# Patient Record
Sex: Male | Born: 1940
Health system: Southern US, Community
[De-identification: ages and names within clinical notes are randomized; demographics above are authoritative.]

## PROBLEM LIST (undated history)

## (undated) DIAGNOSIS — C801 Malignant (primary) neoplasm, unspecified: Secondary | ICD-10-CM

## (undated) DIAGNOSIS — C8590 Non-Hodgkin lymphoma, unspecified, unspecified site: Secondary | ICD-10-CM

## (undated) DIAGNOSIS — I219 Acute myocardial infarction, unspecified: Secondary | ICD-10-CM

## (undated) DIAGNOSIS — I2510 Atherosclerotic heart disease of native coronary artery without angina pectoris: Secondary | ICD-10-CM

## (undated) DIAGNOSIS — E785 Hyperlipidemia, unspecified: Secondary | ICD-10-CM

## (undated) DIAGNOSIS — I517 Cardiomegaly: Secondary | ICD-10-CM

## (undated) DIAGNOSIS — IMO0001 Reserved for inherently not codable concepts without codable children: Secondary | ICD-10-CM

## (undated) DIAGNOSIS — I10 Essential (primary) hypertension: Secondary | ICD-10-CM

## (undated) DIAGNOSIS — R011 Cardiac murmur, unspecified: Secondary | ICD-10-CM

## (undated) DIAGNOSIS — K219 Gastro-esophageal reflux disease without esophagitis: Secondary | ICD-10-CM

## (undated) DIAGNOSIS — N486 Induration penis plastica: Secondary | ICD-10-CM

## (undated) DIAGNOSIS — I700 Atherosclerosis of aorta: Secondary | ICD-10-CM

## (undated) DIAGNOSIS — C61 Malignant neoplasm of prostate: Secondary | ICD-10-CM

## (undated) DIAGNOSIS — H269 Unspecified cataract: Secondary | ICD-10-CM

## (undated) DIAGNOSIS — Z9289 Personal history of other medical treatment: Secondary | ICD-10-CM

## (undated) HISTORY — DX: Atherosclerosis of aorta: I70.0

## (undated) HISTORY — PX: TONSILLECTOMY: SUR1361

## (undated) HISTORY — PX: HEMORROIDECTOMY: SUR656

## (undated) HISTORY — DX: Personal history of other medical treatment: Z92.89

## (undated) HISTORY — DX: Induration penis plastica: N48.6

## (undated) HISTORY — DX: Unspecified cataract: H26.9

## (undated) HISTORY — DX: Hyperlipidemia, unspecified: E78.5

## (undated) HISTORY — PX: OTHER SURGICAL HISTORY: SHX169

## (undated) HISTORY — DX: Atherosclerotic heart disease of native coronary artery without angina pectoris: I25.10

## (undated) HISTORY — DX: Essential (primary) hypertension: I10

## (undated) HISTORY — DX: Acute myocardial infarction, unspecified: I21.9

## (undated) HISTORY — DX: Reserved for inherently not codable concepts without codable children: IMO0001

## (undated) HISTORY — DX: Cardiomegaly: I51.7

## (undated) HISTORY — DX: Malignant neoplasm of prostate: C61

## (undated) HISTORY — DX: Non-Hodgkin lymphoma, unspecified, unspecified site: C85.90

---

## 1990-08-24 HISTORY — PX: CARDIAC CATHETERIZATION: SHX172

## 1996-08-24 DIAGNOSIS — I219 Acute myocardial infarction, unspecified: Secondary | ICD-10-CM

## 1996-08-24 HISTORY — DX: Acute myocardial infarction, unspecified: I21.9

## 1996-08-24 HISTORY — PX: CORONARY ANGIOPLASTY WITH STENT PLACEMENT: SHX49

## 2002-08-24 HISTORY — PX: HERNIA REPAIR: SHX51

## 2008-12-11 ENCOUNTER — Ambulatory Visit: Admission: RE | Admit: 2008-12-11 | Discharge: 2009-01-29 | Payer: Self-pay | Admitting: Radiation Oncology

## 2008-12-19 ENCOUNTER — Ambulatory Visit (HOSPITAL_COMMUNITY): Admission: RE | Admit: 2008-12-19 | Discharge: 2008-12-19 | Payer: Self-pay | Admitting: Urology

## 2009-02-20 ENCOUNTER — Ambulatory Visit: Admission: RE | Admit: 2009-02-20 | Discharge: 2009-05-21 | Payer: Self-pay | Admitting: Radiation Oncology

## 2009-03-04 ENCOUNTER — Encounter: Admission: RE | Admit: 2009-03-04 | Discharge: 2009-03-04 | Payer: Self-pay | Admitting: Urology

## 2009-03-24 HISTORY — PX: PROSTATE SURGERY: SHX751

## 2009-04-03 ENCOUNTER — Ambulatory Visit (HOSPITAL_BASED_OUTPATIENT_CLINIC_OR_DEPARTMENT_OTHER): Admission: RE | Admit: 2009-04-03 | Discharge: 2009-04-03 | Payer: Self-pay | Admitting: Urology

## 2009-04-06 ENCOUNTER — Emergency Department: Payer: Self-pay | Admitting: Internal Medicine

## 2009-08-24 HISTORY — PX: EYE SURGERY: SHX253

## 2010-07-16 ENCOUNTER — Ambulatory Visit: Payer: Self-pay | Admitting: Ophthalmology

## 2010-08-06 ENCOUNTER — Ambulatory Visit: Payer: Self-pay | Admitting: Ophthalmology

## 2010-08-06 HISTORY — PX: CATARACT EXTRACTION: SUR2

## 2010-11-29 LAB — COMPREHENSIVE METABOLIC PANEL
AST: 21 U/L (ref 0–37)
Albumin: 3.8 g/dL (ref 3.5–5.2)
Calcium: 9.6 mg/dL (ref 8.4–10.5)
Chloride: 107 mEq/L (ref 96–112)
Creatinine, Ser: 1.02 mg/dL (ref 0.4–1.5)
GFR calc Af Amer: >60 mL/min (ref >60)
Sodium: 139 mEq/L (ref 135–145)

## 2010-11-29 LAB — CBC
HCT: 42.7 % (ref 39.0–52.0)
Hemoglobin: 14.2 g/dL (ref 13.0–17.0)
MCHC: 33.2 g/dL (ref 30.0–36.0)
Platelets: 227 10*3/uL (ref 150–400)
RBC: 4.44 MIL/uL (ref 4.22–5.81)

## 2010-11-29 LAB — APTT: aPTT: 24 seconds (ref 24–37)

## 2011-01-06 NOTE — Op Note (Signed)
NAME:  Elijah Peters, Elijah Peters             ACCOUNT NO.:  [id number]   MEDICAL RECORD NO.:  [id number]          PATIENT TYPE:  AMB   LOCATION:  [facility]                         FACILITY:  [facility]   PHYSICIAN:  Phelan L. Earlene Plater, M.D.  DATE OF BIRTH:  [birth year]   DATE OF PROCEDURE:  04/03/2009  DATE OF DISCHARGE:                               OPERATIVE REPORT   PREOPERATIVE DIAGNOSIS:  Adenocarcinoma of prostate.   POSTOPERATIVE DIAGNOSIS:  Adenocarcinoma of prostate.   OPERATIVE PROCEDURE:  Transperineal implantation of iodine 125 seed  implants, seat with robotic arm Nucletron, cystourethroscopy and removal  of 2 seeds.   SURGEON:  Lucrezia Starch. Earlene Plater, M.D.   ASSISTANT:  Artist Pais. Kathrynn Running, M.D.   ANESTHESIA:  LMA.   BLOOD LOSS:  20 mL.   TUBES:  16-French Foley.   COMPLICATIONS:  None.   A total of 96 seeds were implanted and 2 removed for a total of 94 seeds  remaining in situ with 29 needles and 62.1120 mCi total apparent  activity.   INDICATIONS FOR PROCEDURE:  [name] is a very nice 70 year old  white male who had localized low risk prostate cancer.  He underwent a  clinical trial on [id number].  It was dutasteride versus placebo.  His exit biopsy on October 31, 2008 revealed 1- of Gleason score 4+3=7  adenocarcinoma in 5% specimen from the right apex.  His PSA had been  quite low at 2.9.  His voiding status was excellent.  After considering  risks, benefits and alternatives, he elected to undergo the above  procedure.  He has been properly simulated and properly informed.   DESCRIPTION OF PROCEDURE:  The patient is placed in the supine position.  After proper LMA anesthesia, was placed in the dorsal lithotomy  position, prepped and draped in a sterile fashion.  A 16-French Foley  catheter was inserted.  The bladder was drained and a B&K biplanar  transrectal ultrasound probe was placed and both axial and sagittal  three-dimensional scanning was performed of the prostate.  The  plan was  then performed again as is documented in the records and we were very  comfortable with the prostate dosing, and we were comfortable with  toxicity-free urethra, rectum and sphincter.  Following this, two  holding needles were placed in unused coordinates.  The base needle was  placed as is documented and implanted and serial implantation was  performed in preplanned coordinates.  A total of 29 needles were used to  implant 96 seeds at 62.1120 mCi total apparent activity.  An  implantation static image was taken fluoroscopically.  We were very  comfortable with seed location.  The wound was dressed after hardware  had been removed.  The patient was placed in the supine position.  The  Foley catheter was removed and scanned.  There were no seeds within it.  There was, however, some blood-tinged urine.  Flexible cystourethroscopy  was performed.  One seed was noted in the prostatic urethra which  flushed into the bladder and there were two seeds noted within the  bladder which were both grasped and removed and  will be submitted to  radiotherapy.  Therefore, there were total of 94 seeds remaining in situ  within the prostate.  Reinspection revealed there was no significant  bleeding.  Efflux of clear urine was noted from normally placed orifices  bilaterally.  The bladder was really smooth walled.  There were no other  lesions noted, although there was significant trilobar hypertrophy.  Flexible cystourethroscope was visually removed.  A 16-French catheter  was inserted.  The bladder was drained.  The patient was taken to the  recovery room stable.      Foch L. Earlene Plater, M.D.  Electronically Signed     RLD/MEDQ  D:  04/03/2009  T:  04/03/2009  Job:  [id number]

## 2011-08-26 DIAGNOSIS — I10 Essential (primary) hypertension: Secondary | ICD-10-CM | POA: Diagnosis not present

## 2011-08-26 DIAGNOSIS — E785 Hyperlipidemia, unspecified: Secondary | ICD-10-CM | POA: Diagnosis not present

## 2011-08-26 DIAGNOSIS — I2510 Atherosclerotic heart disease of native coronary artery without angina pectoris: Secondary | ICD-10-CM | POA: Diagnosis not present

## 2011-09-01 DIAGNOSIS — I2510 Atherosclerotic heart disease of native coronary artery without angina pectoris: Secondary | ICD-10-CM | POA: Diagnosis not present

## 2011-09-01 HISTORY — PX: TRANSTHORACIC ECHOCARDIOGRAM: SHX275

## 2011-09-10 DIAGNOSIS — E785 Hyperlipidemia, unspecified: Secondary | ICD-10-CM | POA: Diagnosis not present

## 2011-09-10 DIAGNOSIS — I10 Essential (primary) hypertension: Secondary | ICD-10-CM | POA: Diagnosis not present

## 2011-09-10 DIAGNOSIS — R5383 Other fatigue: Secondary | ICD-10-CM | POA: Diagnosis not present

## 2011-09-21 DIAGNOSIS — C61 Malignant neoplasm of prostate: Secondary | ICD-10-CM | POA: Diagnosis not present

## 2011-09-23 DIAGNOSIS — H5000 Unspecified esotropia: Secondary | ICD-10-CM | POA: Diagnosis not present

## 2011-10-06 DIAGNOSIS — I119 Hypertensive heart disease without heart failure: Secondary | ICD-10-CM | POA: Diagnosis not present

## 2011-10-06 DIAGNOSIS — I359 Nonrheumatic aortic valve disorder, unspecified: Secondary | ICD-10-CM | POA: Diagnosis not present

## 2011-10-06 DIAGNOSIS — I2510 Atherosclerotic heart disease of native coronary artery without angina pectoris: Secondary | ICD-10-CM | POA: Diagnosis not present

## 2011-10-06 DIAGNOSIS — E785 Hyperlipidemia, unspecified: Secondary | ICD-10-CM | POA: Diagnosis not present

## 2011-10-28 DIAGNOSIS — E7800 Pure hypercholesterolemia, unspecified: Secondary | ICD-10-CM | POA: Diagnosis not present

## 2011-10-28 DIAGNOSIS — I2510 Atherosclerotic heart disease of native coronary artery without angina pectoris: Secondary | ICD-10-CM | POA: Diagnosis not present

## 2011-10-28 DIAGNOSIS — I10 Essential (primary) hypertension: Secondary | ICD-10-CM | POA: Diagnosis not present

## 2011-11-06 DIAGNOSIS — H35359 Cystoid macular degeneration, unspecified eye: Secondary | ICD-10-CM | POA: Diagnosis not present

## 2011-11-12 DIAGNOSIS — D239 Other benign neoplasm of skin, unspecified: Secondary | ICD-10-CM | POA: Diagnosis not present

## 2011-11-12 DIAGNOSIS — L820 Inflamed seborrheic keratosis: Secondary | ICD-10-CM | POA: Diagnosis not present

## 2011-11-12 DIAGNOSIS — D485 Neoplasm of uncertain behavior of skin: Secondary | ICD-10-CM | POA: Diagnosis not present

## 2011-11-12 DIAGNOSIS — L821 Other seborrheic keratosis: Secondary | ICD-10-CM | POA: Diagnosis not present

## 2011-11-12 DIAGNOSIS — L570 Actinic keratosis: Secondary | ICD-10-CM | POA: Diagnosis not present

## 2011-12-17 DIAGNOSIS — D0460 Carcinoma in situ of skin of unspecified upper limb, including shoulder: Secondary | ICD-10-CM | POA: Diagnosis not present

## 2011-12-17 DIAGNOSIS — L570 Actinic keratosis: Secondary | ICD-10-CM | POA: Diagnosis not present

## 2011-12-18 DIAGNOSIS — K219 Gastro-esophageal reflux disease without esophagitis: Secondary | ICD-10-CM | POA: Diagnosis not present

## 2012-01-04 DIAGNOSIS — K2940 Chronic atrophic gastritis without bleeding: Secondary | ICD-10-CM | POA: Diagnosis not present

## 2012-01-04 DIAGNOSIS — K648 Other hemorrhoids: Secondary | ICD-10-CM | POA: Diagnosis not present

## 2012-01-04 DIAGNOSIS — K2990 Gastroduodenitis, unspecified, without bleeding: Secondary | ICD-10-CM | POA: Diagnosis not present

## 2012-01-04 DIAGNOSIS — R12 Heartburn: Secondary | ICD-10-CM | POA: Diagnosis not present

## 2012-01-04 DIAGNOSIS — K5730 Diverticulosis of large intestine without perforation or abscess without bleeding: Secondary | ICD-10-CM | POA: Diagnosis not present

## 2012-01-04 DIAGNOSIS — Z1211 Encounter for screening for malignant neoplasm of colon: Secondary | ICD-10-CM | POA: Diagnosis not present

## 2012-01-04 LAB — HM COLONOSCOPY

## 2012-03-28 DIAGNOSIS — C61 Malignant neoplasm of prostate: Secondary | ICD-10-CM | POA: Insufficient documentation

## 2012-04-28 DIAGNOSIS — I2510 Atherosclerotic heart disease of native coronary artery without angina pectoris: Secondary | ICD-10-CM | POA: Diagnosis not present

## 2012-04-28 DIAGNOSIS — M549 Dorsalgia, unspecified: Secondary | ICD-10-CM | POA: Diagnosis not present

## 2012-04-28 DIAGNOSIS — M129 Arthropathy, unspecified: Secondary | ICD-10-CM | POA: Diagnosis not present

## 2012-04-28 DIAGNOSIS — I10 Essential (primary) hypertension: Secondary | ICD-10-CM | POA: Diagnosis not present

## 2012-05-03 DIAGNOSIS — E785 Hyperlipidemia, unspecified: Secondary | ICD-10-CM | POA: Diagnosis not present

## 2012-05-03 DIAGNOSIS — I10 Essential (primary) hypertension: Secondary | ICD-10-CM | POA: Diagnosis not present

## 2012-05-06 DIAGNOSIS — H35359 Cystoid macular degeneration, unspecified eye: Secondary | ICD-10-CM | POA: Diagnosis not present

## 2012-06-24 DIAGNOSIS — Z9289 Personal history of other medical treatment: Secondary | ICD-10-CM

## 2012-06-24 HISTORY — DX: Personal history of other medical treatment: Z92.89

## 2012-06-30 DIAGNOSIS — I2510 Atherosclerotic heart disease of native coronary artery without angina pectoris: Secondary | ICD-10-CM | POA: Diagnosis not present

## 2012-06-30 DIAGNOSIS — I10 Essential (primary) hypertension: Secondary | ICD-10-CM | POA: Diagnosis not present

## 2012-06-30 DIAGNOSIS — Z9861 Coronary angioplasty status: Secondary | ICD-10-CM | POA: Diagnosis not present

## 2012-06-30 DIAGNOSIS — E782 Mixed hyperlipidemia: Secondary | ICD-10-CM | POA: Diagnosis not present

## 2012-07-06 DIAGNOSIS — E785 Hyperlipidemia, unspecified: Secondary | ICD-10-CM | POA: Diagnosis not present

## 2012-07-12 DIAGNOSIS — E785 Hyperlipidemia, unspecified: Secondary | ICD-10-CM | POA: Diagnosis not present

## 2012-07-12 DIAGNOSIS — I119 Hypertensive heart disease without heart failure: Secondary | ICD-10-CM | POA: Diagnosis not present

## 2012-07-12 DIAGNOSIS — I359 Nonrheumatic aortic valve disorder, unspecified: Secondary | ICD-10-CM | POA: Diagnosis not present

## 2012-07-12 DIAGNOSIS — I2510 Atherosclerotic heart disease of native coronary artery without angina pectoris: Secondary | ICD-10-CM | POA: Diagnosis not present

## 2012-10-03 DIAGNOSIS — R972 Elevated prostate specific antigen [PSA]: Secondary | ICD-10-CM | POA: Diagnosis not present

## 2012-10-03 DIAGNOSIS — N476 Balanoposthitis: Secondary | ICD-10-CM | POA: Diagnosis not present

## 2012-10-03 DIAGNOSIS — C61 Malignant neoplasm of prostate: Secondary | ICD-10-CM | POA: Diagnosis not present

## 2012-11-02 DIAGNOSIS — Z1331 Encounter for screening for depression: Secondary | ICD-10-CM | POA: Diagnosis not present

## 2012-11-14 DIAGNOSIS — Z85828 Personal history of other malignant neoplasm of skin: Secondary | ICD-10-CM | POA: Diagnosis not present

## 2012-11-14 DIAGNOSIS — L570 Actinic keratosis: Secondary | ICD-10-CM | POA: Diagnosis not present

## 2012-11-14 DIAGNOSIS — L821 Other seborrheic keratosis: Secondary | ICD-10-CM | POA: Diagnosis not present

## 2012-11-14 DIAGNOSIS — L578 Other skin changes due to chronic exposure to nonionizing radiation: Secondary | ICD-10-CM | POA: Diagnosis not present

## 2012-11-14 DIAGNOSIS — L820 Inflamed seborrheic keratosis: Secondary | ICD-10-CM | POA: Diagnosis not present

## 2012-11-14 DIAGNOSIS — D1800 Hemangioma unspecified site: Secondary | ICD-10-CM | POA: Diagnosis not present

## 2012-12-01 DIAGNOSIS — E782 Mixed hyperlipidemia: Secondary | ICD-10-CM | POA: Diagnosis not present

## 2012-12-01 DIAGNOSIS — I2510 Atherosclerotic heart disease of native coronary artery without angina pectoris: Secondary | ICD-10-CM | POA: Diagnosis not present

## 2012-12-28 DIAGNOSIS — L570 Actinic keratosis: Secondary | ICD-10-CM | POA: Diagnosis not present

## 2013-01-11 DIAGNOSIS — I10 Essential (primary) hypertension: Secondary | ICD-10-CM | POA: Diagnosis not present

## 2013-01-11 DIAGNOSIS — Z1331 Encounter for screening for depression: Secondary | ICD-10-CM | POA: Diagnosis not present

## 2013-01-11 DIAGNOSIS — E7800 Pure hypercholesterolemia, unspecified: Secondary | ICD-10-CM | POA: Diagnosis not present

## 2013-01-11 DIAGNOSIS — I2510 Atherosclerotic heart disease of native coronary artery without angina pectoris: Secondary | ICD-10-CM | POA: Diagnosis not present

## 2013-04-21 ENCOUNTER — Encounter: Payer: Self-pay | Admitting: *Deleted

## 2013-04-28 ENCOUNTER — Encounter: Payer: Self-pay | Admitting: Cardiovascular Disease

## 2013-04-28 ENCOUNTER — Ambulatory Visit (INDEPENDENT_AMBULATORY_CARE_PROVIDER_SITE_OTHER): Payer: Medicare Other | Admitting: Cardiovascular Disease

## 2013-04-28 VITALS — BP 120/60 | HR 55 | Ht 70.5 in | Wt 190.6 lb

## 2013-04-28 DIAGNOSIS — E785 Hyperlipidemia, unspecified: Secondary | ICD-10-CM | POA: Diagnosis not present

## 2013-04-28 DIAGNOSIS — I358 Other nonrheumatic aortic valve disorders: Secondary | ICD-10-CM

## 2013-04-28 DIAGNOSIS — I10 Essential (primary) hypertension: Secondary | ICD-10-CM

## 2013-04-28 DIAGNOSIS — I2510 Atherosclerotic heart disease of native coronary artery without angina pectoris: Secondary | ICD-10-CM | POA: Diagnosis not present

## 2013-04-28 DIAGNOSIS — I359 Nonrheumatic aortic valve disorder, unspecified: Secondary | ICD-10-CM

## 2013-04-28 NOTE — Patient Instructions (Addendum)
Your physician has requested that you have an echocardiogram. Echocardiography is a painless test that uses sound waves to create images of your heart. It provides your doctor with information about the size and shape of your heart and how well your heart's chambers and valves are working. This procedure takes approximately one hour. There are no restrictions for this procedure. This will be done in 6 months.  Your physician recommends that you return for lab work in: 6 MONTHS FASTING .  Your physician recommends that you schedule a follow-up appointment in: 6 MONTHS.

## 2013-04-29 ENCOUNTER — Encounter: Payer: Self-pay | Admitting: Cardiovascular Disease

## 2013-04-29 DIAGNOSIS — I2510 Atherosclerotic heart disease of native coronary artery without angina pectoris: Secondary | ICD-10-CM | POA: Insufficient documentation

## 2013-04-29 DIAGNOSIS — E785 Hyperlipidemia, unspecified: Secondary | ICD-10-CM | POA: Insufficient documentation

## 2013-04-29 DIAGNOSIS — I358 Other nonrheumatic aortic valve disorders: Secondary | ICD-10-CM | POA: Insufficient documentation

## 2013-04-29 DIAGNOSIS — I10 Essential (primary) hypertension: Secondary | ICD-10-CM | POA: Insufficient documentation

## 2013-04-29 NOTE — Progress Notes (Signed)
Patient ID: Elijah Peters, male   DOB: Jul 10, 1941, 72 y.o.   MRN: [id number]     HPI: Elijah Peters, is a 72 y.o. male who presents to the office for a six-month cardiology followup evaluation  Elijah Peters is a 72 year old gentleman with established coronary artery disease and underwent initial PCI to his LAD in 2 while living in [place]. In January 1998 I placed 2 stents in his left circumflex coronary artery. he has documented mild aortic valve sclerosis, mild concentric left trickle hypertrophy and normal systolic and diastolic function. Has a history of hyperlipidemia requiring combination therapy and mild hypertension. He denies recent development of chest pain. He denies shortness of breath. He remains active. He walks approximately 2-3 miles per day. His last echo Doppler study was in January 2013. His last nuclear perfusion study was in November 2013 and continued to show normal perfusion.  Past Medical History  Diagnosis Date  . CAD (coronary artery disease)   . LVH (left ventricular hypertrophy)     mild, concentric  . Mild aortic sclerosis   . Peyronie's disease   . Hypertension   . Myocardial infarction 08/1996    non-Q-wave inferolateral   . Hyperlipidemia   . Prostate cancer   . History of nuclear stress test 06/2012    normal pattern of perfusion; low risk scan    Past Surgical History  Procedure Laterality Date  . Cardiac catheterization  1992    PTCA to LAD, in [place]  . Coronary angioplasty with stent placement  1998    L circumflex - 3.0x23.9x9 bare metal stent  . Transthoracic echocardiogram  09/01/2011    EF=>55%; mild conc LVH; borderline RV enlargement; LA mildly dilated; mild mitral annular calcif; mild-mod MR; RV systolic pressure elevated; AV mildly sclerotic; mild AV regurg; aortic root sclerosis/calcif    Allergies  Allergen Reactions  . Niacin And Related     Higher doses of Niaspan    Current Outpatient Prescriptions  Medication Sig  Dispense Refill  . aspirin 325 MG tablet Take 325 mg by mouth daily.      . COD LIVER OIL PO Take 415 mg by mouth 3 (three) times daily.      . Coenzyme Q10 (COQ10) 200 MG CAPS Take 200 mg by mouth daily.      Marland Kitchen ezetimibe (ZETIA) 10 MG tablet Take 10 mg by mouth daily.      . metoprolol succinate (TOPROL-XL) 50 MG 24 hr tablet Take 50 mg by mouth daily. Take with or immediately following a meal.      . omega-3 acid ethyl esters (LOVAZA) 1 G capsule Take 1 g by mouth 4 (four) times daily.      . ramipril (ALTACE) 10 MG capsule Take 10 mg by mouth daily.      . ranitidine (ZANTAC) 300 MG tablet Take 300 mg by mouth daily as needed for heartburn.      . rosuvastatin (CRESTOR) 20 MG tablet Take 20 mg by mouth daily.       No current facility-administered medications for this visit.    History   Social History  . Marital Status: Married    Spouse Name: N/A    Number of Children: 4  . Years of Education: N/A   Occupational History  . retired [employer]   Social History Main Topics  . Smoking status: Former Smoker -- 9 years    Quit date: 08/25/1963  . Smokeless tobacco: Never Used  . Alcohol Use: 3.5  oz/week    7 drink(s) per week  . Drug Use: Not on file  . Sexual Activity: Not on file   Other Topics Concern  . Not on file   Social History Narrative  . No narrative on file    Family History  Problem Relation Age of Onset  . Emphysema Father   . Stroke Mother   . Hypertension Brother   . Hyperlipidemia Brother   . Heart disease Brother     CAD   Socially he is married has 4 children 5 grandchildren. He is retired. He walks daily. He does take an occasional glass of wine.  ROS is negative for fevers, chills or night sweats. He denies shortness of breath. He denies palpitations. He denies PND orthopnea. There is no wheezing. There is no chest pain. He denies change in bowel or bladder habits time she does have some GERD and rarely takes Zantac. He denies edema. He  denies paresthesias. There is no claudication. Other system review is negative.  PE BP 120/60  Pulse 55  Ht 5' 10.5" (1.791 m)  Wt 190 lb 9.6 oz (86.456 kg)  BMI 26.95 kg/m2  General: Alert, oriented, no distress.  Skin: normal turgor, no rashes HEENT: Normocephalic, atraumatic. Pupils round and reactive; sclera anicteric;no lid lag.  Nose without nasal septal hypertrophy Mouth/Parynx benign; Mallinpatti scale 2 Neck: No JVD, no carotid briuts Lungs: clear to ausculatation and percussion; no wheezing or rales Heart: RRR, s1 s2 normal 2/6 systolic murmur in the aortic area compatible with his aortic sclerosis. Abdomen: soft, nontender; no hepatosplenomehaly, BS+; abdominal aorta nontender and not dilated by palpation. Pulses 2+ Extremities: no clubbing cyanosis or edema, Homan's sign negative  Neurologic: grossly nonfocal  ECG: Sinus rhythm at 55 beats per minute.  LABS:  BMET    Component Value Date/Time   NA 139 03/26/2009 1120   K 4.7 03/26/2009 1120   CL 107 03/26/2009 1120   CO2 29 03/26/2009 1120   GLUCOSE 91 03/26/2009 1120   BUN 17 03/26/2009 1120   CREATININE 1.02 03/26/2009 1120   CALCIUM 9.6 03/26/2009 1120   GFRNONAA >60 03/26/2009 1120   GFRAA  Value: >60        The eGFR has been calculated using the MDRD equation. This calculation has not been validated in all clinical situations. eGFR's persistently <60 mL/min signify possible Chronic Kidney Disease. 03/26/2009 1120     Hepatic Function Panel     Component Value Date/Time   PROT 6.8 03/26/2009 1120   ALBUMIN 3.8 03/26/2009 1120   AST 21 03/26/2009 1120   ALT 19 03/26/2009 1120   ALKPHOS 54 03/26/2009 1120   BILITOT 1.0 03/26/2009 1120     CBC    Component Value Date/Time   WBC 6.4 03/26/2009 1120   RBC 4.44 03/26/2009 1120   HGB 14.2 03/26/2009 1120   HCT 42.7 03/26/2009 1120   PLT 227 03/26/2009 1120   MCV 96.1 03/26/2009 1120   MCHC 33.2 03/26/2009 1120   RDW 12.8 03/26/2009 1120     BNP No results found for this basename: probnp      Lipid Panel  No results found for this basename: chol, trig, hdl, cholhdl, vldl, ldlcalc     RADIOLOGY: No results found.    ASSESSMENT AND PLAN: Elijah Peters continues to be stable from a cardiac perspective. He is now 22 years following initial intervention to his LAD and 14-1/2 years following successful stenting with bare-metal stents to his left circumflex  coronary artery. Laboratory in March 2014 showed an LDL particle numbers 737 total cholesterol 75 triglycerides 58 and HDL particle number low at 29.2. He's not having any anginal symptoms. He denies shortness of breath. He continues to exercise regularly. I will see him in 6 months for followup evaluation prior to that office visit he will undergo a two-year followup echo Doppler study to further evaluate his aortic valve and he also will undergo complete set of laboratory.     Lennette Bihari, MD, [facility]  04/29/2013 10:58 AM

## 2013-05-01 DIAGNOSIS — C61 Malignant neoplasm of prostate: Secondary | ICD-10-CM | POA: Diagnosis not present

## 2013-05-17 DIAGNOSIS — Z79899 Other long term (current) drug therapy: Secondary | ICD-10-CM | POA: Diagnosis not present

## 2013-05-17 DIAGNOSIS — E7800 Pure hypercholesterolemia, unspecified: Secondary | ICD-10-CM | POA: Diagnosis not present

## 2013-05-17 DIAGNOSIS — I2510 Atherosclerotic heart disease of native coronary artery without angina pectoris: Secondary | ICD-10-CM | POA: Diagnosis not present

## 2013-05-17 DIAGNOSIS — I10 Essential (primary) hypertension: Secondary | ICD-10-CM | POA: Diagnosis not present

## 2013-05-30 DIAGNOSIS — L570 Actinic keratosis: Secondary | ICD-10-CM | POA: Diagnosis not present

## 2013-05-30 DIAGNOSIS — L578 Other skin changes due to chronic exposure to nonionizing radiation: Secondary | ICD-10-CM | POA: Diagnosis not present

## 2013-05-30 DIAGNOSIS — L820 Inflamed seborrheic keratosis: Secondary | ICD-10-CM | POA: Diagnosis not present

## 2013-05-30 DIAGNOSIS — Z85828 Personal history of other malignant neoplasm of skin: Secondary | ICD-10-CM | POA: Diagnosis not present

## 2013-05-30 DIAGNOSIS — D1800 Hemangioma unspecified site: Secondary | ICD-10-CM | POA: Diagnosis not present

## 2013-05-30 DIAGNOSIS — L821 Other seborrheic keratosis: Secondary | ICD-10-CM | POA: Diagnosis not present

## 2013-06-27 ENCOUNTER — Telehealth: Payer: Self-pay | Admitting: Cardiovascular Disease

## 2013-06-27 NOTE — Telephone Encounter (Signed)
Charmaine w/ [facility] called.  Stated pt needs a review for Crestor and wanted to do a verbal over the phone.  Stated form was faxed w/o response and must be handled today or Rx will be sent back w/o filling.  Stated pt's plan covers simvastatin and atorvastatin if he can be switched to one of them or the form will need to be completed and faxed back today or call for review by 4pm today.    Phone: [phone]   Reference number: [id number]  Message forwarded to Dr. Pierre Bali, CMA.  This note printed and placed on Dr. Landry Dyke cart for review.

## 2013-06-28 ENCOUNTER — Other Ambulatory Visit: Payer: Self-pay | Admitting: *Deleted

## 2013-06-28 DIAGNOSIS — E782 Mixed hyperlipidemia: Secondary | ICD-10-CM

## 2013-06-28 DIAGNOSIS — Z79899 Other long term (current) drug therapy: Secondary | ICD-10-CM

## 2013-06-28 MED ORDER — ATORVASTATIN CALCIUM 40 MG PO TABS: 40.0000 mg | ORAL_TABLET | Freq: Every day | ORAL | Status: DC

## 2013-06-28 NOTE — Telephone Encounter (Signed)
Review was not done yesterday. Dr. Tresa Endo was seeing patients in clinic and unable to stop to do review.

## 2013-06-28 NOTE — Telephone Encounter (Signed)
Pharmacy was calling for review to approve Crestor yesterday.  Will need to call in new Rx if form not completed and faxed back yesterday.    Message forwarded to W. Waddell, CMA.

## 2013-06-28 NOTE — Telephone Encounter (Signed)
If pt can tolerate atorvastatin, can switch to 40 mg; otherwise, if can't then continue crestor at 20 mg

## 2013-06-28 NOTE — Telephone Encounter (Signed)
Spoke with patient after reviewing Dr. Landry Dyke message to question whether he has ever taken atorvastatin in the past. He said no and is willing to try it. Informed patient that I will sent the prescription to his preferred pharmacy and labslips to have followup bloodwork in 6-8 weeks,

## 2013-10-05 ENCOUNTER — Encounter: Payer: Self-pay | Admitting: *Deleted

## 2013-10-05 ENCOUNTER — Other Ambulatory Visit: Payer: Self-pay | Admitting: *Deleted

## 2013-10-05 DIAGNOSIS — I2510 Atherosclerotic heart disease of native coronary artery without angina pectoris: Secondary | ICD-10-CM

## 2013-10-16 ENCOUNTER — Other Ambulatory Visit: Payer: Self-pay | Admitting: Cardiovascular Disease

## 2013-10-16 DIAGNOSIS — Z79899 Other long term (current) drug therapy: Secondary | ICD-10-CM | POA: Diagnosis not present

## 2013-10-16 DIAGNOSIS — E782 Mixed hyperlipidemia: Secondary | ICD-10-CM | POA: Diagnosis not present

## 2013-10-16 DIAGNOSIS — I2510 Atherosclerotic heart disease of native coronary artery without angina pectoris: Secondary | ICD-10-CM | POA: Diagnosis not present

## 2013-10-17 LAB — CBC WITH DIFFERENTIAL
BASOS ABS: 0 10*3/uL (ref 0.0–0.2)
Basos: 0 %
EOS ABS: 0.1 10*3/uL (ref 0.0–0.4)
Eos: 2 %
HCT: 44.3 % (ref 37.5–51.0)
Hemoglobin: 15.1 g/dL (ref 12.6–17.7)
IMMATURE GRANS (ABS): 0 10*3/uL (ref 0.0–0.1)
IMMATURE GRANULOCYTES: 0 %
LYMPHS ABS: 2.1 10*3/uL (ref 0.7–3.1)
Lymphs: 37 %
MCH: 31.9 pg (ref 26.6–33.0)
MCHC: 34.1 g/dL (ref 31.5–35.7)
MCV: 94 fL (ref 79–97)
MONOS ABS: 0.7 10*3/uL (ref 0.1–0.9)
Monocytes: 12 %
NEUTROS PCT: 49 %
Neutrophils Absolute: 2.7 10*3/uL (ref 1.4–7.0)
PLATELETS: 187 10*3/uL (ref 150–379)
RBC: 4.73 x10E6/uL (ref 4.14–5.80)
RDW: 13 % (ref 12.3–15.4)
WBC: 5.6 10*3/uL (ref 3.4–10.8)

## 2013-10-17 LAB — COMPREHENSIVE METABOLIC PANEL
ALK PHOS: 88 IU/L (ref 39–117)
ALT: 20 IU/L (ref 0–44)
AST: 22 IU/L (ref 0–40)
Albumin/Globulin Ratio: 1.7 (ref 1.1–2.5)
Albumin: 4 g/dL (ref 3.5–4.8)
BUN / CREAT RATIO: 15 (ref 10–22)
BUN: 14 mg/dL (ref 8–27)
CALCIUM: 9.2 mg/dL (ref 8.6–10.2)
CO2: 24 mmol/L (ref 18–29)
CREATININE: 0.91 mg/dL (ref 0.76–1.27)
Chloride: 103 mmol/L (ref 97–108)
GFR calc Af Amer: 96 mL/min/{1.73_m2} (ref >59)
GFR calc non Af Amer: 83 mL/min/{1.73_m2} (ref >59)
GLOBULIN, TOTAL: 2.3 g/dL (ref 1.5–4.5)
Glucose: 102 mg/dL — ABNORMAL HIGH (ref 65–99)
POTASSIUM: 4.7 mmol/L (ref 3.5–5.2)
SODIUM: 141 mmol/L (ref 134–144)
Total Bilirubin: 0.7 mg/dL (ref 0.0–1.2)
Total Protein: 6.3 g/dL (ref 6.0–8.5)

## 2013-10-17 LAB — LIPID PANEL W/O CHOL/HDL RATIO
Cholesterol, Total: 104 mg/dL (ref 100–199)
HDL: 35 mg/dL — AB (ref >39)
LDL Calculated: 54 mg/dL (ref 0–99)
TRIGLYCERIDES: 73 mg/dL (ref 0–149)
VLDL Cholesterol Cal: 15 mg/dL (ref 5–40)

## 2013-10-17 LAB — HEPATIC FUNCTION PANEL: BILIRUBIN DIRECT: 0.15 mg/dL (ref 0.00–0.40)

## 2013-10-31 ENCOUNTER — Encounter: Payer: Self-pay | Admitting: Cardiovascular Disease

## 2013-10-31 ENCOUNTER — Ambulatory Visit (INDEPENDENT_AMBULATORY_CARE_PROVIDER_SITE_OTHER): Payer: Medicare Other | Admitting: Cardiovascular Disease

## 2013-10-31 VITALS — BP 142/66 | Ht 70.5 in | Wt 191.4 lb

## 2013-10-31 DIAGNOSIS — I359 Nonrheumatic aortic valve disorder, unspecified: Secondary | ICD-10-CM

## 2013-10-31 DIAGNOSIS — I2510 Atherosclerotic heart disease of native coronary artery without angina pectoris: Secondary | ICD-10-CM | POA: Diagnosis not present

## 2013-10-31 DIAGNOSIS — E785 Hyperlipidemia, unspecified: Secondary | ICD-10-CM

## 2013-10-31 DIAGNOSIS — I358 Other nonrheumatic aortic valve disorders: Secondary | ICD-10-CM

## 2013-10-31 DIAGNOSIS — I10 Essential (primary) hypertension: Secondary | ICD-10-CM

## 2013-10-31 NOTE — Progress Notes (Signed)
Patient ID: Elijah Peters, male   DOB: [birth year], 73 y.o.   MRN: [id number]      HPI: Elijah Peters is a 73 y.o. male who presents to the office for a six-month cardiology followup evaluation  [name] is a 73 year old gentleman with established coronary artery disease and underwent initial PCI to his LAD in 65 while living in [place]. In January 1998, he underwent insertion by me of  2 stents in his left circumflex coronary artery. He has documented mild aortic valve sclerosis, mild concentric LV hypertrophy and normal systolic and diastolic function. Has a history of hyperlipidemia requiring combination therapy and mild hypertension. He denies recent development of chest pain. He denies shortness of breath. He remains active. He walks approximately 2-3 miles per day. His last echo Doppler study was in January 2013. His last nuclear perfusion study was in November 2013 and continued to show normal perfusion.  Do to issues with insurance, we switched his Crestor to atorvastatin 40 mg and he continues to take Zetia 10 mg. He recently underwent followup laboratory which shows a total cholesterol of 104 triglycerides 73 HDL cholesterol 35 LDL cholesterol 54 and VLDL cholesterol 13. In normal chemistry profile was normal glucose was minimally increased at 102. CBC was normal.  He continues to be active. He denies any exertionally precipitated chest tightness. He denies PND or orthopnea. He is unaware of palpitations. He brought with him today a PDF  file of his living will.  Past Medical History  Diagnosis Date  . CAD (coronary artery disease)   . LVH (left ventricular hypertrophy)     mild, concentric  . Mild aortic sclerosis   . Peyronie's disease   . Hypertension   . Myocardial infarction 08/1996    non-Q-wave inferolateral   . Hyperlipidemia   . Prostate cancer   . History of nuclear stress test 06/2012    normal pattern of perfusion; low risk scan    Past Surgical History    Procedure Laterality Date  . Cardiac catheterization  1992    PTCA to LAD, in [place]  . Coronary angioplasty with stent placement  1998    L circumflex - 3.0x23.9x9 bare metal stent  . Transthoracic echocardiogram  09/01/2011    EF=>55%; mild conc LVH; borderline RV enlargement; LA mildly dilated; mild mitral annular calcif; mild-mod MR; RV systolic pressure elevated; AV mildly sclerotic; mild AV regurg; aortic root sclerosis/calcif    Allergies  Allergen Reactions  . Niacin And Related     Higher doses of Niaspan    Current Outpatient Prescriptions  Medication Sig Dispense Refill  . aspirin 325 MG tablet Take 325 mg by mouth daily.      Marland Kitchen atorvastatin (LIPITOR) 40 MG tablet Take 1 tablet (40 mg total) by mouth daily.  90 tablet  3  . COD LIVER OIL PO Take 415 mg by mouth 3 (three) times daily.      . Coenzyme Q10 (COQ10) 200 MG CAPS Take 200 mg by mouth daily.      Marland Kitchen ezetimibe (ZETIA) 10 MG tablet Take 10 mg by mouth daily.      . metoprolol succinate (TOPROL-XL) 50 MG 24 hr tablet Take 50 mg by mouth daily. Take with or immediately following a meal.      . omega-3 acid ethyl esters (LOVAZA) 1 G capsule Take 1 g by mouth 4 (four) times daily.      . Potassium 99 MG TABS Take 1 tablet by mouth daily.      Marland Kitchen  ramipril (ALTACE) 10 MG capsule Take 10 mg by mouth daily.       No current facility-administered medications for this visit.    History   Social History  . Marital Status: Married    Spouse Name: N/A    Number of Children: 4  . Years of Education: N/A   Occupational History  . retired [employer] Top[employer]  . Smoking status: Former Smoker -- 9 years    Quit date: 08/25/1963  . Smokeless tobacco: Never Used  . Alcohol Use: 3.5 oz/week    7 drink(s) per week  . Drug Use: Not on file  . Sexual Activity: Not on file   Other Topics Concern  . Not on file   Social History Narrative  . No narrative on file    Family History  Problem  Relation Age of Onset  . Emphysema Father   . Stroke Mother   . Hypertension Brother   . Hyperlipidemia Brother   . Heart disease Brother     CAD   Socially he is married has 4 children 5 grandchildren. He is retired. He walks daily. He does take an occasional glass of wine.  ROS is negative for fevers, chills or night sweats. He denies any change in weight. He denies visual changes. There are no changes in hearing. He is unaware of lymphadenopathy. He denies shortness of breath. He denies palpitations. He denies PND orthopnea. There is no wheezing. There is no chest pain. He denies change in bowel or bladder habits. He does have some GERD and rarely takes Zantac. He denies edema. He denies paresthesias. There is no claudication. There are no myalgias and tolerates atorvastatin.  He difficulty with sleep Other comprehensive 14 point system review is negative.  PE BP 142/66  Ht 5' 10.5" (1.791 m)  Wt 191 lb 6.4 oz (86.818 kg)  BMI 27.07 kg/m2  General: Alert, oriented, no distress.  Skin: normal turgor, no rashes HEENT: Normocephalic, atraumatic. Pupils round and reactive; sclera anicteric;no lid lag. No xanthelasma Nose without nasal septal hypertrophy Mouth/Parynx benign; Mallinpatti scale 2 Neck: No JVD, no carotid briuts Chest wall: No tenderness to palpation Lungs: clear to ausculatation and percussion; no wheezing or rales Heart: RRR, s1 s2 normal 2/6 systolic murmur in the aortic area compatible with his aortic sclerosis. No S3 or S4 gallop. No rubs thrills or heaves to Abdomen: soft, nontender; no hepatosplenomehaly, BS+; abdominal aorta nontender and not dilated by palpation. Back: No CVA Pulses 2+ Musculoskeletal: No joint tenderness Extremities: no clubbing cyanosis or edema, Homan's sign negative  Neurologic: grossly nonfocal Psychological: Normal affect and mood  ECG (independently read by me): Normal sinus rhythm at 61 beats per minute. Normal intervals. Q waves in III  and F.  Prior 04/28/13 ECG: Sinus rhythm at 55 beats per minute.  LABS:  BMET    Component Value Date/Time   NA 141 10/16/2013 0825   NA 139 03/26/2009 1120   K 4.7 10/16/2013 0825   CL 103 10/16/2013 0825   CO2 24 10/16/2013 0825   GLUCOSE 102* 10/16/2013 0825   GLUCOSE 91 03/26/2009 1120   BUN 14 10/16/2013 0825   BUN 17 03/26/2009 1120   CREATININE 0.91 10/16/2013 0825   CALCIUM 9.2 10/16/2013 0825   GFRNONAA 83 10/16/2013 0825   GFRAA 96 10/16/2013 0825     Hepatic Function Panel     Component Value Date/Time   PROT 6.3 10/16/2013 0825   PROT 6.8  03/26/2009 1120   ALBUMIN 3.8 03/26/2009 1120   AST 22 10/16/2013 0825   ALT 20 10/16/2013 0825   ALKPHOS 88 10/16/2013 0825   BILITOT 0.7 10/16/2013 0825   BILIDIR 0.15 10/16/2013 0825     CBC    Component Value Date/Time   WBC 5.6 10/16/2013 0825   WBC 6.4 03/26/2009 1120   RBC 4.73 10/16/2013 0825   RBC 4.44 03/26/2009 1120   HGB 15.1 10/16/2013 0825   HCT 44.3 10/16/2013 0825   PLT 187 10/16/2013 0825   MCV 94 10/16/2013 0825   MCH 31.9 10/16/2013 0825   MCHC 34.1 10/16/2013 0825   MCHC 33.2 03/26/2009 1120   RDW 13.0 10/16/2013 0825   RDW 12.8 03/26/2009 1120   LYMPHSABS 2.1 10/16/2013 0825   EOSABS 0.1 10/16/2013 0825   BASOSABS 0.0 10/16/2013 0825     BNP No results found for this basename: probnp    Lipid Panel  No results found for this basename: chol,  trig,  hdl,  cholhdl,  vldl,  ldlcalc     RADIOLOGY: No results found.    ASSESSMENT AND PLAN: Mr. Ronan continues to be stable from a cardiac perspective. He is now 23 years following initial intervention to his LAD and >15 years following successful stenting with bare-metal stents to his left circumflex coronary artery. Laboratory in March 2014 showed an LDL particle numbers 737 total cholesterol 75 triglycerides 58 and HDL particle number low at 29.2. He's not having any anginal symptoms. He denies shortness of breath. He continues to exercise regularly. He has tolerated the  change from Crestor to atorvastatin due to insurance issues. Most recent lipid panel was reviewed with him and this remains excellent. Tolerating this without myalgias. He also still on Zetia. Blood pressure today was improved when checked by me at 128/70. He does have aortic valve sclerosis which appears unchanged. I recommended that he reduce his aspirin from 325 mg to 81 mg. We did download his PDF file of his living will and will make a copy of this to place in his chart. I will see him in 6 months for cardiology reevaluation   Time spent: 25 minutes  Troy Sine, MD, [facility]  10/31/2013 11:39 AM

## 2013-10-31 NOTE — Patient Instructions (Addendum)
Your physician recommends that you schedule a follow-up appointment in: 6 months. No changes were made today in your therapy. 

## 2013-11-07 ENCOUNTER — Telehealth (HOSPITAL_COMMUNITY): Payer: Self-pay | Admitting: *Deleted

## 2013-11-13 DIAGNOSIS — C61 Malignant neoplasm of prostate: Secondary | ICD-10-CM | POA: Diagnosis not present

## 2013-11-16 DIAGNOSIS — Z0000 Encounter for general adult medical examination without abnormal findings: Secondary | ICD-10-CM | POA: Diagnosis not present

## 2013-11-16 DIAGNOSIS — Z79899 Other long term (current) drug therapy: Secondary | ICD-10-CM | POA: Diagnosis not present

## 2013-11-16 DIAGNOSIS — E7800 Pure hypercholesterolemia, unspecified: Secondary | ICD-10-CM | POA: Diagnosis not present

## 2013-11-16 DIAGNOSIS — Z1331 Encounter for screening for depression: Secondary | ICD-10-CM | POA: Diagnosis not present

## 2013-11-16 DIAGNOSIS — I10 Essential (primary) hypertension: Secondary | ICD-10-CM | POA: Diagnosis not present

## 2013-11-16 DIAGNOSIS — Z1339 Encounter for screening examination for other mental health and behavioral disorders: Secondary | ICD-10-CM | POA: Diagnosis not present

## 2013-11-21 ENCOUNTER — Ambulatory Visit (HOSPITAL_COMMUNITY)
Admission: RE | Admit: 2013-11-21 | Discharge: 2013-11-21 | Disposition: A | Discharge status: Home / Self Care | Payer: Medicare Other | Source: Ambulatory Visit | Attending: Cardiovascular Disease | Admitting: Cardiovascular Disease

## 2013-11-21 DIAGNOSIS — I059 Rheumatic mitral valve disease, unspecified: Secondary | ICD-10-CM | POA: Diagnosis not present

## 2013-11-21 DIAGNOSIS — I2510 Atherosclerotic heart disease of native coronary artery without angina pectoris: Secondary | ICD-10-CM

## 2013-11-21 NOTE — Progress Notes (Signed)
  Echocardiogram 2D Echocardiogram has been performed.  Basilia Jumbo 11/21/2013, 9:33 AM

## 2013-11-29 ENCOUNTER — Encounter: Payer: Self-pay | Admitting: *Deleted

## 2013-11-29 DIAGNOSIS — L578 Other skin changes due to chronic exposure to nonionizing radiation: Secondary | ICD-10-CM | POA: Diagnosis not present

## 2013-11-29 DIAGNOSIS — L570 Actinic keratosis: Secondary | ICD-10-CM | POA: Diagnosis not present

## 2013-11-29 DIAGNOSIS — L820 Inflamed seborrheic keratosis: Secondary | ICD-10-CM | POA: Diagnosis not present

## 2013-11-29 DIAGNOSIS — D1800 Hemangioma unspecified site: Secondary | ICD-10-CM | POA: Diagnosis not present

## 2013-11-29 DIAGNOSIS — L821 Other seborrheic keratosis: Secondary | ICD-10-CM | POA: Diagnosis not present

## 2013-11-29 DIAGNOSIS — Z85828 Personal history of other malignant neoplasm of skin: Secondary | ICD-10-CM | POA: Diagnosis not present

## 2013-12-06 DIAGNOSIS — H2510 Age-related nuclear cataract, unspecified eye: Secondary | ICD-10-CM | POA: Diagnosis not present

## 2013-12-27 ENCOUNTER — Other Ambulatory Visit: Payer: Self-pay | Admitting: Cardiovascular Disease

## 2013-12-27 NOTE — Telephone Encounter (Signed)
Rx was sent to pharmacy electronically. 

## 2014-03-21 DIAGNOSIS — Z1331 Encounter for screening for depression: Secondary | ICD-10-CM | POA: Diagnosis not present

## 2014-03-21 DIAGNOSIS — K2100 Gastro-esophageal reflux disease with esophagitis, without bleeding: Secondary | ICD-10-CM | POA: Diagnosis not present

## 2014-03-21 DIAGNOSIS — Z79899 Other long term (current) drug therapy: Secondary | ICD-10-CM | POA: Diagnosis not present

## 2014-03-21 DIAGNOSIS — I2510 Atherosclerotic heart disease of native coronary artery without angina pectoris: Secondary | ICD-10-CM | POA: Diagnosis not present

## 2014-05-14 DIAGNOSIS — C61 Malignant neoplasm of prostate: Secondary | ICD-10-CM | POA: Diagnosis not present

## 2014-05-24 ENCOUNTER — Ambulatory Visit (INDEPENDENT_AMBULATORY_CARE_PROVIDER_SITE_OTHER): Payer: Medicare Other | Admitting: Cardiovascular Disease

## 2014-05-24 ENCOUNTER — Encounter: Payer: Self-pay | Admitting: Cardiovascular Disease

## 2014-05-24 VITALS — BP 116/70 | HR 55 | Ht 71.0 in | Wt 189.1 lb

## 2014-05-24 DIAGNOSIS — I2510 Atherosclerotic heart disease of native coronary artery without angina pectoris: Secondary | ICD-10-CM | POA: Diagnosis not present

## 2014-05-24 DIAGNOSIS — I10 Essential (primary) hypertension: Secondary | ICD-10-CM | POA: Diagnosis not present

## 2014-05-24 DIAGNOSIS — C61 Malignant neoplasm of prostate: Secondary | ICD-10-CM | POA: Diagnosis not present

## 2014-05-24 NOTE — Patient Instructions (Signed)
Your physician wants you to follow-up in: 6 months or sooner if needed. You will receive a reminder letter in the mail two months in advance. If you don't receive a letter, please call our office to schedule the follow-up appointment. No changes were made today in your therapy.

## 2014-05-24 NOTE — Progress Notes (Signed)
Patient ID: Elijah Peters, male   DOB: 1941/08/07, 73 y.o.   MRN: [id number]      HPI: [name] is a 73 y.o. male who presents to the office for a six-month cardiology followup evaluation  Mr. Sroka has established coronary artery disease and underwent initial PCI to his LAD in 1992 while living in [place]. In January 1998, he underwent insertion of 2 stents in his left circumflex coronary artery by me. He has documented mild aortic valve sclerosis, mild concentric LV hypertrophy and normal systolic and diastolic function. Has a history of hyperlipidemia requiring combination therapy and mild hypertension. He denies recent development of chest pain. He denies shortness of breath. He remains active. He walks approximately 2-3 miles per day. His last echo Doppler study was in January 2013. His last nuclear perfusion study was in November 2013 and continued to show normal perfusion.  Do to insurance  Issues Crestor was switched atorvastatin 40 mg and he has continued to take Zetia 10 mg.  Laboratory i n February 2015 showed a total cholesterol of 104 triglycerides 73 HDL cholesterol 35 LDL cholesterol 54 and VLDL cholesterol 13. He had a normal chemistry profile and glucose was minimally increased at 102. CBC was normal.  He did undergo a follow-up echo Doppler study in March 2015.  This showed an ejection fraction at 55-60% with grade 1 diastolic dysfunction.  His aortic valve is mildly thickened and mildly calcified without restricted mobility.  There was no stenosis.  There was trivial AR.  He did have mild MR.  His left atrium was mildly dilated.  There is trivial TR.  Pulmonary pressures were normal  Since I last saw him in March 2015, he has remained active.  He is walking 2-3 miles per day.  He has prostate seeds in place for prostate CA with his PSA being very stable and low.  He denies any exertionally precipitated chest tightness. He denies PND or orthopnea. He is unaware of  palpitations.   Past Medical History  Diagnosis Date  . CAD (coronary artery disease)   . LVH (left ventricular hypertrophy)     mild, concentric  . Mild aortic sclerosis   . Peyronie's disease   . Hypertension   . Myocardial infarction 08/1996    non-Q-wave inferolateral   . Hyperlipidemia   . Prostate cancer   . History of nuclear stress test 06/2012    normal pattern of perfusion; low risk scan    Past Surgical History  Procedure Laterality Date  . Cardiac catheterization  1992    PTCA to LAD, in [place]  . Coronary angioplasty with stent placement  1998    L circumflex - 3.0x23.9x9 bare metal stent  . Transthoracic echocardiogram  09/01/2011    EF=>55%; mild conc LVH; borderline RV enlargement; LA mildly dilated; mild mitral annular calcif; mild-mod MR; RV systolic pressure elevated; AV mildly sclerotic; mild AV regurg; aortic root sclerosis/calcif    Allergies  Allergen Reactions  . Niacin And Related     Higher doses of Niaspan    Current Outpatient Prescriptions  Medication Sig Dispense Refill  . aspirin 81 MG tablet Take 81 mg by mouth daily.      Marland Kitchen atorvastatin (LIPITOR) 40 MG tablet Take 1 tablet (40 mg total) by mouth daily.  90 tablet  3  . COD LIVER OIL PO Take 415 mg by mouth 3 (three) times daily.      . Coenzyme Q10 (COQ10) 200 MG CAPS Take 200 mg  by mouth daily.      . metoprolol succinate (TOPROL-XL) 50 MG 24 hr tablet 1 tablet daily      . omega-3 acid ethyl esters (LOVAZA) 1 G capsule TAKE 4 CAPSULES DAILY  360 capsule  2  . Potassium 99 MG TABS Take 1 tablet by mouth daily.      . ramipril (ALTACE) 10 MG capsule TAKE 1 CAPSULE DAILY  90 capsule  2  . ranitidine (ZANTAC) 150 MG capsule Take 150 mg by mouth daily.      Marland Kitchen ZETIA 10 MG tablet TAKE 1 TABLET DAILY  90 tablet  2   No current facility-administered medications for this visit.    History   Social History  . Marital Status: Married    Spouse Name: N/A    Number of Children: 4  . Years of  Education: N/A   Occupational History  . retired [employer] Topics  . Smoking status: Former Smoker -- 9 years    Quit date: 08/25/1963  . Smokeless tobacco: Never Used  . Alcohol Use: 3.5 oz/week    7 drink(s) per week  . Drug Use: Not on file  . Sexual Activity: Not on file   Other Topics Concern  . Not on file   Social History Narrative  . No narrative on file    Family History  Problem Relation Age of Onset  . Emphysema Father   . Stroke Mother   . Hypertension Brother   . Hyperlipidemia Brother   . Heart disease Brother     CAD   Socially he is married has 4 children 5 grandchildren. He is retired. He walks daily. He does take an occasional glass of wine.   ROS General: Negative; No fevers, chills, or night sweats;  HEENT: Negative; No changes in vision or hearing, sinus congestion, difficulty swallowing Pulmonary: Negative; No cough, wheezing, shortness of breath, hemoptysis Cardiovascular: Negative; No chest pain, presyncope, syncope, palpitations GI: Positive for GERD; No nausea, vomiting, diarrhea, or abdominal pain GU: Negative; No dysuria, hematuria, or difficulty voiding Musculoskeletal: Negative; no myalgias, joint pain, or weakness Hematologic/Oncology: Positive for prostate CA treated with seed implantation; no easy bruising, bleeding Endocrine: Negative; no heat/cold intolerance; no diabetes Neuro: Negative; no changes in balance, headaches Skin: Negative; No rashes or skin lesions Psychiatric: Negative; No behavioral problems, depression Sleep: Negative; No snoring, daytime sleepiness, hypersomnolence, bruxism, restless legs, hypnogognic hallucinations, no cataplexy Other comprehensive 14 point system review is negative.   PE BP 116/70  Pulse 55  Ht 5\' 11"  (1.803 m)  Wt 189 lb 1.6 oz (85.775 kg)  BMI 26.39 kg/m2  General: Alert, oriented, no distress.  Skin: normal turgor, no rashes HEENT: Normocephalic, atraumatic.  Pupils round and reactive; sclera anicteric;no lid lag. No xanthelasma Nose without nasal septal hypertrophy Mouth/Parynx benign; Mallinpatti scale 2 Neck: No JVD, no carotid briuts Chest wall: No tenderness to palpation Lungs: clear to ausculatation and percussion; no wheezing or rales Heart: RRR, s1 s2 normal 2/6 systolic murmur in the aortic area compatible with his aortic sclerosis. No S3 or S4 gallop. No rubs thrills or heaves to Abdomen: soft, nontender; no hepatosplenomehaly, BS+; abdominal aorta nontender and not dilated by palpation. Back: No CVA Pulses 2+ Musculoskeletal: No joint tenderness Extremities: no clubbing cyanosis or edema, Homan's sign negative  Neurologic: grossly nonfocal Psychological: Normal affect and mood  ECG (independently read by me): Sinus bradycardia 55 beats per minute.  Q waves in leads 3  and aVF  Prior March 2015 ECG (independently read by me): Normal sinus rhythm at 61 beats per minute. Normal intervals. Q waves in III and F.  Prior 04/28/13 ECG: Sinus rhythm at 55 beats per minute.  LABS:  BMET    Component Value Date/Time   NA 141 10/16/2013 0825   NA 139 03/26/2009 1120   K 4.7 10/16/2013 0825   CL 103 10/16/2013 0825   CO2 24 10/16/2013 0825   GLUCOSE 102* 10/16/2013 0825   GLUCOSE 91 03/26/2009 1120   BUN 14 10/16/2013 0825   BUN 17 03/26/2009 1120   CREATININE 0.91 10/16/2013 0825   CALCIUM 9.2 10/16/2013 0825   GFRNONAA 83 10/16/2013 0825   GFRAA 96 10/16/2013 0825     Hepatic Function Panel     Component Value Date/Time   PROT 6.3 10/16/2013 0825   PROT 6.8 03/26/2009 1120   ALBUMIN 3.8 03/26/2009 1120   AST 22 10/16/2013 0825   ALT 20 10/16/2013 0825   ALKPHOS 88 10/16/2013 0825   BILITOT 0.7 10/16/2013 0825   BILIDIR 0.15 10/16/2013 0825     CBC    Component Value Date/Time   WBC 5.6 10/16/2013 0825   WBC 6.4 03/26/2009 1120   RBC 4.73 10/16/2013 0825   RBC 4.44 03/26/2009 1120   HGB 15.1 10/16/2013 0825   HCT 44.3 10/16/2013 0825   PLT 187  10/16/2013 0825   MCV 94 10/16/2013 0825   MCH 31.9 10/16/2013 0825   MCHC 34.1 10/16/2013 0825   MCHC 33.2 03/26/2009 1120   RDW 13.0 10/16/2013 0825   RDW 12.8 03/26/2009 1120   LYMPHSABS 2.1 10/16/2013 0825   EOSABS 0.1 10/16/2013 0825   BASOSABS 0.0 10/16/2013 0825     BNP No results found for this basename: probnp    Lipid Panel  No results found for this basename: chol,  trig,  hdl,  cholhdl,  vldl,  ldlcalc     RADIOLOGY: No results found.    ASSESSMENT AND PLAN: Mr. Mathey continues to be stable from a cardiac perspective. He is 23 years following initial intervention to his LAD and >15 years following successful stenting with bare-metal stents to his left circumflex coronary artery. Laboratory in March 2014 showed an LDL particle numbers 737 total cholesterol 75 triglycerides 58 and HDL particle number low at 29.2. He's not having any anginal symptoms. He denies shortness of breath. He continues to exercise regularly.  His blood pressure today is controlled on his current therapy consisting of ramipril, 10 mg and Toprol-XL 50 mg daily.  He is on Zetia 10 mg and Lipitor 40 mg for his hyperlipidemia with good result.  He takes Zantac for GERD symptoms and this is stable.  He tells me his PSA level was low with reference to his prostate CA, for which he had his seat implants.  I reviewed his echo Doppler study.  He does have a 2/6 systolic murmur concordant with his aortic valve sclerosis.  LV function remains stable with mild grade 1 diastolic dysfunction.  He'll continue his current regimen.  I will see him in 6 months for reevaluation.   Time spent: 25 minutes  Troy Sine, MD, [facility]  05/24/2014 6:57 PM

## 2014-06-14 DIAGNOSIS — Z23 Encounter for immunization: Secondary | ICD-10-CM | POA: Diagnosis not present

## 2014-07-16 ENCOUNTER — Other Ambulatory Visit: Payer: Self-pay | Admitting: Cardiovascular Disease

## 2014-07-16 NOTE — Telephone Encounter (Signed)
Rx was sent to pharmacy electronically. 

## 2014-09-20 DIAGNOSIS — C61 Malignant neoplasm of prostate: Secondary | ICD-10-CM | POA: Diagnosis not present

## 2014-09-20 DIAGNOSIS — I2510 Atherosclerotic heart disease of native coronary artery without angina pectoris: Secondary | ICD-10-CM | POA: Diagnosis not present

## 2014-09-20 DIAGNOSIS — K219 Gastro-esophageal reflux disease without esophagitis: Secondary | ICD-10-CM | POA: Diagnosis not present

## 2014-09-20 DIAGNOSIS — E780 Pure hypercholesterolemia: Secondary | ICD-10-CM | POA: Diagnosis not present

## 2014-09-20 DIAGNOSIS — Z23 Encounter for immunization: Secondary | ICD-10-CM | POA: Diagnosis not present

## 2014-10-10 DIAGNOSIS — E780 Pure hypercholesterolemia: Secondary | ICD-10-CM | POA: Diagnosis not present

## 2014-10-10 DIAGNOSIS — I10 Essential (primary) hypertension: Secondary | ICD-10-CM | POA: Diagnosis not present

## 2014-10-10 LAB — LIPID PANEL
CHOLESTEROL: 104 mg/dL (ref 0–200)
HDL: 30 mg/dL — AB (ref 35–70)
LDL CALC: 48 mg/dL
LDl/HDL Ratio: 1.6
TRIGLYCERIDES: 129 mg/dL (ref 40–160)

## 2014-10-10 LAB — HEPATIC FUNCTION PANEL
ALT: 34 U/L (ref 10–40)
AST: 30 U/L (ref 14–40)
Alkaline Phosphatase: 95 U/L (ref 25–125)
Bilirubin, Total: 0.6 mg/dL

## 2014-10-10 LAB — CBC AND DIFFERENTIAL
HCT: 44 % (ref 41–53)
Hemoglobin: 14.7 g/dL (ref 13.5–17.5)
Neutrophils Absolute: 2 /uL
Platelets: 181 10*3/uL (ref 150–399)
WBC: 4.7 10^3/mL

## 2014-10-10 LAB — BASIC METABOLIC PANEL
BUN: 15 mg/dL (ref 4–21)
Creatinine: 0.9 mg/dL (ref 0.6–1.3)
GLUCOSE: 101 mg/dL
POTASSIUM: 5 mmol/L (ref 3.4–5.3)
Sodium: 143 mmol/L (ref 137–147)

## 2014-10-10 LAB — TSH: TSH: 1.7 u[IU]/mL (ref 0.41–5.90)

## 2014-12-05 DIAGNOSIS — L578 Other skin changes due to chronic exposure to nonionizing radiation: Secondary | ICD-10-CM | POA: Diagnosis not present

## 2014-12-05 DIAGNOSIS — L821 Other seborrheic keratosis: Secondary | ICD-10-CM | POA: Diagnosis not present

## 2014-12-05 DIAGNOSIS — L820 Inflamed seborrheic keratosis: Secondary | ICD-10-CM | POA: Diagnosis not present

## 2014-12-05 DIAGNOSIS — L570 Actinic keratosis: Secondary | ICD-10-CM | POA: Diagnosis not present

## 2014-12-05 DIAGNOSIS — Z85828 Personal history of other malignant neoplasm of skin: Secondary | ICD-10-CM | POA: Diagnosis not present

## 2014-12-05 DIAGNOSIS — D1800 Hemangioma unspecified site: Secondary | ICD-10-CM | POA: Diagnosis not present

## 2014-12-05 DIAGNOSIS — Z1283 Encounter for screening for malignant neoplasm of skin: Secondary | ICD-10-CM | POA: Diagnosis not present

## 2014-12-25 DIAGNOSIS — H2512 Age-related nuclear cataract, left eye: Secondary | ICD-10-CM | POA: Diagnosis not present

## 2014-12-27 DIAGNOSIS — K219 Gastro-esophageal reflux disease without esophagitis: Secondary | ICD-10-CM | POA: Diagnosis not present

## 2014-12-27 DIAGNOSIS — I2510 Atherosclerotic heart disease of native coronary artery without angina pectoris: Secondary | ICD-10-CM | POA: Diagnosis not present

## 2014-12-27 DIAGNOSIS — C61 Malignant neoplasm of prostate: Secondary | ICD-10-CM | POA: Diagnosis not present

## 2014-12-27 DIAGNOSIS — Z23 Encounter for immunization: Secondary | ICD-10-CM | POA: Diagnosis not present

## 2014-12-27 DIAGNOSIS — S61219A Laceration without foreign body of unspecified finger without damage to nail, initial encounter: Secondary | ICD-10-CM | POA: Diagnosis not present

## 2015-02-22 ENCOUNTER — Other Ambulatory Visit: Payer: Self-pay | Admitting: Cardiovascular Disease

## 2015-02-22 MED ORDER — EZETIMIBE 10 MG PO TABS: 10.0000 mg | ORAL_TABLET | Freq: Every day | ORAL | Status: DC

## 2015-02-22 NOTE — Telephone Encounter (Signed)
°  1. Which medications need to be refilled? Zeita 10mg  .. Needs a new a prescription   2. Which pharmacy is medication to be sent to?[facility]   3. Do they need a 30 day or 90 day supply? 90  4. Would they like a call back once the medication has been sent to the pharmacy? no

## 2015-02-22 NOTE — Telephone Encounter (Signed)
Rx(s) sent to pharmacy electronically.  

## 2015-03-01 ENCOUNTER — Encounter: Payer: Self-pay | Admitting: Emergency Medicine

## 2015-03-01 DIAGNOSIS — M9979 Connective tissue and disc stenosis of intervertebral foramina of abdomen and other regions: Secondary | ICD-10-CM | POA: Insufficient documentation

## 2015-03-01 DIAGNOSIS — K219 Gastro-esophageal reflux disease without esophagitis: Secondary | ICD-10-CM | POA: Insufficient documentation

## 2015-03-01 DIAGNOSIS — I252 Old myocardial infarction: Secondary | ICD-10-CM | POA: Insufficient documentation

## 2015-03-01 DIAGNOSIS — I2510 Atherosclerotic heart disease of native coronary artery without angina pectoris: Secondary | ICD-10-CM | POA: Insufficient documentation

## 2015-03-01 DIAGNOSIS — C61 Malignant neoplasm of prostate: Secondary | ICD-10-CM | POA: Insufficient documentation

## 2015-03-01 DIAGNOSIS — M720 Palmar fascial fibromatosis [Dupuytren]: Secondary | ICD-10-CM | POA: Insufficient documentation

## 2015-03-01 DIAGNOSIS — N400 Enlarged prostate without lower urinary tract symptoms: Secondary | ICD-10-CM | POA: Insufficient documentation

## 2015-03-01 DIAGNOSIS — I10 Essential (primary) hypertension: Secondary | ICD-10-CM | POA: Insufficient documentation

## 2015-03-01 DIAGNOSIS — E7800 Pure hypercholesterolemia, unspecified: Secondary | ICD-10-CM | POA: Insufficient documentation

## 2015-04-01 ENCOUNTER — Ambulatory Visit (INDEPENDENT_AMBULATORY_CARE_PROVIDER_SITE_OTHER): Payer: Medicare Other | Admitting: Family Medicine

## 2015-04-01 ENCOUNTER — Telehealth: Payer: Self-pay | Admitting: Family Medicine

## 2015-04-01 ENCOUNTER — Encounter: Payer: Self-pay | Admitting: Family Medicine

## 2015-04-01 VITALS — BP 152/80 | HR 62 | Temp 98.0°F | Resp 12 | Ht 70.75 in | Wt 188.0 lb

## 2015-04-01 DIAGNOSIS — I10 Essential (primary) hypertension: Secondary | ICD-10-CM | POA: Diagnosis not present

## 2015-04-01 DIAGNOSIS — M62830 Muscle spasm of back: Secondary | ICD-10-CM | POA: Diagnosis not present

## 2015-04-01 DIAGNOSIS — C61 Malignant neoplasm of prostate: Secondary | ICD-10-CM | POA: Diagnosis not present

## 2015-04-01 DIAGNOSIS — I2510 Atherosclerotic heart disease of native coronary artery without angina pectoris: Secondary | ICD-10-CM | POA: Diagnosis not present

## 2015-04-01 DIAGNOSIS — K219 Gastro-esophageal reflux disease without esophagitis: Secondary | ICD-10-CM

## 2015-04-01 DIAGNOSIS — E785 Hyperlipidemia, unspecified: Secondary | ICD-10-CM | POA: Diagnosis not present

## 2015-04-01 MED ORDER — CYCLOBENZAPRINE HCL 10 MG PO TABS: 10.0000 mg | ORAL_TABLET | Freq: Every day | ORAL | Status: DC | PRN

## 2015-04-01 NOTE — Telephone Encounter (Signed)
Pt called stating he was seen this morning and noticed on his office summary that his vial's were wrong on his height would like to get that changed. CB# [phone]  CC.

## 2015-04-01 NOTE — Progress Notes (Signed)
Patient ID: Elijah Peters, male   DOB: 09/14/1940, 74 y.o.   MRN: [id number]    Subjective:  HPI   Hypertension, follow-up:  BP Readings from Last 3 Encounters:  04/01/15 152/80  12/27/14 144/62  05/24/14 116/70    He was last seen for hypertension 6 months ago.  BP at that visit was 116/70. Management changes since that visit include none. He reports good compliance with treatment. He is not having side effects.  He is exercising.walking He is adherent to low salt diet.   Outside blood pressures are not been checked. He is experiencing no issues.   Weight trend: stable Wt Readings from Last 3 Encounters:  04/01/15 188 lb (85.276 kg)  12/27/14 191 lb (86.637 kg)  05/24/14 189 lb 1.6 oz (85.775 kg)      GERD, Follow up:  The patient was last seen for GERD 6 months ago. Changes made since that visit include none.  He reports good compliance with treatment. He is not having side effects. Marland Kitchen  He IS experiencing no issues, he has not had to take his Ranitidine in about 6 months or so.    Lipid/Cholesterol, Follow-up:   Last seen for this6 months ago.  Management changes since that visit include none. . Last Lipid Panel:    Component Value Date/Time   CHOL 104 10/10/2014   CHOL 104 10/16/2013 0825   TRIG 129 10/10/2014   HDL 30* 10/10/2014   HDL 35* 10/16/2013 0825   LDLCALC 48 10/10/2014   LDLCALC 54 10/16/2013 0825    He reports good compliance with treatment. He is not having side effects.  Current symptoms include none and have been stable. Weight trend: stable Prior visit with dietician: no Current diet: low salt Current exercise: walking  Wt Readings from Last 3 Encounters:  04/01/15 188 lb (85.276 kg)  12/27/14 191 lb (86.637 kg)  05/24/14 189 lb 1.6 oz (85.775 kg)     Back spasms follow up:   Patient states he gets spasms rarely and at that time will take Flexeril as needed. Last fill was in 2013 and he would like to get another  refill today. He has Norco at home to use but that is very rare also.       Prior to Admission medications   Medication Sig Start Date End Date Taking? Authorizing Provider  aspirin 81 MG tablet Take by mouth. 07/01/11  Yes Historical Provider, MD  atorvastatin (LIPITOR) 40 MG tablet Take by mouth.   Yes Historical Provider, MD  B COMPLEX VITAMINS PO Take by mouth.   Yes Historical Provider, MD  calcium carbonate (TUMS) 500 MG chewable tablet Chew by mouth. 07/01/11  Yes Historical Provider, MD  COD LIVER OIL PO Take by mouth. 01/11/13  Yes Historical Provider, MD  Coenzyme Q10 (CO Q10) 200 MG CAPS Take by mouth. 01/11/13  Yes Historical Provider, MD  cyclobenzaprine (FLEXERIL) 10 MG tablet Take by mouth. 04/28/12  Yes Historical Provider, MD  ezetimibe (ZETIA) 10 MG tablet Take 1 tablet (10 mg total) by mouth daily. 02/22/15  Yes Troy Sine, MD  HYDROcodone-acetaminophen (NORCO/VICODIN) 5-325 MG per tablet Take by mouth. 04/28/12  Yes Historical Provider, MD  metoprolol succinate (TOPROL-XL) 50 MG 24 hr tablet 1 tablet daily 12/27/13  Yes Troy Sine, MD  naproxen sodium (ANAPROX) 220 MG tablet Take by mouth. 09/20/14  Yes Historical Provider, MD  omega-3 acid ethyl esters (LOVAZA) 1 G capsule TAKE 4 CAPSULES DAILY 12/27/13  Yes Marcello Moores  Floyce Stakes, MD  Potassium 99 MG TABS Take 1 tablet by mouth daily.   Yes Historical Provider, MD  ramipril (ALTACE) 10 MG capsule TAKE 1 CAPSULE DAILY 12/27/13  Yes Troy Sine, MD  ranitidine (ZANTAC) 150 MG capsule Take by mouth. 11/29/13  Yes Historical Provider, MD    Patient Active Problem List   Diagnosis Date Noted  . CAD in native artery 03/01/2015  . Benign enlargement of prostate 03/01/2015  . Narrowing of intervertebral disc space 03/01/2015  . Contracture of palmar fascia (Dupuytren's) 03/01/2015  . Essential (primary) hypertension 03/01/2015  . Acid reflux 03/01/2015  . H/O acute myocardial infarction 03/01/2015  . CA of prostate 03/01/2015  .  Hypercholesterolemia without hypertriglyceridemia 03/01/2015  . Prostate ca 05/24/2014  . CAD (coronary artery disease) 04/29/2013  . HTN (hypertension) 04/29/2013  . Hyperlipidemia LDL goal < 70 04/29/2013  . Aortic valve sclerosis 04/29/2013    Past Medical History  Diagnosis Date  . CAD (coronary artery disease)   . LVH (left ventricular hypertrophy)     mild, concentric  . Mild aortic sclerosis   . Peyronie's disease   . Hypertension   . Myocardial infarction 08/1996    non-Q-wave inferolateral   . Hyperlipidemia   . Prostate cancer   . History of nuclear stress test 06/2012    normal pattern of perfusion; low risk scan    History   Social History  . Marital Status: Married    Spouse Name: N/A  . Number of Children: 4  . Years of Education: N/A   Occupational History  . retired [employer] Topics  . Smoking status: Former Smoker -- 9 years    Quit date: 08/25/1963  . Smokeless tobacco: Never Used  . Alcohol Use: 3.5 oz/week    7 Standard drinks or equivalent per week  . Drug Use: No  . Sexual Activity: No   Other Topics Concern  . Not on file   Social History Narrative    Allergies  Allergen Reactions  . Niacin And Related     Higher doses of Niaspan    Review of Systems  Constitutional: Positive for malaise/fatigue (in the afternoons and needs a nap). Negative for fever, chills and weight loss.  Respiratory: Negative for cough, hemoptysis, sputum production, shortness of breath and wheezing.   Cardiovascular: Negative for chest pain, palpitations, orthopnea, claudication and leg swelling.  Gastrointestinal: Negative for heartburn, nausea, vomiting and abdominal pain.  Musculoskeletal: Negative for myalgias, back pain, joint pain, falls and neck pain.  Neurological: Negative for dizziness, tingling, tremors (once about 1 month ago), weakness and headaches.    Immunization History  Administered Date(s) Administered  .  Influenza-Unspecified 05/24/2013  . Pneumococcal Conjugate-13 09/20/2014  . Pneumococcal Polysaccharide-23 07/01/2011  . Tdap 12/27/2014  . Zoster 09/27/2012   Objective:  BP 152/80 mmHg  Pulse 62  Temp(Src) 98 F (36.7 C)  Resp 12  Ht 5' 4.75" (1.645 m)  Wt 188 lb (85.276 kg)  BMI 31.51 kg/m2  Physical Exam  Constitutional: He is oriented to person, place, and time and well-developed, well-nourished, and in no distress.  HENT:  Head: Normocephalic.  Eyes: Conjunctivae are normal. Pupils are equal, round, and reactive to light.  Neck: Normal range of motion. Neck supple.  Cardiovascular: Normal rate, regular rhythm, normal heart sounds and intact distal pulses.   No murmur heard. Pulmonary/Chest: Effort normal and breath sounds normal. No respiratory distress. He has no wheezes.  Abdominal: He exhibits no distension. There is no tenderness.  Musculoskeletal: Normal range of motion. He exhibits no edema or tenderness.  dupeytran's contractures.  Neurological: He is alert and oriented to person, place, and time. Gait normal.  Psychiatric: Mood, memory, affect and judgment normal.    Lab Results  Component Value Date   WBC 4.7 10/10/2014   HGB 14.7 10/10/2014   HCT 44 10/10/2014   PLT 181 10/10/2014   GLUCOSE 102* 10/16/2013   CHOL 104 10/10/2014   TRIG 129 10/10/2014   HDL 30* 10/10/2014   LDLCALC 48 10/10/2014   TSH 1.70 10/10/2014   INR 1.0 03/26/2009    CMP     Component Value Date/Time   NA 143 10/10/2014   NA 139 03/26/2009 1120   K 5.0 10/10/2014   CL 103 10/16/2013 0825   CO2 24 10/16/2013 0825   GLUCOSE 102* 10/16/2013 0825   GLUCOSE 91 03/26/2009 1120   BUN 15 10/10/2014   BUN 17 03/26/2009 1120   CREATININE 0.9 10/10/2014   CREATININE 0.91 10/16/2013 0825   CALCIUM 9.2 10/16/2013 0825   PROT 6.3 10/16/2013 0825   PROT 6.8 03/26/2009 1120   ALBUMIN 3.8 03/26/2009 1120   AST 30 10/10/2014   ALT 34 10/10/2014   ALKPHOS 95 10/10/2014   BILITOT  0.7 10/16/2013 0825   GFRNONAA 83 10/16/2013 0825   GFRAA 96 10/16/2013 0825    Assessment and Plan :  1. Essential (primary) hypertension Elevated today. Advised patient to check his B/P at the [facility] and bring in readings on the next visit. Follow for now. No changes with medications needed at this time.  2. Hyperlipidemia with target LDL less than 70 Stable on the last visit.  3. Gastroesophageal reflux disease, esophagitis presence not specified Stable-not using medication regularly.  4. CAD in native artery/no angina. Stable. Follow-up with Dr. Ellouise Newer 5. Prostate ca  Follow up 6 months for Wellness visit or routine follow up  6. Dupuytren's contracture Fifth finger of both hands. Moderately severe. Right greater than left  I have done the exam and reviewed the above chart and it is accurate to the best of my knowledge.   Patient was seen and examined by Dr. Eulas Post and note was scribed by Theressa Millard, RMA.     Miguel Aschoff MD [facility] 04/01/2015 8:43 AM

## 2015-04-02 NOTE — Telephone Encounter (Signed)
Spoke with pt. Corrected his height from his last OV.

## 2015-05-03 ENCOUNTER — Telehealth: Payer: Self-pay | Admitting: Cardiovascular Disease

## 2015-05-03 MED ORDER — RAMIPRIL 10 MG PO CAPS: 10.0000 mg | ORAL_CAPSULE | Freq: Every day | ORAL | Status: DC

## 2015-05-03 NOTE — Telephone Encounter (Signed)
Rx(s) sent to pharmacy electronically.  

## 2015-05-03 NOTE — Telephone Encounter (Signed)
°  1. Which medications need to be refilled? Ramipril 10mg    2. Which pharmacy is medication to be sent to?[facility]   3. Do they need a 30 day or 90 day supply? 90  4. Would they like a call back once the medication has been sent to the pharmacy? No

## 2015-05-20 DIAGNOSIS — Z8546 Personal history of malignant neoplasm of prostate: Secondary | ICD-10-CM | POA: Diagnosis not present

## 2015-05-20 DIAGNOSIS — C61 Malignant neoplasm of prostate: Secondary | ICD-10-CM | POA: Diagnosis not present

## 2015-06-28 ENCOUNTER — Ambulatory Visit (INDEPENDENT_AMBULATORY_CARE_PROVIDER_SITE_OTHER): Payer: Medicare Other | Admitting: Cardiovascular Disease

## 2015-06-28 ENCOUNTER — Encounter: Payer: Self-pay | Admitting: Cardiovascular Disease

## 2015-06-28 VITALS — BP 132/74 | HR 54 | Ht 70.5 in | Wt 183.8 lb

## 2015-06-28 DIAGNOSIS — I2510 Atherosclerotic heart disease of native coronary artery without angina pectoris: Secondary | ICD-10-CM

## 2015-06-28 DIAGNOSIS — E785 Hyperlipidemia, unspecified: Secondary | ICD-10-CM

## 2015-06-28 DIAGNOSIS — I358 Other nonrheumatic aortic valve disorders: Secondary | ICD-10-CM

## 2015-06-28 DIAGNOSIS — I10 Essential (primary) hypertension: Secondary | ICD-10-CM

## 2015-06-28 DIAGNOSIS — I25810 Atherosclerosis of coronary artery bypass graft(s) without angina pectoris: Secondary | ICD-10-CM | POA: Diagnosis not present

## 2015-06-28 DIAGNOSIS — Z79899 Other long term (current) drug therapy: Secondary | ICD-10-CM

## 2015-06-28 DIAGNOSIS — C61 Malignant neoplasm of prostate: Secondary | ICD-10-CM

## 2015-06-28 NOTE — Progress Notes (Signed)
Patient ID: Elijah Peters, male   DOB: [birth year], 74 y.o.   MRN: [id number]      HPI: Elijah Peters is a 74 y.o. male who presents to the office for a one year cardiology followup evaluation  Elijah Peters has established CAD and underwent initial PCI to his LAD in 1992 while living in [place]. In January 1998, he underwent insertion of 2 stents in his left circumflex coronary artery by me. He has documented mild aortic valve sclerosis, mild concentric LV hypertrophy and normal systolic and diastolic function. Has a history of hyperlipidemia requiring combination therapy and mild hypertension.   His last nuclear perfusion study was in November 2013 and continued to show normal perfusion. A follow-up echo Doppler study in March 2015  showed an ejection fraction at 55-60% with grade 1 diastolic dysfunction.  His aortic valve is mildly thickened and mildly calcified without restricted mobility.  There was no stenosis.  There was trivial AR.  He did have mild MR.  His left atrium was mildly dilated.  There is trivial TR.  Pulmonary pressures were normal  Do to insurance  Issues Crestor was switched atorvastatin 40 mg and he has continued to take Zetia 10 mg.  Laboratory i n February 2015 showed a total cholesterol of 104 triglycerides 73 HDL cholesterol 35 LDL cholesterol 54 and VLDL cholesterol 13. He had a normal chemistry profile and glucose was minimally increased at 102. CBC was normal.  Since I saw Elijah Peters one year ago, he has remained active.  He is now walking 1/2 miles per day, which is down from previous 2-3 miles per day.  He denies any exertionally precipitated chest tightness.  He does note some fatigability.  He has prostate seeds in place for prostate CA with his PSA being very stable and low. He denies PND or orthopnea. He is unaware of palpitations.  He tells me he is preparing to establish a living will.  He presents for evaluation  Past Medical History  Diagnosis Date  . CAD (coronary  artery disease)   . LVH (left ventricular hypertrophy)     mild, concentric  . Mild aortic sclerosis ([place])   . Peyronie's disease   . Hypertension   . Myocardial infarction ([place]) 08/1996    non-Q-wave inferolateral   . Hyperlipidemia   . Prostate cancer ([place])   . History of nuclear stress test 06/2012    normal pattern of perfusion; low risk scan    Past Surgical History  Procedure Laterality Date  . Cardiac catheterization  1992    PTCA to LAD, in [place]  . Coronary angioplasty with stent placement  1998    L circumflex - 3.0x23.9x9 bare metal stent  . Transthoracic echocardiogram  09/01/2011    EF=>55%; mild conc LVH; borderline RV enlargement; LA mildly dilated; mild mitral annular calcif; mild-mod MR; RV systolic pressure elevated; AV mildly sclerotic; mild AV regurg; aortic root sclerosis/calcif  . Eye surgery  2011    Cataracts removed.  . Prostate surgery  03/2009    seed implant due to prostate cancer  . Hernia repair  2004  . Tonsillectomy  childhood    Allergies  Allergen Reactions  . Niacin And Related     Higher doses of Niaspan    Current Outpatient Prescriptions  Medication Sig Dispense Refill  . aspirin 81 MG tablet Take by mouth.    Marland Kitchen atorvastatin (LIPITOR) 40 MG tablet Take by mouth.    . B COMPLEX VITAMINS PO Take by  mouth.    . calcium carbonate (TUMS) 500 MG chewable tablet Chew by mouth.    . COD LIVER OIL PO Take by mouth.    . Coenzyme Q10 (CO Q10) 200 MG CAPS Take by mouth.    . cyclobenzaprine (FLEXERIL) 10 MG tablet Take 1 tablet (10 mg total) by mouth daily as needed for muscle spasms. 90 tablet 0  . ezetimibe (ZETIA) 10 MG tablet Take 1 tablet (10 mg total) by mouth daily. 90 tablet 0  . HYDROcodone-acetaminophen (NORCO/VICODIN) 5-325 MG per tablet Take by mouth.    . metoprolol succinate (TOPROL-XL) 50 MG 24 hr tablet 1 tablet daily    . naproxen sodium (ANAPROX) 220 MG tablet Take by mouth.    . omega-3 acid ethyl esters (LOVAZA) 1 G  capsule TAKE 4 CAPSULES DAILY 360 capsule 2  . Potassium 99 MG TABS Take 1 tablet by mouth daily.    . ramipril (ALTACE) 10 MG capsule Take 1 capsule (10 mg total) by mouth daily. 90 capsule 0  . ranitidine (ZANTAC) 150 MG capsule Take by mouth.     No current facility-administered medications for this visit.    Social History   Social History  . Marital Status: Married    Spouse Name: N/A  . Number of Children: 4  . Years of Education: N/A   Occupational History  . retired [employer] Topics  . Smoking status: Former Smoker -- 9 years    Quit date: 08/25/1963  . Smokeless tobacco: Never Used  . Alcohol Use: 3.5 oz/week    7 Standard drinks or equivalent per week  . Drug Use: No  . Sexual Activity: No   Other Topics Concern  . Not on file   Social History Narrative    Family History  Problem Relation Age of Onset  . Emphysema Father   . COPD Father   . Stroke Mother   . Hypertension Brother   . Hyperlipidemia Brother   . Heart disease Brother     CAD   Socially he is married has 4 children 5 grandchildren. He is retired. He walks daily. He does take an occasional glass of wine.   ROS General: Negative; No fevers, chills, or night sweats;  HEENT: Negative; No changes in vision or hearing, sinus congestion, difficulty swallowing Pulmonary: Negative; No cough, wheezing, shortness of breath, hemoptysis Cardiovascular: Negative; No chest pain, presyncope, syncope, palpitations GI: Positive for GERD; No nausea, vomiting, diarrhea, or abdominal pain GU: Negative; No dysuria, hematuria, or difficulty voiding Musculoskeletal: Negative; no myalgias, joint pain, or weakness Hematologic/Oncology: Positive for prostate CA treated with seed implantation; no easy bruising, bleeding Endocrine: Negative; no heat/cold intolerance; no diabetes Neuro: Negative; no changes in balance, headaches Skin: Negative; No rashes or skin lesions Psychiatric:  Negative; No behavioral problems, depression Sleep: Negative; No snoring, daytime sleepiness, hypersomnolence, bruxism, restless legs, hypnogognic hallucinations, no cataplexy Other comprehensive 14 point system review is negative.   PE BP 132/74 mmHg  Pulse 54  Ht 5' 10.5" (1.791 m)  Wt 183 lb 12.8 oz (83.371 kg)  BMI 25.99 kg/m2   Wt Readings from Last 3 Encounters:  06/28/15 183 lb 12.8 oz (83.371 kg)  04/01/15 188 lb (85.276 kg)  12/27/14 191 lb (86.637 kg)   General: Alert, oriented, no distress.  Skin: normal turgor, no rashes HEENT: Normocephalic, atraumatic. Pupils round and reactive; sclera anicteric;no lid lag. No xanthelasma Nose without nasal septal hypertrophy Mouth/Parynx benign; Mallinpatti scale 2  Neck: No JVD, no carotid bruits Chest wall: No tenderness to palpation Lungs: clear to ausculatation and percussion; no wheezing or rales Heart: RRR, s1 s2 normal 2/6 systolic murmur in the aortic area compatible with his aortic sclerosis. No S3 or S4 gallop. No rubs thrills or heaves to Abdomen: soft, nontender; no hepatosplenomehaly, BS+; abdominal aorta nontender and not dilated by palpation. Back: No CVA Pulses 2+ Musculoskeletal: No joint tenderness Extremities: no clubbing cyanosis or edema, Homan's sign negative  Neurologic: grossly nonfocal Psychological: Normal affect and mood  ECG (independently read by me): Sinus bradycardia 54, old inferior Q waves in 3 and aVF.  Normal intervals.  October 2015 ECG (independently read by me): Sinus bradycardia 55 beats per minute.  Q waves in leads 3 and aVF  Prior March 2015 ECG (independently read by me): Normal sinus rhythm at 61 beats per minute. Normal intervals. Q waves in III and F.  Prior 04/28/13 ECG: Sinus rhythm at 55 beats per minute.  LABS: BMP Latest Ref Rng 10/10/2014 10/16/2013 03/26/2009  Glucose 65 - 99 mg/dL - 102(H) 91  BUN 4 - 21 mg/dL 15 14 17   Creatinine 0.6 - 1.3 mg/dL 0.9 0.91 1.02  BUN/Creat  Ratio 10 - 22 - 15 -  Sodium 137 - 147 mmol/L 143 141 139  Potassium 3.4 - 5.3 mmol/L 5.0 4.7 4.7  Chloride 97 - 108 mmol/L - 103 107  CO2 18 - 29 mmol/L - 24 29  Calcium 8.6 - 10.2 mg/dL - 9.2 9.6   Hepatic Function Latest Ref Rng 10/10/2014 10/16/2013 03/26/2009  Total Protein 6.0 - 8.5 g/dL - 6.3 6.8  Albumin 3.5 - 4.8 g/dL - 4.0 3.8  AST 14 - 40 U/L 30 22 21   ALT 10 - 40 U/L 34 20 19  Alk Phosphatase 25 - 125 U/L 95 88 54  Total Bilirubin 0.0 - 1.2 mg/dL - 0.7 1.0  Bilirubin, Direct 0.00 - 0.40 mg/dL - 0.15 -   CBC Latest Ref Rng 10/10/2014 10/16/2013 03/26/2009  WBC - 4.7 5.6 6.4  Hemoglobin 13.5 - 17.5 g/dL 14.7 15.1 14.2  Hematocrit 41 - 53 % 44 44.3 42.7  Platelets 150 - 399 K/L 181 187 227   Lab Results  Component Value Date   MCV 94 10/16/2013   MCV 96.1 03/26/2009   Lab Results  Component Value Date   TSH 1.70 10/10/2014  No results found for: HGBA1C   Lipid Panel     Component Value Date/Time   CHOL 104 10/10/2014   CHOL 104 10/16/2013 0825   TRIG 129 10/10/2014   HDL 30* 10/10/2014   HDL 35* 10/16/2013 0825   LDLCALC 48 10/10/2014   LDLCALC 54 10/16/2013 0825    RADIOLOGY: No results found.    ASSESSMENT AND PLAN: Elijah Peters is a 20 -year-old white male continues to be stable from a cardiac perspective. He is 24 years following initial intervention to his LAD and 18 years following successful stenting with bare-metal stents to his left circumflex coronary artery.  We have been treating Elijah Peters very aggressively with reference to his lipid studies.  Laboratory in March 2014 showed an LDL particle numbers 737 total cholesterol 75 triglycerides 58 and HDL particle number low at 29.2. He's not having any anginal symptoms. He denies shortness of breath. He continues to exercise regularly.  His blood pressure today is controlled on his current therapy consisting of ramipril, 10 mg and Toprol-XL 50 mg daily.  He is on Zetia 10 mg  and Lipitor 40 mg for his hyperlipidemia  and his last LDL level in February 2016 was excellent at 48.  I discussed with Elijah Peters the aggressive therapy undoubtedly has reduced the likelihood for CAD progression and hopefully has induced potential plaque regression.  With reference to his prostate CA, he tells me his last PSA was 0.05.  This has been stable.  He does have aortic valve sclerosis without stenosis, documented on echocardiography.  His ECG remained stable with sinus bradycardia.  I discussed with Elijah Peters and of life issues and the importance of acute versus chronic conditions which may transiently require ventilatory and advanced life support measures.  I will see Elijah Peters in 6 months for cardiology reevaluation.  Time spent: 25 minutes  Troy Sine, MD, [facility]  06/28/2015 10:50 AM

## 2015-06-28 NOTE — Patient Instructions (Signed)
Your physician recommends that you return for lab work fasting at your convenience.  Your physician wants you to follow-up in: 6 months or sooner if needed. You will receive a reminder letter in the mail two months in advance. If you don't receive a letter, please call our office to schedule the follow-up appointment.  If you need a refill on your cardiac medications before your next appointment, please call your pharmacy.

## 2015-07-12 ENCOUNTER — Telehealth: Payer: Self-pay | Admitting: Cardiovascular Disease

## 2015-07-12 DIAGNOSIS — Z79899 Other long term (current) drug therapy: Secondary | ICD-10-CM | POA: Diagnosis not present

## 2015-07-12 DIAGNOSIS — I10 Essential (primary) hypertension: Secondary | ICD-10-CM | POA: Diagnosis not present

## 2015-07-12 DIAGNOSIS — I25810 Atherosclerosis of coronary artery bypass graft(s) without angina pectoris: Secondary | ICD-10-CM | POA: Diagnosis not present

## 2015-07-12 DIAGNOSIS — E785 Hyperlipidemia, unspecified: Secondary | ICD-10-CM | POA: Diagnosis not present

## 2015-07-12 MED ORDER — EZETIMIBE 10 MG PO TABS: 10.0000 mg | ORAL_TABLET | Freq: Every day | ORAL | Status: DC

## 2015-07-12 NOTE — Telephone Encounter (Signed)
Pt's Rx was sent to pt's pharmacy as requested and also to express script as well. Confirmation received.

## 2015-07-12 NOTE — Telephone Encounter (Signed)
°*  STAT* If patient is at the pharmacy, call can be transferred to refill team.   1. Which medications need to be refilled? (please list name of each medication and dose if known)Zetia2.which pharmacy/location (including street and city if local pharmacy) is medication to be sent to?[facility]  3. Do they need a 30 day or 90 day supply? 30 until his mail order comes

## 2015-07-16 ENCOUNTER — Encounter: Payer: Self-pay | Admitting: Cardiovascular Disease

## 2015-10-02 ENCOUNTER — Encounter: Payer: Medicare Other | Admitting: Family Medicine

## 2015-10-09 ENCOUNTER — Ambulatory Visit (INDEPENDENT_AMBULATORY_CARE_PROVIDER_SITE_OTHER): Payer: Medicare Other | Admitting: Family Medicine

## 2015-10-09 ENCOUNTER — Encounter: Payer: Self-pay | Admitting: Family Medicine

## 2015-10-09 VITALS — BP 148/72 | HR 54 | Temp 97.6°F | Resp 12 | Ht 70.75 in | Wt 189.0 lb

## 2015-10-09 DIAGNOSIS — Z0000 Encounter for general adult medical examination without abnormal findings: Secondary | ICD-10-CM

## 2015-10-09 NOTE — Progress Notes (Signed)
Patient ID: Elijah Peters, male   DOB: 1941-04-13, 75 y.o.   MRN: [id number]  Visit Date: 10/09/2015  Today's Provider: [name], MD   Chief Complaint  Patient presents with  . Medicare Wellness   Subjective:   Elijah Peters is a 75 y.o. male who presents today for his Subsequent Annual Wellness Visit. He feels well. He reports exercising walks 1 mile daily. He reports he is sleeping well.  Immunization History  Administered Date(s) Administered  . Influenza-Unspecified 05/24/2013, 05/30/2015  . Pneumococcal Conjugate-13 09/20/2014  . Pneumococcal Polysaccharide-23 07/01/2011  . Tdap 12/27/2014  . Zoster 09/27/2012   LAST Colonoscopy and Endoscopy  01/04/12 internal hemorrhoids, diverticulosis, repeat 5 to 10 years.    Review of Systems  Constitutional: Negative.   HENT: Negative.   Eyes: Negative.   Respiratory: Negative.   Cardiovascular: Negative.   Gastrointestinal: Negative.   Endocrine: Negative.   Genitourinary: Negative.   Musculoskeletal: Positive for neck pain.  Skin: Negative.   Allergic/Immunologic: Negative.   Neurological: Negative.   Hematological: Negative.   Psychiatric/Behavioral: Negative.     Patient Active Problem List   Diagnosis Date Noted  . CAD in native artery 03/01/2015  . Benign enlargement of prostate 03/01/2015  . Narrowing of intervertebral disc space 03/01/2015  . Contracture of palmar fascia (Dupuytren's) 03/01/2015  . Essential (primary) hypertension 03/01/2015  . Acid reflux 03/01/2015  . H/O acute myocardial infarction 03/01/2015  . CA of prostate ([place]) 03/01/2015  . Hypercholesterolemia without hypertriglyceridemia 03/01/2015  . Prostate CA ([place]) 05/24/2014  . CAD (coronary artery disease) 04/29/2013  . HTN (hypertension) 04/29/2013  . Hyperlipidemia with target LDL less than 70 04/29/2013  . Aortic valve sclerosis 04/29/2013    Social History   Social History  . Marital Status: Married    Spouse Name: N/A   . Number of Children: 4  . Years of Education: N/A   Occupational History  . retired [place] Topics  . Smoking status: Former Smoker -- 9 years    Quit date: 08/25/1963  . Smokeless tobacco: Never Used  . Alcohol Use: 3.5 oz/week    7 Standard drinks or equivalent per week     Comment: 2 to 3 days a week-1 to 2 drinks at a time  . Drug Use: No  . Sexual Activity: No   Other Topics Concern  . Not on file   Social History Narrative    Past Surgical History  Procedure Laterality Date  . Cardiac catheterization  1992    PTCA to LAD, in [place]  . Coronary angioplasty with stent placement  1998    L circumflex - 3.0x23.9x9 bare metal stent  . Transthoracic echocardiogram  09/01/2011    EF=>55%; mild conc LVH; borderline RV enlargement; LA mildly dilated; mild mitral annular calcif; mild-mod MR; RV systolic pressure elevated; AV mildly sclerotic; mild AV regurg; aortic root sclerosis/calcif  . Eye surgery  2011    Cataracts removed.  . Prostate surgery  03/2009    seed implant due to prostate cancer  . Hernia repair  2004  . Tonsillectomy  childhood    His family history includes COPD in his father; Emphysema in his father; Heart disease in his brother; Hyperlipidemia in his brother; Hypertension in his brother; Stroke in his mother.    Outpatient Prescriptions Prior to Visit  Medication Sig Dispense Refill  . aspirin 81 MG tablet Take by mouth.    Marland Kitchen atorvastatin (LIPITOR) 40 MG tablet  Take by mouth.    . B COMPLEX VITAMINS PO Take by mouth.    . COD LIVER OIL PO Take by mouth.    . Coenzyme Q10 (CO Q10) 200 MG CAPS Take by mouth.    . cyclobenzaprine (FLEXERIL) 10 MG tablet Take 1 tablet (10 mg total) by mouth daily as needed for muscle spasms. 90 tablet 0  . ezetimibe (ZETIA) 10 MG tablet Take 1 tablet (10 mg total) by mouth daily. 90 tablet 3  . HYDROcodone-acetaminophen (NORCO/VICODIN) 5-325 MG per tablet Take by mouth.    . metoprolol  succinate (TOPROL-XL) 50 MG 24 hr tablet 1 tablet daily    . naproxen sodium (ANAPROX) 220 MG tablet Take by mouth.    . omega-3 acid ethyl esters (LOVAZA) 1 G capsule TAKE 4 CAPSULES DAILY 360 capsule 2  . Potassium 99 MG TABS Take 1 tablet by mouth daily.    . ramipril (ALTACE) 10 MG capsule Take 1 capsule (10 mg total) by mouth daily. 90 capsule 0  . ranitidine (ZANTAC) 150 MG capsule Take by mouth.    . calcium carbonate (TUMS) 500 MG chewable tablet Chew by mouth.     No facility-administered medications prior to visit.    Allergies  Allergen Reactions  . Niacin And Related     Higher doses of Niaspan    Patient Care Team: Jerrol Banana., MD as PCP - General (Family Medicine)  Objective:   Vitals:  Filed Vitals:   10/09/15 0941  BP: 148/72  Pulse: 54  Temp: 97.6 F (36.4 C)  Resp: 12  Height: 5' 10.75" (1.797 m)  Weight: 189 lb (85.73 kg)    Physical Exam  Constitutional: He is oriented to person, place, and time. He appears well-developed.  HENT:  Head: Normocephalic and atraumatic.  Right Ear: External ear normal.  Left Ear: External ear normal.  Nose: Nose normal.  Mouth/Throat: Oropharynx is clear and moist.  Cardiovascular: Normal rate, regular rhythm, normal heart sounds and intact distal pulses.   2/6 systolic murmur.  Pulmonary/Chest: Effort normal and breath sounds normal.  Abdominal: Soft.  Neurological: He is alert and oriented to person, place, and time.  Skin: Skin is warm and dry.  Fair skin  Psychiatric: He has a normal mood and affect. His behavior is normal. Thought content normal.    Activities of Daily Living In your present state of health, do you have any difficulty performing the following activities: 04/01/2015  Hearing? N  Vision? N  Difficulty concentrating or making decisions? N  Walking or climbing stairs? N  Dressing or bathing? N  Doing errands, shopping? N    Fall Risk Assessment Fall Risk  10/09/2015 04/01/2015   Falls in the past year? No No     Depression Screen PHQ 2/9 Scores 10/09/2015 04/01/2015  PHQ - 2 Score 0 0    Cognitive Testing - 6-CIT    Year: 0 4 points  Month: 0 3 points  Memorize "Pia Mau, [address], [place]"  Time (within 1 hour:) 0 3 points  Count backwards from 20: 0 2 4 points  Name months of year: 0 2 4 points  Repeat Address: 0 2 4 6 8 10  points   Total Score: 0/28  Interpretation : Normal (0-7) Abnormal (8-28)    Assessment & Plan:     Annual Wellness Visit  Reviewed patient's Family Medical History Reviewed and updated list of patient's medical providers Assessment of cognitive impairment was done Assessed  patient's functional ability Established a written schedule for health screening [place] Completed and Reviewed  CAD All risk factors treated. Followed by Dr. Claiborne Billings Prostate cancer Followed by urology  I have done the exam and reviewed the above chart and it is accurate to the best of my knowledge.  Miguel Aschoff MD [facility] 10/09/2015 9:42 AM  ------------------------------------------------------------------------------------------------------------

## 2015-10-28 ENCOUNTER — Other Ambulatory Visit: Payer: Self-pay | Admitting: Cardiovascular Disease

## 2015-10-29 ENCOUNTER — Other Ambulatory Visit: Payer: Self-pay | Admitting: *Deleted

## 2015-10-29 MED ORDER — OMEGA-3-ACID ETHYL ESTERS 1 G PO CAPS: 4.0000 | ORAL_CAPSULE | Freq: Every day | ORAL | Status: DC

## 2015-11-05 ENCOUNTER — Telehealth: Payer: Self-pay | Admitting: Cardiovascular Disease

## 2015-11-05 MED ORDER — ICOSAPENT ETHYL 1 G PO CAPS: 2.0000 | ORAL_CAPSULE | Freq: Two times a day (BID) | ORAL | Status: DC

## 2015-11-05 NOTE — Telephone Encounter (Signed)
Change to vascepa

## 2015-11-05 NOTE — Telephone Encounter (Signed)
Conversed with Erasmo Downer - change to Vascepa 2 capsules PO BID.  Rx(s) sent to pharmacy electronically.

## 2015-11-05 NOTE — Telephone Encounter (Signed)
Heath Lark is calling because the Omega 3 medication is not covered

## 2015-11-05 NOTE — Telephone Encounter (Signed)
Spoke with [place] technician - Lovaza is not covered   Vascepa is on formulary   Will defer to MD to see if he would like to make med change

## 2015-12-11 ENCOUNTER — Telehealth: Payer: Self-pay

## 2015-12-11 DIAGNOSIS — Z1283 Encounter for screening for malignant neoplasm of skin: Secondary | ICD-10-CM | POA: Diagnosis not present

## 2015-12-11 DIAGNOSIS — Z85828 Personal history of other malignant neoplasm of skin: Secondary | ICD-10-CM | POA: Diagnosis not present

## 2015-12-11 DIAGNOSIS — D1800 Hemangioma unspecified site: Secondary | ICD-10-CM | POA: Diagnosis not present

## 2015-12-11 DIAGNOSIS — L812 Freckles: Secondary | ICD-10-CM | POA: Diagnosis not present

## 2015-12-11 DIAGNOSIS — L578 Other skin changes due to chronic exposure to nonionizing radiation: Secondary | ICD-10-CM | POA: Diagnosis not present

## 2015-12-11 DIAGNOSIS — I8310 Varicose veins of unspecified lower extremity with inflammation: Secondary | ICD-10-CM | POA: Diagnosis not present

## 2015-12-11 DIAGNOSIS — L570 Actinic keratosis: Secondary | ICD-10-CM | POA: Diagnosis not present

## 2015-12-11 DIAGNOSIS — L820 Inflamed seborrheic keratosis: Secondary | ICD-10-CM | POA: Diagnosis not present

## 2015-12-11 DIAGNOSIS — L821 Other seborrheic keratosis: Secondary | ICD-10-CM | POA: Diagnosis not present

## 2015-12-11 NOTE — Telephone Encounter (Signed)
FYI  Patient came up to the office this afternoon around 3:45 pm with his wife. Patient states that he saw dermatologist today at 8:30 am and had some skin spots frozen off including on his forehead and one spot close to the temple/left eye. Patient states they did not inject him with numbing medication or anything. At 2 pm patient noticed some numbness sensation under left eye going through under his nose and across. He looked alert and not in acute distress. He denied having any chest pain, slurred speech, shortness of breath or any numbness or tingling sensation anywhere else. Spoke with Dr. Rosanna Randy and he states that certain spots if they were frozen on the face could have damaged some facial nerve but that we can see patient and examine him to see if there is something we can find/help. Related this information to the patient and wife and they felt ok with just getting in touch with dermatologist to see if this is normal after the procedure. Patient felt fine and stated that the numbness sensation was almost gone now. i advised patient that we are more than happy to see him and he states he appreciated that and felt ok to just wait and see.-aa

## 2015-12-17 ENCOUNTER — Telehealth: Payer: Self-pay

## 2015-12-17 NOTE — Telephone Encounter (Signed)
Would use warm compresses to area. I am happy to see him to evaluate this.

## 2015-12-17 NOTE — Telephone Encounter (Signed)
Pt advised, advised patient if in 2 weeks he is not better we can see him and make sure no further treatment is needed, advised patient if symptoms get worse before that to go ahead and come in to be evaluated. Patient understood and is fine with that plan-aa

## 2015-12-17 NOTE — Telephone Encounter (Signed)
Called patient to check on him today, he called Friday stating that his left side of the face still had that numbing sensation and I spoke to Dr. Venia Minks and she agreed it sounded like the nerve got irritated somehow after the cry open procedure and that it could take 2 weeks or so for this to improve. Numbness starts under left eye and radiates under his nose and lips but all on the left side only, which is the side he had the procedure on. He denies any other symptoms, no stroke symptoms and feels fine. Today I spoke to him and symptoms are the same nothing got worse or better yet. When he puts -lets say a cup to his mouth to drink he cant feel it on the left side but feels it on the right side.  I wanted to see if patient needs to be on any medications to help with nerve damage or just to wait and see if it improves. Patient is fine I just told him i would double check make sure we don't need to do anything else about it-aa

## 2016-02-13 ENCOUNTER — Other Ambulatory Visit: Payer: Self-pay | Admitting: Ophthalmology

## 2016-02-13 ENCOUNTER — Ambulatory Visit (INDEPENDENT_AMBULATORY_CARE_PROVIDER_SITE_OTHER): Payer: Medicare Other | Admitting: Family Medicine

## 2016-02-13 VITALS — BP 158/66 | HR 60 | Temp 98.3°F | Resp 12 | Wt 188.0 lb

## 2016-02-13 DIAGNOSIS — H532 Diplopia: Secondary | ICD-10-CM | POA: Diagnosis not present

## 2016-02-13 DIAGNOSIS — H269 Unspecified cataract: Secondary | ICD-10-CM | POA: Diagnosis not present

## 2016-02-13 NOTE — Progress Notes (Signed)
Patient ID: Elijah Peters, male   DOB: May 25, 1941, 75 y.o.   MRN: [id number]    Subjective:  HPI  Patient states that a weeks ago Wednesday he noticed swelling in the left cheek and tenderness, the swelling has gotten bigger and can feel uncomfortable sensation around his mouth and under left eye, tenderness sensation is better. He also states that he has cataract in left eye and has check up in 2 weeks for this and states since around the time he noticed the facial swelling is when his vision has been doubled also and wonders if swelling is affecting his eye somehow. He usually sees double when he has his glasses off but now he sees double with glasses on too. Prior to Admission medications   Medication Sig Start Date End Date Taking? Authorizing Provider  aspirin 81 MG tablet Take by mouth. 07/01/11  Yes Historical Provider, MD  atorvastatin (LIPITOR) 40 MG tablet TAKE 1 TABLET DAILY 10/28/15  Yes Troy Sine, MD  B COMPLEX VITAMINS PO Take by mouth.   Yes Historical Provider, MD  COD LIVER OIL PO Take by mouth. 01/11/13  Yes Historical Provider, MD  Coenzyme Q10 (CO Q10) 200 MG CAPS Take by mouth. 01/11/13  Yes Historical Provider, MD  cyclobenzaprine (FLEXERIL) 10 MG tablet Take 1 tablet (10 mg total) by mouth daily as needed for muscle spasms. 04/01/15  Yes Richard Maceo Pro., MD  ezetimibe (ZETIA) 10 MG tablet Take 1 tablet (10 mg total) by mouth daily. 07/12/15  Yes Troy Sine, MD  HYDROcodone-acetaminophen (NORCO/VICODIN) 5-325 MG per tablet Take by mouth. 04/28/12  Yes Historical Provider, MD  Icosapent Ethyl 1 g CAPS Take 2 capsules by mouth 2 (two) times daily. 11/05/15  Yes Troy Sine, MD  metoprolol succinate (TOPROL-XL) 50 MG 24 hr tablet 1 tablet daily 12/27/13  Yes Troy Sine, MD  naproxen sodium (ANAPROX) 220 MG tablet Take by mouth. 09/20/14  Yes Historical Provider, MD  Potassium 99 MG TABS Take 1 tablet by mouth daily.   Yes Historical Provider, MD  ramipril (ALTACE) 10  MG capsule TAKE 1 CAPSULE DAILY 10/28/15  Yes Troy Sine, MD  ranitidine (ZANTAC) 150 MG capsule Take by mouth. 11/29/13  Yes Historical Provider, MD    Patient Active Problem List   Diagnosis Date Noted  . CAD in native artery 03/01/2015  . Benign enlargement of prostate 03/01/2015  . Narrowing of intervertebral disc space 03/01/2015  . Contracture of palmar fascia (Dupuytren's) 03/01/2015  . Essential (primary) hypertension 03/01/2015  . Acid reflux 03/01/2015  . H/O acute myocardial infarction 03/01/2015  . CA of prostate ([place]) 03/01/2015  . Hypercholesterolemia without hypertriglyceridemia 03/01/2015  . Prostate CA ([place]) 05/24/2014  . CAD (coronary artery disease) 04/29/2013  . HTN (hypertension) 04/29/2013  . Hyperlipidemia with target LDL less than 70 04/29/2013  . Aortic valve sclerosis 04/29/2013    Past Medical History  Diagnosis Date  . CAD (coronary artery disease)   . LVH (left ventricular hypertrophy)     mild, concentric  . Mild aortic sclerosis ([place])   . Peyronie's disease   . Hypertension   . Myocardial infarction ([place]) 08/1996    non-Q-wave inferolateral   . Hyperlipidemia   . Prostate cancer ([place])   . History of nuclear stress test 06/2012    normal pattern of perfusion; low risk scan    Social History   Social History  . Marital Status: Married    Spouse Name: N/A  .  Number of Children: 4  . Years of Education: N/A   Occupational History  . retired [employer] Topics  . Smoking status: Former Smoker -- 9 years    Quit date: 08/25/1963  . Smokeless tobacco: Never Used  . Alcohol Use: 3.5 oz/week    7 Standard drinks or equivalent per week     Comment: 2 to 3 days a week-1 to 2 drinks at a time  . Drug Use: No  . Sexual Activity: No   Other Topics Concern  . Not on file   Social History Narrative    Allergies  Allergen Reactions  . Niacin And Related     Higher doses of Niaspan    Review of Systems    Constitutional: Negative.   Eyes: Positive for double vision and discharge (watery eyes).  Respiratory: Negative.   Cardiovascular: Negative.   Musculoskeletal: Positive for myalgias and back pain.  Skin:       Facial swelling left side  Neurological: Negative.     Immunization History  Administered Date(s) Administered  . Influenza-Unspecified 05/24/2013, 05/30/2015  . Pneumococcal Conjugate-13 09/20/2014  . Pneumococcal Polysaccharide-23 07/01/2011  . Tdap 12/27/2014  . Zoster 09/27/2012   Objective:  BP 158/66 mmHg  Pulse 60  Temp(Src) 98.3 F (36.8 C)  Resp 12  Wt 188 lb (85.276 kg)  Physical Exam  Constitutional: He is oriented to person, place, and time and well-developed, well-nourished, and in no distress. Vital signs are normal.  HENT:  Head: Normocephalic and atraumatic.  Right Ear: Hearing, tympanic membrane and external ear normal.  Left Ear: Hearing, tympanic membrane, external ear and ear canal normal.  Nose: Nose normal.  Mouth/Throat: Oropharynx is clear and moist and mucous membranes are normal.  Fullness in the face just below the lefy eye with some tenderness.  Eyes: Conjunctivae are normal. Pupils are equal, round, and reactive to light.  Lack of full lateral movement with left eye  Neck: Normal range of motion. Neck supple.  Cardiovascular: Normal rate, regular rhythm, normal heart sounds and intact distal pulses.   No murmur heard. Pulmonary/Chest: Effort normal and breath sounds normal.  Musculoskeletal: Normal range of motion.  Neurological: He is alert and oriented to person, place, and time. No cranial nerve deficit. Gait normal. Coordination normal.  Psychiatric: Mood, memory, affect and judgment normal.    Lab Results  Component Value Date   WBC 4.7 10/10/2014   HGB 14.7 10/10/2014   HCT 44 10/10/2014   PLT 181 10/10/2014   GLUCOSE 102* 10/16/2013   CHOL 104 10/10/2014   TRIG 129 10/10/2014   HDL 30* 10/10/2014   LDLCALC 48  10/10/2014   TSH 1.70 10/10/2014   INR 1.0 03/26/2009    CMP     Component Value Date/Time   NA 143 10/10/2014   NA 139 03/26/2009 1120   K 5.0 10/10/2014   CL 103 10/16/2013 0825   CO2 24 10/16/2013 0825   GLUCOSE 102* 10/16/2013 0825   GLUCOSE 91 03/26/2009 1120   BUN 15 10/10/2014   BUN 17 03/26/2009 1120   CREATININE 0.9 10/10/2014   CREATININE 0.91 10/16/2013 0825   CALCIUM 9.2 10/16/2013 0825   PROT 6.3 10/16/2013 0825   PROT 6.8 03/26/2009 1120   ALBUMIN 4.0 10/16/2013 0825   ALBUMIN 3.8 03/26/2009 1120   AST 30 10/10/2014   ALT 34 10/10/2014   ALKPHOS 95 10/10/2014   BILITOT 0.7 10/16/2013 0825   GFRNONAA 83 10/16/2013  0825   GFRAA 96 10/16/2013 0825    Assessment and Plan :  1. Cataract, left eye 2. Diplopia, visual disturbance, weakness left lateral rectus muscle Ophthalmology to see patient this afternoon. 3. Possible cellulitis left face Will await ophthalmology consult but may need ENT consultation or imaging of the face/skull. He has no other symptoms other than the visual disturbance and the facial tenderness and mild swelling. The swelling is found on palpation but is not visually notable. I have done the exam and reviewed the above chart and it is accurate to the best of my knowledge.  Miguel Aschoff MD [facility] 02/13/2016 9:37 AM

## 2016-02-14 ENCOUNTER — Ambulatory Visit
Admission: RE | Admit: 2016-02-14 | Discharge: 2016-02-14 | Disposition: A | Discharge status: Home / Self Care | Payer: Medicare Other | Source: Ambulatory Visit | Attending: Ophthalmology | Admitting: Ophthalmology

## 2016-02-14 ENCOUNTER — Telehealth: Payer: Self-pay

## 2016-02-14 ENCOUNTER — Encounter (INDEPENDENT_AMBULATORY_CARE_PROVIDER_SITE_OTHER): Payer: Self-pay

## 2016-02-14 DIAGNOSIS — H532 Diplopia: Secondary | ICD-10-CM

## 2016-02-14 DIAGNOSIS — I6782 Cerebral ischemia: Secondary | ICD-10-CM | POA: Insufficient documentation

## 2016-02-14 DIAGNOSIS — G319 Degenerative disease of nervous system, unspecified: Secondary | ICD-10-CM | POA: Insufficient documentation

## 2016-02-14 LAB — POCT I-STAT CREATININE: CREATININE: 0.9 mg/dL (ref 0.61–1.24)

## 2016-02-14 MED ORDER — GADOBENATE DIMEGLUMINE 529 MG/ML IV SOLN
20.0000 mL | Freq: Once | INTRAVENOUS | Status: AC | PRN
  Administered 2016-02-14: 17 mL via INTRAVENOUS

## 2016-02-14 NOTE — Telephone Encounter (Signed)
Elijah Peters, from Dr. Lewayne Bunting, office called and states they ordered stat MRI and would like for you to look at the findings where he had sinus obstruction and what patient need to do at this time. From eye stand point they are following up with patient next week and may send him to neuroopthalmologist for double vision. Please review-aa

## 2016-02-18 ENCOUNTER — Telehealth: Payer: Self-pay | Admitting: Family Medicine

## 2016-02-18 DIAGNOSIS — R930 Abnormal findings on diagnostic imaging of skull and head, not elsewhere classified: Secondary | ICD-10-CM

## 2016-02-18 NOTE — Telephone Encounter (Signed)
Pt states he talked with Ana before and would like for her to call him back, pt is a little confused on what to do as far making a appointment after he got referred to 2 other dr's. Thanks CC

## 2016-02-18 NOTE — Telephone Encounter (Signed)
Went over patient's appointments he has had and MRI scan what it showed for sinuses and that Dr. Rosanna Randy wants patient to be referred to ENT. Patient states face feels the same and tenderness and swelling is the same-aa

## 2016-02-21 ENCOUNTER — Other Ambulatory Visit: Payer: Self-pay | Admitting: Otolaryngology

## 2016-02-21 ENCOUNTER — Ambulatory Visit
Admission: RE | Admit: 2016-02-21 | Discharge: 2016-02-21 | Disposition: A | Discharge status: Home / Self Care | Payer: Medicare Other | Source: Ambulatory Visit | Attending: Otolaryngology | Admitting: Otolaryngology

## 2016-02-21 DIAGNOSIS — R51 Headache: Secondary | ICD-10-CM | POA: Diagnosis not present

## 2016-02-21 DIAGNOSIS — J320 Chronic maxillary sinusitis: Secondary | ICD-10-CM | POA: Insufficient documentation

## 2016-02-21 DIAGNOSIS — R220 Localized swelling, mass and lump, head: Secondary | ICD-10-CM | POA: Diagnosis not present

## 2016-02-26 ENCOUNTER — Telehealth: Payer: Self-pay | Admitting: Family Medicine

## 2016-02-26 NOTE — Telephone Encounter (Signed)
FYI

## 2016-02-26 NOTE — Telephone Encounter (Signed)
Pt called stating he has an appointment with Dr Burnetta Sabin on 03/04/16.  Pt wanted to let Dr Rosanna Randy know about this appointment/MW

## 2016-03-04 ENCOUNTER — Other Ambulatory Visit: Payer: Self-pay | Admitting: Cardiovascular Disease

## 2016-03-04 ENCOUNTER — Other Ambulatory Visit: Payer: Self-pay

## 2016-03-04 DIAGNOSIS — J310 Chronic rhinitis: Secondary | ICD-10-CM | POA: Diagnosis not present

## 2016-03-04 DIAGNOSIS — R220 Localized swelling, mass and lump, head: Secondary | ICD-10-CM | POA: Diagnosis not present

## 2016-03-04 MED ORDER — METOPROLOL SUCCINATE ER 50 MG PO TB24: ORAL_TABLET | ORAL | Status: DC

## 2016-03-05 DIAGNOSIS — J342 Deviated nasal septum: Secondary | ICD-10-CM | POA: Diagnosis not present

## 2016-03-05 DIAGNOSIS — R1900 Intra-abdominal and pelvic swelling, mass and lump, unspecified site: Secondary | ICD-10-CM | POA: Diagnosis not present

## 2016-03-05 DIAGNOSIS — R220 Localized swelling, mass and lump, head: Secondary | ICD-10-CM | POA: Diagnosis not present

## 2016-03-05 DIAGNOSIS — G319 Degenerative disease of nervous system, unspecified: Secondary | ICD-10-CM | POA: Diagnosis not present

## 2016-03-06 ENCOUNTER — Ambulatory Visit (INDEPENDENT_AMBULATORY_CARE_PROVIDER_SITE_OTHER): Payer: Medicare Other | Admitting: Cardiovascular Disease

## 2016-03-06 ENCOUNTER — Encounter: Payer: Self-pay | Admitting: Cardiovascular Disease

## 2016-03-06 VITALS — BP 140/80 | HR 84 | Ht 70.0 in | Wt 186.8 lb

## 2016-03-06 DIAGNOSIS — E785 Hyperlipidemia, unspecified: Secondary | ICD-10-CM | POA: Diagnosis not present

## 2016-03-06 DIAGNOSIS — E7800 Pure hypercholesterolemia, unspecified: Secondary | ICD-10-CM

## 2016-03-06 DIAGNOSIS — I358 Other nonrheumatic aortic valve disorders: Secondary | ICD-10-CM | POA: Diagnosis not present

## 2016-03-06 DIAGNOSIS — R011 Cardiac murmur, unspecified: Secondary | ICD-10-CM

## 2016-03-06 DIAGNOSIS — I2510 Atherosclerotic heart disease of native coronary artery without angina pectoris: Secondary | ICD-10-CM

## 2016-03-06 DIAGNOSIS — I10 Essential (primary) hypertension: Secondary | ICD-10-CM

## 2016-03-06 MED ORDER — METOPROLOL SUCCINATE ER 50 MG PO TB24: ORAL_TABLET | ORAL | Status: DC

## 2016-03-06 NOTE — Patient Instructions (Addendum)
Your physician has requested that you have an echocardiogram. Echocardiography is a painless test that uses sound waves to create images of your heart. It provides your doctor with information about the size and shape of your heart and how well your heart's chambers and valves are working. This procedure takes approximately one hour. There are no restrictions for this procedure.  THIS WILL BE SCHEDULED NEXT Monday OR Tuesday.  Your physician recommends that you return for lab work fasting.  Your physician has requested that you have en exercise stress myoview. For further information please visit [email or web address]. Please follow instruction sheet, as given.  Your physician recommends that you schedule a follow-up appointment in: PENDING NEED FOR SURGERY.

## 2016-03-09 ENCOUNTER — Ambulatory Visit (HOSPITAL_COMMUNITY)
Admission: RE | Admit: 2016-03-09 | Discharge: 2016-03-09 | Disposition: A | Discharge status: Home / Self Care | Payer: Medicare Other | Source: Ambulatory Visit | Attending: Cardiovascular Disease | Admitting: Cardiovascular Disease

## 2016-03-09 DIAGNOSIS — I351 Nonrheumatic aortic (valve) insufficiency: Secondary | ICD-10-CM | POA: Insufficient documentation

## 2016-03-09 DIAGNOSIS — I252 Old myocardial infarction: Secondary | ICD-10-CM | POA: Insufficient documentation

## 2016-03-09 DIAGNOSIS — I119 Hypertensive heart disease without heart failure: Secondary | ICD-10-CM | POA: Diagnosis not present

## 2016-03-09 DIAGNOSIS — E785 Hyperlipidemia, unspecified: Secondary | ICD-10-CM | POA: Diagnosis not present

## 2016-03-09 DIAGNOSIS — I358 Other nonrheumatic aortic valve disorders: Secondary | ICD-10-CM | POA: Insufficient documentation

## 2016-03-09 DIAGNOSIS — I371 Nonrheumatic pulmonary valve insufficiency: Secondary | ICD-10-CM | POA: Insufficient documentation

## 2016-03-09 DIAGNOSIS — I340 Nonrheumatic mitral (valve) insufficiency: Secondary | ICD-10-CM | POA: Diagnosis not present

## 2016-03-09 DIAGNOSIS — I2510 Atherosclerotic heart disease of native coronary artery without angina pectoris: Secondary | ICD-10-CM | POA: Insufficient documentation

## 2016-03-09 DIAGNOSIS — R011 Cardiac murmur, unspecified: Secondary | ICD-10-CM | POA: Insufficient documentation

## 2016-03-09 DIAGNOSIS — I10 Essential (primary) hypertension: Secondary | ICD-10-CM | POA: Diagnosis not present

## 2016-03-09 NOTE — Progress Notes (Signed)
  Echocardiogram 2D Echocardiogram has been performed.  Elijah Peters 03/09/2016, 9:51 AM

## 2016-03-10 ENCOUNTER — Other Ambulatory Visit: Payer: Self-pay | Admitting: Cardiovascular Disease

## 2016-03-10 ENCOUNTER — Encounter: Payer: Self-pay | Admitting: Cardiovascular Disease

## 2016-03-10 DIAGNOSIS — I10 Essential (primary) hypertension: Secondary | ICD-10-CM | POA: Diagnosis not present

## 2016-03-10 DIAGNOSIS — E7800 Pure hypercholesterolemia, unspecified: Secondary | ICD-10-CM | POA: Diagnosis not present

## 2016-03-10 DIAGNOSIS — E785 Hyperlipidemia, unspecified: Secondary | ICD-10-CM | POA: Diagnosis not present

## 2016-03-10 DIAGNOSIS — I2510 Atherosclerotic heart disease of native coronary artery without angina pectoris: Secondary | ICD-10-CM | POA: Diagnosis not present

## 2016-03-10 NOTE — Progress Notes (Signed)
Patient ID: Rashad Auld, male   DOB: 1941/08/07, 75 y.o.   MRN: [id number]      Primary MD: Dr. Miguel Aschoff  HPI: Demitrious Mccannon is a 75 y.o. male who presents to the office for a one year cardiology followup evaluation  Mr. Burdell has established CAD and underwent initial PCI to his LAD in 1992 while living in [place]. In January 1998, he underwent insertion of 2 stents in his left circumflex coronary artery by me. He has documented mild aortic valve sclerosis, mild concentric LV hypertrophy and normal systolic and diastolic function. Has a history of hyperlipidemia requiring combination therapy and mild hypertension.   His last nuclear perfusion study was in November 2013 and continued to show normal perfusion. A follow-up echo Doppler study in March 2015  showed an ejection fraction at 55-60% with grade 1 diastolic dysfunction.  His aortic valve is mildly thickened and mildly calcified without restricted mobility.  There was no stenosis.  There was trivial AR.  He did have mild MR.  His left atrium was mildly dilated.  There is trivial TR.  Pulmonary pressures were normal  Do to insurance  Issues Crestor was switched atorvastatin 40 mg and he has continued to take Zetia 10 mg.  Laboratory i n February 2015 showed a total cholesterol of 104 triglycerides 73 HDL cholesterol 35 LDL cholesterol 54 and VLDL cholesterol 13. He had a normal chemistry profile and glucose was minimally increased at 102. CBC was normal.  When I last saw him he was  walking 1/2 miles per day which was down from previous 2-3 miles per day.  He denied any exertionally precipitated chest tightness.  He does note some fatigability.  He has prostate seeds in place for prostate CA with his PSA being very stable and low.   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 Sandy Salaam  at [facility].  He is scheduled to undergo a biopsy of this sinus mass on Thursday, 03/12/2016.  He will then have a  follow-up consultation one week later at which time plans for more significant surgery will be discussed.  Presently, from a heart standpoint, Mr. Birks has remained stable.  He specifically denies recurrent chest pain symptoms.  He denies PND, orthopnea.  He denies palpitations.  Is told to hold his aspirin 10 days prior to surgery.  He presents for evaluation.  Past Medical History  Diagnosis Date  . CAD (coronary artery disease)   . LVH (left ventricular hypertrophy)     mild, concentric  . Mild aortic sclerosis ([place])   . Peyronie's disease   . Hypertension   . Myocardial infarction ([place]) 08/1996    non-Q-wave inferolateral   . Hyperlipidemia   . Prostate cancer ([place])   . History of nuclear stress test 06/2012    normal pattern of perfusion; low risk scan    Past Surgical History  Procedure Laterality Date  . Cardiac catheterization  1992    PTCA to LAD, in [place]  . Coronary angioplasty with stent placement  1998    L circumflex - 3.0x23.9x9 bare metal stent  . Transthoracic echocardiogram  09/01/2011    EF=>55%; mild conc LVH; borderline RV enlargement; LA mildly dilated; mild mitral annular calcif; mild-mod MR; RV systolic pressure elevated; AV mildly sclerotic; mild AV regurg; aortic root sclerosis/calcif  . Eye surgery  2011    Cataracts removed.  . Prostate surgery  03/2009    seed implant due  to prostate cancer  . Hernia repair  2004  . Tonsillectomy  childhood    Allergies  Allergen Reactions  . Niacin And Related     Higher doses of Niaspan    Current Outpatient Prescriptions  Medication Sig Dispense Refill  . aspirin 81 MG tablet Take by mouth.    Marland Kitchen atorvastatin (LIPITOR) 40 MG tablet TAKE 1 TABLET DAILY 90 tablet 0  . B COMPLEX VITAMINS PO Take by mouth.    . COD LIVER OIL PO Take by mouth.    . Coenzyme Q10 (CO Q10) 200 MG CAPS Take by mouth.    . cyclobenzaprine (FLEXERIL) 10 MG tablet Take 1 tablet (10 mg total) by mouth daily as needed for muscle  spasms. 90 tablet 0  . ezetimibe (ZETIA) 10 MG tablet Take 1 tablet (10 mg total) by mouth daily. 90 tablet 3  . HYDROcodone-acetaminophen (NORCO/VICODIN) 5-325 MG per tablet Take by mouth.    Vanessa Kick Ethyl 1 g CAPS Take 2 capsules by mouth 2 (two) times daily. 360 capsule 3  . metoprolol succinate (TOPROL-XL) 50 MG 24 hr tablet Take one (1) tablet (50 mg total) by mouth daily. 30 tablet 0  . naproxen sodium (ANAPROX) 220 MG tablet Take by mouth.    . Potassium 99 MG TABS Take 1 tablet by mouth daily.    . ramipril (ALTACE) 10 MG capsule Take 1 capsule (10 mg total) by mouth daily. 90 capsule 1  . ranitidine (ZANTAC) 150 MG capsule Take by mouth.     No current facility-administered medications for this visit.    Social History   Social History  . Marital Status: Married    Spouse Name: N/A  . Number of Children: 4  . Years of Education: N/A   Occupational History  . retired [employer] Topics  . Smoking status: Former Smoker -- 9 years    Quit date: 08/25/1963  . Smokeless tobacco: Never Used  . Alcohol Use: 3.5 oz/week    7 Standard drinks or equivalent per week     Comment: 2 to 3 days a week-1 to 2 drinks at a time  . Drug Use: No  . Sexual Activity: No   Other Topics Concern  . Not on file   Social History Narrative    Family History  Problem Relation Age of Onset  . Emphysema Father   . COPD Father   . Stroke Mother   . Hypertension Brother   . Hyperlipidemia Brother   . Heart disease Brother     CAD   Socially he is married has 4 children 5 grandchildren. He is retired. He walks daily. He does take an occasional glass of wine.   ROS General: Negative; No fevers, chills, or night sweats;  HEENT: Positive for recently diagnosed left maxillary sinus mass which is having some impingement inferiorly on his left eye.  No changes in  hearing, difficulty swallowing Pulmonary: Negative; No cough, wheezing, shortness of breath,  hemoptysis Cardiovascular: Negative; No chest pain, presyncope, syncope, palpitations GI: Positive for GERD; No nausea, vomiting, diarrhea, or abdominal pain GU: Negative; No dysuria, hematuria, or difficulty voiding Musculoskeletal: Negative; no myalgias, joint pain, or weakness Hematologic/Oncology: Positive for prostate CA treated with seed implantation; no easy bruising, bleeding Endocrine: Negative; no heat/cold intolerance; no diabetes Neuro: Negative; no changes in balance, headaches Skin: Negative; No rashes or skin lesions Psychiatric: Negative; No behavioral problems, depression Sleep: Negative; No snoring, daytime sleepiness, hypersomnolence,  bruxism, restless legs, hypnogognic hallucinations, no cataplexy Other comprehensive 14 point system review is negative.   PE BP 140/80 mmHg  Pulse 84  Ht 5' 10"  (1.778 m)  Wt 186 lb 12.8 oz (84.732 kg)  BMI 26.80 kg/m2   Wt Readings from Last 3 Encounters:  03/06/16 186 lb 12.8 oz (84.732 kg)  02/13/16 188 lb (85.276 kg)  10/09/15 189 lb (85.73 kg)   General: Alert, oriented, no distress.  Skin: normal turgor, no rashes HEENT: Normocephalic, atraumatic. Pupils round and reactive; sclera anicteric;no lid lag. No xanthelasma Nose without nasal septal hypertrophy Mouth/Parynx benign; Mallinpatti scale 2 Neck: No JVD, no carotid bruits; normal carotid upstroke Chest wall: No tenderness to palpation Lungs: clear to ausculatation and percussion; no wheezing or rales Heart: RRR, s1 s2 normal 2/6 systolic murmur in the aortic area compatible with his aortic sclerosis. No S3 or S4 gallop. No rubs thrills or heaves to Abdomen: soft, nontender; no hepatosplenomehaly, BS+; abdominal aorta nontender and not dilated by palpation. Back: No CVA Pulses 2+ Musculoskeletal: No joint tenderness Extremities: no clubbing cyanosis or edema, Homan's sign negative  Neurologic: grossly nonfocal Psychological: Normal affect and mood  ECG  (independently read by me): Normal sinus rhythm at 84 bpm; old inferior Q waves in leads 3 and aVF.  Normal intervals  November 2016 ECG (independently read by me): Sinus bradycardia 54, old inferior Q waves in 3 and aVF.  Normal intervals.  October 2015 ECG (independently read by me): Sinus bradycardia 55 beats per minute.  Q waves in leads 3 and aVF  Prior March 2015 ECG (independently read by me): Normal sinus rhythm at 61 beats per minute. Normal intervals. Q waves in III and F.  Prior 04/28/13 ECG: Sinus rhythm at 55 beats per minute.  LABS: BMP Latest Ref Rng 02/14/2016 10/10/2014 10/16/2013  Glucose 65 - 99 mg/dL - - 102(H)  BUN 4 - 21 mg/dL - 15 14  Creatinine 0.61 - 1.24 mg/dL 0.90 0.9 0.91  BUN/Creat Ratio 10 - 22 - - 15  Sodium 137 - 147 mmol/L - 143 141  Potassium 3.4 - 5.3 mmol/L - 5.0 4.7  Chloride 97 - 108 mmol/L - - 103  CO2 18 - 29 mmol/L - - 24  Calcium 8.6 - 10.2 mg/dL - - 9.2   Hepatic Function Latest Ref Rng 10/10/2014 10/16/2013 03/26/2009  Total Protein 6.0 - 8.5 g/dL - 6.3 6.8  Albumin 3.5 - 4.8 g/dL - 4.0 3.8  AST 14 - 40 U/L 30 22 21   ALT 10 - 40 U/L 34 20 19  Alk Phosphatase 25 - 125 U/L 95 88 54  Total Bilirubin 0.0 - 1.2 mg/dL - 0.7 1.0  Bilirubin, Direct 0.00 - 0.40 mg/dL - 0.15 -   CBC Latest Ref Rng 10/10/2014 10/16/2013 03/26/2009  WBC - 4.7 5.6 6.4  Hemoglobin 13.5 - 17.5 g/dL 14.7 15.1 14.2  Hematocrit 41 - 53 % 44 44.3 42.7  Platelets 150 - 399 K/L 181 187 227   Lab Results  Component Value Date   MCV 94 10/16/2013   MCV 96.1 03/26/2009   Lab Results  Component Value Date   TSH 1.70 10/10/2014  No results found for: HGBA1C   Lipid Panel     Component Value Date/Time   CHOL 104 10/10/2014   CHOL 104 10/16/2013 0825   TRIG 129 10/10/2014   HDL 30* 10/10/2014   HDL 35* 10/16/2013 0825   LDLCALC 48 10/10/2014   LDLCALC 54 10/16/2013 0825     -------------------------------------------------------------------  Echo Doppler study  03/09/2016  Study Conclusions  - Left ventricle: Global longitudinal LV strain is normal at -19%  The cavity size was normal. There was moderate focal basal and  mild concentric hypertrophy. Systolic function was normal. The  estimated ejection fraction was in the range of 60% to 65%. Wall  motion was normal; there were no regional wall motion  abnormalities. There was an increased relative contribution of  atrial contraction to ventricular filling. Doppler parameters are  consistent with abnormal left ventricular relaxation (grade 1  diastolic dysfunction). - Aortic valve: Moderately calcified annulus. Trileaflet. Mild  diffuse calcification, consistent with sclerosis. There was  trivial regurgitation. - Mitral valve: Calcified annulus. There was mild regurgitation. - Pulmonic valve: There was trivial regurgitation.    ASSESSMENT AND PLAN: Mr. Massenburg is a 64 -year-old white male who is 25 years following initial intervention to his LAD and 19 years following successful stenting with bare-metal stents to his left circumflex coronary artery.  We have been treating him very aggressively with reference to his lipid studies.  Laboratory in March 2014 showed an LDL particle number at  737 ; total cholesterol 75 triglycerides 58 and HDL particle number low at 29.2. He is not having any anginal symptoms. He denies shortness of breath and continues to exercise regularly.  His blood pressure remains controlled on his current therapy consisting of ramipril, 10 mg and Toprol-XL 50 mg daily.  He is on Zetia 10 mg and Lipitor 40 mg for his hyperlipidemia and his last LDL level in February 2016 was excellent at 48.  He was recently found to have a left maxillary sinus mass which may be causing pressure inferiorly on his left eye.  He was referred to Dr. Darene Lamer at [facility] and is in need for a sinus biopsy for 03/12/2016.  I have recommended that he undergo a 2-D echo Doppler study prior to his sinus  biopsy to further evaluate his systolic murmur.  This was completed on July 17.  Following this office visit.  The echo Doppler study continues to show normal systolic function with an ejection fraction of 60-65%.  There was aortic sclerosis without stenosis.  There was mitral annular calcification with mild MR.   His aspirin has been on hold for his upcoming sinus biopsy.  I also instructed that he not take Naprosyn which she has on his medication list, but has not been taking.  I have also recommended a complete set of follow-up laboratory in the fasting state on his current medical regimen.  His last exercise nuclear study was in November 2013.  If he does require extensive reconstructive maxillofacial surgery  I have recommended that he undergo a follow-up exercise nuclear perfusion study preoperatively to make certain he is not having any silent ischemia and further evaluate his coronary obstructive disease.    Time spent: 25 minutes  Troy Sine, MD, [facility]  03/10/2016 10:18 AM

## 2016-03-11 ENCOUNTER — Telehealth: Payer: Self-pay | Admitting: *Deleted

## 2016-03-11 ENCOUNTER — Encounter: Payer: Self-pay | Admitting: *Deleted

## 2016-03-11 LAB — COMPREHENSIVE METABOLIC PANEL
A/G RATIO: 1.7 (ref 1.2–2.2)
ALBUMIN: 4.1 g/dL (ref 3.5–4.8)
ALT: 18 IU/L (ref 0–44)
AST: 20 IU/L (ref 0–40)
Alkaline Phosphatase: 92 IU/L (ref 39–117)
BUN / CREAT RATIO: 19 (ref 10–24)
BUN: 17 mg/dL (ref 8–27)
Bilirubin Total: 1.1 mg/dL (ref 0.0–1.2)
CALCIUM: 8.9 mg/dL (ref 8.6–10.2)
CO2: 24 mmol/L (ref 18–29)
CREATININE: 0.88 mg/dL (ref 0.76–1.27)
Chloride: 99 mmol/L (ref 96–106)
GFR, EST AFRICAN AMERICAN: 96 mL/min/{1.73_m2} (ref >59)
GFR, EST NON AFRICAN AMERICAN: 83 mL/min/{1.73_m2} (ref >59)
GLOBULIN, TOTAL: 2.4 g/dL (ref 1.5–4.5)
Glucose: 97 mg/dL (ref 65–99)
POTASSIUM: 4.5 mmol/L (ref 3.5–5.2)
SODIUM: 138 mmol/L (ref 134–144)
TOTAL PROTEIN: 6.5 g/dL (ref 6.0–8.5)

## 2016-03-11 LAB — CBC WITH DIFFERENTIAL/PLATELET
BASOS: 1 %
Basophils Absolute: 0 10*3/uL (ref 0.0–0.2)
EOS (ABSOLUTE): 0.1 10*3/uL (ref 0.0–0.4)
EOS: 1 %
HEMATOCRIT: 42.6 % (ref 37.5–51.0)
HEMOGLOBIN: 14.2 g/dL (ref 12.6–17.7)
IMMATURE GRANULOCYTES: 0 %
Immature Grans (Abs): 0 10*3/uL (ref 0.0–0.1)
LYMPHS ABS: 2.2 10*3/uL (ref 0.7–3.1)
Lymphs: 40 %
MCH: 31.3 pg (ref 26.6–33.0)
MCHC: 33.3 g/dL (ref 31.5–35.7)
MCV: 94 fL (ref 79–97)
MONOCYTES: 8 %
MONOS ABS: 0.4 10*3/uL (ref 0.1–0.9)
Neutrophils Absolute: 2.9 10*3/uL (ref 1.4–7.0)
Neutrophils: 50 %
Platelets: 184 10*3/uL (ref 150–379)
RBC: 4.54 x10E6/uL (ref 4.14–5.80)
RDW: 12.9 % (ref 12.3–15.4)
WBC: 5.6 10*3/uL (ref 3.4–10.8)

## 2016-03-11 LAB — TSH: TSH: 1.59 u[IU]/mL (ref 0.450–4.500)

## 2016-03-11 LAB — LIPID PANEL W/O CHOL/HDL RATIO
Cholesterol, Total: 102 mg/dL (ref 100–199)
HDL: 29 mg/dL — ABNORMAL LOW (ref >39)
LDL CALC: 50 mg/dL (ref 0–99)
TRIGLYCERIDES: 113 mg/dL (ref 0–149)
VLDL Cholesterol Cal: 23 mg/dL (ref 5–40)

## 2016-03-11 NOTE — Telephone Encounter (Signed)
Faxed surgical clearance to [facility]. Cleared by Dr Oval Linsey after review of echo and Dr Claiborne Billings last office note.

## 2016-03-12 DIAGNOSIS — J349 Unspecified disorder of nose and nasal sinuses: Secondary | ICD-10-CM | POA: Diagnosis not present

## 2016-03-12 DIAGNOSIS — R220 Localized swelling, mass and lump, head: Secondary | ICD-10-CM | POA: Diagnosis not present

## 2016-03-12 DIAGNOSIS — R918 Other nonspecific abnormal finding of lung field: Secondary | ICD-10-CM | POA: Diagnosis not present

## 2016-03-12 DIAGNOSIS — K219 Gastro-esophageal reflux disease without esophagitis: Secondary | ICD-10-CM | POA: Diagnosis present

## 2016-03-12 DIAGNOSIS — Z85828 Personal history of other malignant neoplasm of skin: Secondary | ICD-10-CM | POA: Diagnosis not present

## 2016-03-12 DIAGNOSIS — C8590 Non-Hodgkin lymphoma, unspecified, unspecified site: Secondary | ICD-10-CM | POA: Diagnosis not present

## 2016-03-12 DIAGNOSIS — C8331 Diffuse large B-cell lymphoma, lymph nodes of head, face, and neck: Secondary | ICD-10-CM | POA: Diagnosis not present

## 2016-03-12 DIAGNOSIS — Z7982 Long term (current) use of aspirin: Secondary | ICD-10-CM | POA: Diagnosis not present

## 2016-03-12 DIAGNOSIS — C8330 Diffuse large B-cell lymphoma, unspecified site: Secondary | ICD-10-CM | POA: Diagnosis not present

## 2016-03-12 DIAGNOSIS — Z79891 Long term (current) use of opiate analgesic: Secondary | ICD-10-CM | POA: Diagnosis not present

## 2016-03-12 DIAGNOSIS — I2510 Atherosclerotic heart disease of native coronary artery without angina pectoris: Secondary | ICD-10-CM | POA: Diagnosis present

## 2016-03-12 DIAGNOSIS — I672 Cerebral atherosclerosis: Secondary | ICD-10-CM | POA: Diagnosis not present

## 2016-03-13 ENCOUNTER — Encounter: Payer: Self-pay | Admitting: Cardiovascular Disease

## 2016-03-19 ENCOUNTER — Telehealth (HOSPITAL_COMMUNITY): Payer: Self-pay

## 2016-03-19 NOTE — Telephone Encounter (Signed)
Pt states has Oncology appointment late tomorrow afternoon and may need to reschedule this appointment. I did encourage pt to call back with schedule change as soon as possible.  Encounter complete.

## 2016-03-20 ENCOUNTER — Telehealth: Payer: Self-pay | Admitting: Cardiovascular Disease

## 2016-03-20 DIAGNOSIS — J310 Chronic rhinitis: Secondary | ICD-10-CM | POA: Diagnosis not present

## 2016-03-20 NOTE — Telephone Encounter (Signed)
Please call,trying to make a decision about whether to have his stress test on Tuesday. Please call today if possible.

## 2016-03-20 NOTE — Telephone Encounter (Signed)
Spoke with patient He has lymphoma - he is getting set up for radiation/chemo & he expects this to start soon - possibly Monday  Patient was supposed to have exercise nuc pre-operatively but he has already had surgery   Patient wanted to know if chemo would interact with anything he is given during the stress test - advised that I do not believe so.   Informed him I will route the message to someone who works in the stress test department in case they need to follow up as well

## 2016-03-23 ENCOUNTER — Telehealth: Payer: Self-pay | Admitting: *Deleted

## 2016-03-23 NOTE — Telephone Encounter (Signed)
Called and notified patient of echo and lab results. He voiced understanding.

## 2016-03-23 NOTE — Telephone Encounter (Signed)
-----   Message from Troy Sine, MD sent at 03/17/2016 11:20 PM EDT ----- Nl EF; AC sclerosis without stenosis; mild MR

## 2016-03-24 ENCOUNTER — Ambulatory Visit (HOSPITAL_COMMUNITY)
Admission: RE | Admit: 2016-03-24 | Discharge: 2016-03-24 | Disposition: A | Discharge status: Home / Self Care | Payer: Medicare Other | Source: Ambulatory Visit | Attending: Cardiology | Admitting: Cardiology

## 2016-03-24 DIAGNOSIS — I10 Essential (primary) hypertension: Secondary | ICD-10-CM

## 2016-03-24 DIAGNOSIS — I2510 Atherosclerotic heart disease of native coronary artery without angina pectoris: Secondary | ICD-10-CM | POA: Diagnosis not present

## 2016-03-24 DIAGNOSIS — Z8249 Family history of ischemic heart disease and other diseases of the circulatory system: Secondary | ICD-10-CM | POA: Diagnosis not present

## 2016-03-24 DIAGNOSIS — Z87891 Personal history of nicotine dependence: Secondary | ICD-10-CM | POA: Insufficient documentation

## 2016-03-24 LAB — MYOCARDIAL PERFUSION IMAGING
CHL CUP MPHR: 144 {beats}/min
CHL CUP RESTING HR STRESS: 59 {beats}/min
CSEPED: 8 min
CSEPEDS: 17 s
CSEPHR: 93 %
Estimated workload: 9.4 METS
LV sys vol: 35 mL
LVDIAVOL: 93 mL (ref 62–150)
Peak HR: 134 {beats}/min
RPE: 17
SDS: 1
SRS: 1
SSS: 2
TID: 1.02

## 2016-03-24 MED ORDER — TECHNETIUM TC 99M TETROFOSMIN IV KIT
30.1000 | PACK | Freq: Once | INTRAVENOUS | Status: AC | PRN
  Administered 2016-03-24: 30.1 via INTRAVENOUS
  Filled 2016-03-24: qty 30

## 2016-03-24 MED ORDER — TECHNETIUM TC 99M TETROFOSMIN IV KIT
10.8000 | PACK | Freq: Once | INTRAVENOUS | Status: AC | PRN
  Administered 2016-03-24: 11 via INTRAVENOUS
  Filled 2016-03-24: qty 11

## 2016-03-26 DIAGNOSIS — I119 Hypertensive heart disease without heart failure: Secondary | ICD-10-CM | POA: Diagnosis not present

## 2016-03-26 DIAGNOSIS — K219 Gastro-esophageal reflux disease without esophagitis: Secondary | ICD-10-CM | POA: Diagnosis not present

## 2016-03-26 DIAGNOSIS — Z7952 Long term (current) use of systemic steroids: Secondary | ICD-10-CM | POA: Diagnosis not present

## 2016-03-26 DIAGNOSIS — Z7982 Long term (current) use of aspirin: Secondary | ICD-10-CM | POA: Diagnosis not present

## 2016-03-26 DIAGNOSIS — Z792 Long term (current) use of antibiotics: Secondary | ICD-10-CM | POA: Diagnosis not present

## 2016-03-26 DIAGNOSIS — Z87891 Personal history of nicotine dependence: Secondary | ICD-10-CM | POA: Diagnosis not present

## 2016-03-26 DIAGNOSIS — C8331 Diffuse large B-cell lymphoma, lymph nodes of head, face, and neck: Secondary | ICD-10-CM | POA: Diagnosis not present

## 2016-03-26 DIAGNOSIS — C8339 Diffuse large B-cell lymphoma, extranodal and solid organ sites: Secondary | ICD-10-CM | POA: Diagnosis not present

## 2016-03-26 DIAGNOSIS — Z8679 Personal history of other diseases of the circulatory system: Secondary | ICD-10-CM | POA: Insufficient documentation

## 2016-03-26 DIAGNOSIS — Z8546 Personal history of malignant neoplasm of prostate: Secondary | ICD-10-CM | POA: Diagnosis not present

## 2016-03-26 DIAGNOSIS — I2510 Atherosclerotic heart disease of native coronary artery without angina pectoris: Secondary | ICD-10-CM | POA: Diagnosis not present

## 2016-03-26 DIAGNOSIS — H532 Diplopia: Secondary | ICD-10-CM | POA: Diagnosis not present

## 2016-03-26 DIAGNOSIS — Z79899 Other long term (current) drug therapy: Secondary | ICD-10-CM | POA: Diagnosis not present

## 2016-03-26 DIAGNOSIS — E7800 Pure hypercholesterolemia, unspecified: Secondary | ICD-10-CM | POA: Diagnosis not present

## 2016-03-26 DIAGNOSIS — Z01812 Encounter for preprocedural laboratory examination: Secondary | ICD-10-CM | POA: Diagnosis not present

## 2016-03-30 DIAGNOSIS — C8330 Diffuse large B-cell lymphoma, unspecified site: Secondary | ICD-10-CM | POA: Diagnosis not present

## 2016-04-02 DIAGNOSIS — Z8546 Personal history of malignant neoplasm of prostate: Secondary | ICD-10-CM | POA: Diagnosis not present

## 2016-04-02 DIAGNOSIS — I2510 Atherosclerotic heart disease of native coronary artery without angina pectoris: Secondary | ICD-10-CM | POA: Diagnosis not present

## 2016-04-02 DIAGNOSIS — Z452 Encounter for adjustment and management of vascular access device: Secondary | ICD-10-CM | POA: Diagnosis not present

## 2016-04-02 DIAGNOSIS — I10 Essential (primary) hypertension: Secondary | ICD-10-CM | POA: Diagnosis not present

## 2016-04-02 DIAGNOSIS — Z955 Presence of coronary angioplasty implant and graft: Secondary | ICD-10-CM | POA: Diagnosis not present

## 2016-04-02 DIAGNOSIS — C8330 Diffuse large B-cell lymphoma, unspecified site: Secondary | ICD-10-CM | POA: Diagnosis not present

## 2016-04-08 ENCOUNTER — Ambulatory Visit (INDEPENDENT_AMBULATORY_CARE_PROVIDER_SITE_OTHER): Payer: Medicare Other | Admitting: Family Medicine

## 2016-04-08 ENCOUNTER — Encounter: Payer: Self-pay | Admitting: Family Medicine

## 2016-04-08 VITALS — BP 116/78 | HR 64 | Temp 98.8°F | Resp 16 | Wt 187.0 lb

## 2016-04-08 DIAGNOSIS — I10 Essential (primary) hypertension: Secondary | ICD-10-CM | POA: Diagnosis not present

## 2016-04-08 DIAGNOSIS — E785 Hyperlipidemia, unspecified: Secondary | ICD-10-CM

## 2016-04-08 DIAGNOSIS — I2510 Atherosclerotic heart disease of native coronary artery without angina pectoris: Secondary | ICD-10-CM

## 2016-04-08 NOTE — Progress Notes (Signed)
Patient: Elijah Peters Male    DOB: 1940/10/26   75 y.o.   MRN: [id number] Visit Date: 04/08/2016  Today's Provider: [name], MD   Chief Complaint  Patient presents with  . Hypertension    6 month follow up   . Hyperlipidemia   Subjective:    HPI  Hypertension, follow-up:  BP Readings from Last 3 Encounters:  04/08/16 116/78  03/06/16 140/80  02/13/16 (!) 158/66    He was last seen for hypertension 6 months ago.  BP at that visit was 140/80. Management since that visit includes no changes. He reports good compliance with treatment. He is not having side effects.  He is exercising. He is adherent to low salt diet.   Outside blood pressures are not being checked. Patient reports that BP is usually stable.  Patient denies chest pain, exertional chest pressure/discomfort, palpitations and tachypnea.   Cardiovascular risk factors include dyslipidemia.     Weight trend: stable Wt Readings from Last 3 Encounters:  04/08/16 187 lb (84.8 kg)  03/24/16 186 lb (84.4 kg)  03/06/16 186 lb 12.8 oz (84.7 kg)    Current diet: well balanced   Lipid/Cholesterol, Follow-up:   Last seen for this6 months ago.  Management changes since that visit include no changes. . Last Lipid Panel:    Component Value Date/Time   CHOL 102 03/10/2016 1237   TRIG 113 03/10/2016 1237   HDL 29 (L) 03/10/2016 1237   LDLCALC 50 03/10/2016 1237      He reports good compliance with treatment. He is not having side effects.  Current symptoms include none and have been stable. Weight trend: stable Prior visit with dietician: no Current diet: well balanced Current exercise: walking  Wt Readings from Last 3 Encounters:  04/08/16 187 lb (84.8 kg)  03/24/16 186 lb (84.4 kg)  03/06/16 186 lb 12.8 oz (84.7 kg)      Patient also mentions that his is going to be starting chemotherapy soon. He wanted to make sure that you will be receiving all of his information at [facility].     Allergies  Allergen Reactions  . Niacin And Related     Higher doses of Niaspan   Current Meds  Medication Sig  . aspirin 81 MG tablet Take by mouth.  Marland Kitchen atorvastatin (LIPITOR) 40 MG tablet TAKE 1 TABLET DAILY  . B COMPLEX VITAMINS PO Take by mouth.  . COD LIVER OIL PO Take by mouth.  . Coenzyme Q10 (CO Q10) 200 MG CAPS Take by mouth.  . cyclobenzaprine (FLEXERIL) 10 MG tablet Take 1 tablet (10 mg total) by mouth daily as needed for muscle spasms.  Marland Kitchen ezetimibe (ZETIA) 10 MG tablet Take 1 tablet (10 mg total) by mouth daily.  Marland Kitchen HYDROcodone-acetaminophen (NORCO/VICODIN) 5-325 MG per tablet Take by mouth.  Vanessa Kick Ethyl 1 g CAPS Take 2 capsules by mouth 2 (two) times daily.  . metoprolol succinate (TOPROL-XL) 50 MG 24 hr tablet Take one (1) tablet (50 mg total) by mouth daily.  . naproxen sodium (ANAPROX) 220 MG tablet Take by mouth.  . Potassium 99 MG TABS Take 1 tablet by mouth daily.  . ramipril (ALTACE) 10 MG capsule Take 1 capsule (10 mg total) by mouth daily.  . ranitidine (ZANTAC) 150 MG capsule Take by mouth.    Review of Systems  Constitutional: Negative.   HENT:       Patient is getting ready to have treatment for his cancer  of the left maxillary sinus  Respiratory: Negative.   Cardiovascular: Negative.   Musculoskeletal: Negative.   Neurological: Negative.   Psychiatric/Behavioral: Negative.     Social History  Substance Use Topics  . Smoking status: Former Smoker    Years: 9.00    Quit date: 08/25/1963  . Smokeless tobacco: Never Used  . Alcohol use 3.5 oz/week    7 Standard drinks or equivalent per week     Comment: 2 to 3 days a week-1 to 2 drinks at a time   Objective:   BP 116/78 (BP Location: Left Arm, Patient Position: Sitting, Cuff Size: Normal)   Pulse 64   Temp 98.8 F (37.1 C)   Resp 16   Wt 187 lb (84.8 kg)   BMI 26.83 kg/m   Physical Exam  Constitutional: He is oriented to person, place, and time. He appears well-developed and  well-nourished.  HENT:  Head: Normocephalic and atraumatic.  Right Ear: External ear normal.  Left Ear: External ear normal.  Nose: Nose normal.  Exam today of this maxillary sinus and area under both eyes is normal.  Eyes: Conjunctivae are normal.  Neck: Neck supple.  Cardiovascular: Normal rate, regular rhythm and normal heart sounds.   Pulmonary/Chest: Effort normal and breath sounds normal.  Abdominal: Soft. Bowel sounds are normal.  Neurological: He is alert and oriented to person, place, and time.  Skin: Skin is warm and dry.  Psychiatric: He has a normal mood and affect. His behavior is normal. Judgment and thought content normal.        Assessment & Plan:     1. Essential (primary) hypertension   2. Hyperlipidemia with target LDL less than 70  3. 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       Hamid Brookens Cranford Mon, MD  [facility]

## 2016-04-09 DIAGNOSIS — K219 Gastro-esophageal reflux disease without esophagitis: Secondary | ICD-10-CM | POA: Diagnosis not present

## 2016-04-09 DIAGNOSIS — Z955 Presence of coronary angioplasty implant and graft: Secondary | ICD-10-CM | POA: Diagnosis not present

## 2016-04-09 DIAGNOSIS — Z5112 Encounter for antineoplastic immunotherapy: Secondary | ICD-10-CM | POA: Diagnosis not present

## 2016-04-09 DIAGNOSIS — I119 Hypertensive heart disease without heart failure: Secondary | ICD-10-CM | POA: Diagnosis not present

## 2016-04-09 DIAGNOSIS — Z8546 Personal history of malignant neoplasm of prostate: Secondary | ICD-10-CM | POA: Diagnosis not present

## 2016-04-09 DIAGNOSIS — E7800 Pure hypercholesterolemia, unspecified: Secondary | ICD-10-CM | POA: Diagnosis not present

## 2016-04-09 DIAGNOSIS — Z79899 Other long term (current) drug therapy: Secondary | ICD-10-CM | POA: Diagnosis not present

## 2016-04-09 DIAGNOSIS — Z888 Allergy status to other drugs, medicaments and biological substances status: Secondary | ICD-10-CM | POA: Diagnosis not present

## 2016-04-09 DIAGNOSIS — Z95828 Presence of other vascular implants and grafts: Secondary | ICD-10-CM | POA: Diagnosis not present

## 2016-04-09 DIAGNOSIS — I2510 Atherosclerotic heart disease of native coronary artery without angina pectoris: Secondary | ICD-10-CM | POA: Diagnosis not present

## 2016-04-09 DIAGNOSIS — Z5111 Encounter for antineoplastic chemotherapy: Secondary | ICD-10-CM | POA: Diagnosis not present

## 2016-04-09 DIAGNOSIS — C8339 Diffuse large B-cell lymphoma, extranodal and solid organ sites: Secondary | ICD-10-CM | POA: Diagnosis not present

## 2016-04-10 ENCOUNTER — Telehealth: Payer: Self-pay | Admitting: Family Medicine

## 2016-04-10 ENCOUNTER — Telehealth: Payer: Self-pay | Admitting: Cardiovascular Disease

## 2016-04-10 NOTE — Telephone Encounter (Signed)
Returned call to patient he stated he wanted Dr.Kelly to know bone marrow came back negative.Also oncologist advised to decrease lovaza to 1 gm daily.He wanted to make sure ok with Dr.Kelly.Message sent to [facility].

## 2016-04-10 NOTE — Telephone Encounter (Signed)
New message  Pt call wanted to inform Dr. Claiborne Billings of his bone marrow biopsy came back negative.pt states it was all localized. Please call back to discuss of needed

## 2016-04-10 NOTE — Telephone Encounter (Signed)
Pt called wanting to let Dr. Rosanna Randy know that his bone marrow biopsy came back completely negative.  You dont have to call him back just FYI.  Thanks, [employer]

## 2016-04-12 NOTE — Telephone Encounter (Signed)
Great; ok to decrease lovaza

## 2016-04-13 NOTE — Telephone Encounter (Signed)
Returned call to patient spoke to wife.Dr.Kelly advised ok to decrease lovaza.Wife wanted to wish Dr.Kelly Happy Birthday today !! Advised I will send message to him.

## 2016-04-14 DIAGNOSIS — C8339 Diffuse large B-cell lymphoma, extranodal and solid organ sites: Secondary | ICD-10-CM | POA: Diagnosis not present

## 2016-04-14 DIAGNOSIS — Z79899 Other long term (current) drug therapy: Secondary | ICD-10-CM | POA: Diagnosis not present

## 2016-04-14 NOTE — Telephone Encounter (Signed)
Wish he and his wife a happy burthday, since all 3 of [place] were born on [birth year]

## 2016-04-16 ENCOUNTER — Encounter: Payer: Self-pay | Admitting: Family Medicine

## 2016-04-17 DIAGNOSIS — C8330 Diffuse large B-cell lymphoma, unspecified site: Secondary | ICD-10-CM | POA: Diagnosis not present

## 2016-04-17 NOTE — Telephone Encounter (Signed)
Spoke to patient's wife Dr.Kelly wished them a happy birthday too!

## 2016-04-30 DIAGNOSIS — Z5111 Encounter for antineoplastic chemotherapy: Secondary | ICD-10-CM | POA: Diagnosis not present

## 2016-04-30 DIAGNOSIS — Z888 Allergy status to other drugs, medicaments and biological substances status: Secondary | ICD-10-CM | POA: Diagnosis not present

## 2016-04-30 DIAGNOSIS — Z79899 Other long term (current) drug therapy: Secondary | ICD-10-CM | POA: Diagnosis not present

## 2016-04-30 DIAGNOSIS — Z5112 Encounter for antineoplastic immunotherapy: Secondary | ICD-10-CM | POA: Diagnosis not present

## 2016-04-30 DIAGNOSIS — C8339 Diffuse large B-cell lymphoma, extranodal and solid organ sites: Secondary | ICD-10-CM | POA: Diagnosis not present

## 2016-04-30 DIAGNOSIS — R208 Other disturbances of skin sensation: Secondary | ICD-10-CM | POA: Diagnosis not present

## 2016-04-30 DIAGNOSIS — Z7952 Long term (current) use of systemic steroids: Secondary | ICD-10-CM | POA: Diagnosis not present

## 2016-05-04 DIAGNOSIS — Z5112 Encounter for antineoplastic immunotherapy: Secondary | ICD-10-CM | POA: Diagnosis not present

## 2016-05-04 DIAGNOSIS — Z5111 Encounter for antineoplastic chemotherapy: Secondary | ICD-10-CM | POA: Diagnosis not present

## 2016-05-04 DIAGNOSIS — Z79899 Other long term (current) drug therapy: Secondary | ICD-10-CM | POA: Diagnosis not present

## 2016-05-04 DIAGNOSIS — C8330 Diffuse large B-cell lymphoma, unspecified site: Secondary | ICD-10-CM | POA: Diagnosis not present

## 2016-05-04 DIAGNOSIS — C8339 Diffuse large B-cell lymphoma, extranodal and solid organ sites: Secondary | ICD-10-CM | POA: Diagnosis not present

## 2016-05-21 DIAGNOSIS — Z5111 Encounter for antineoplastic chemotherapy: Secondary | ICD-10-CM | POA: Diagnosis not present

## 2016-05-21 DIAGNOSIS — Z5112 Encounter for antineoplastic immunotherapy: Secondary | ICD-10-CM | POA: Diagnosis not present

## 2016-05-21 DIAGNOSIS — R5383 Other fatigue: Secondary | ICD-10-CM | POA: Diagnosis not present

## 2016-05-21 DIAGNOSIS — C8339 Diffuse large B-cell lymphoma, extranodal and solid organ sites: Secondary | ICD-10-CM | POA: Diagnosis not present

## 2016-05-21 DIAGNOSIS — Z9842 Cataract extraction status, left eye: Secondary | ICD-10-CM | POA: Diagnosis not present

## 2016-05-21 DIAGNOSIS — J329 Chronic sinusitis, unspecified: Secondary | ICD-10-CM | POA: Diagnosis not present

## 2016-05-21 DIAGNOSIS — Z888 Allergy status to other drugs, medicaments and biological substances status: Secondary | ICD-10-CM | POA: Diagnosis not present

## 2016-05-21 DIAGNOSIS — G629 Polyneuropathy, unspecified: Secondary | ICD-10-CM | POA: Diagnosis not present

## 2016-05-21 DIAGNOSIS — Z79899 Other long term (current) drug therapy: Secondary | ICD-10-CM | POA: Diagnosis not present

## 2016-05-27 DIAGNOSIS — Z79899 Other long term (current) drug therapy: Secondary | ICD-10-CM | POA: Diagnosis not present

## 2016-05-27 DIAGNOSIS — I4581 Long QT syndrome: Secondary | ICD-10-CM | POA: Diagnosis not present

## 2016-05-27 DIAGNOSIS — Z5112 Encounter for antineoplastic immunotherapy: Secondary | ICD-10-CM | POA: Diagnosis not present

## 2016-05-27 DIAGNOSIS — C8339 Diffuse large B-cell lymphoma, extranodal and solid organ sites: Secondary | ICD-10-CM | POA: Diagnosis not present

## 2016-05-27 DIAGNOSIS — I491 Atrial premature depolarization: Secondary | ICD-10-CM | POA: Diagnosis not present

## 2016-05-27 DIAGNOSIS — Z5111 Encounter for antineoplastic chemotherapy: Secondary | ICD-10-CM | POA: Diagnosis not present

## 2016-05-29 DIAGNOSIS — C8339 Diffuse large B-cell lymphoma, extranodal and solid organ sites: Secondary | ICD-10-CM | POA: Diagnosis not present

## 2016-05-29 DIAGNOSIS — Z5111 Encounter for antineoplastic chemotherapy: Secondary | ICD-10-CM | POA: Diagnosis not present

## 2016-05-29 DIAGNOSIS — Z79899 Other long term (current) drug therapy: Secondary | ICD-10-CM | POA: Diagnosis not present

## 2016-05-30 ENCOUNTER — Ambulatory Visit: Payer: Medicare Other

## 2016-06-05 DIAGNOSIS — Z9889 Other specified postprocedural states: Secondary | ICD-10-CM | POA: Diagnosis not present

## 2016-06-05 DIAGNOSIS — Z79891 Long term (current) use of opiate analgesic: Secondary | ICD-10-CM | POA: Diagnosis not present

## 2016-06-05 DIAGNOSIS — Z955 Presence of coronary angioplasty implant and graft: Secondary | ICD-10-CM | POA: Diagnosis not present

## 2016-06-05 DIAGNOSIS — Z8546 Personal history of malignant neoplasm of prostate: Secondary | ICD-10-CM | POA: Diagnosis not present

## 2016-06-05 DIAGNOSIS — Z9221 Personal history of antineoplastic chemotherapy: Secondary | ICD-10-CM | POA: Diagnosis not present

## 2016-06-05 DIAGNOSIS — Z79899 Other long term (current) drug therapy: Secondary | ICD-10-CM | POA: Diagnosis not present

## 2016-06-05 DIAGNOSIS — Z7952 Long term (current) use of systemic steroids: Secondary | ICD-10-CM | POA: Diagnosis not present

## 2016-06-05 DIAGNOSIS — C8339 Diffuse large B-cell lymphoma, extranodal and solid organ sites: Secondary | ICD-10-CM | POA: Diagnosis not present

## 2016-06-05 DIAGNOSIS — Z7982 Long term (current) use of aspirin: Secondary | ICD-10-CM | POA: Diagnosis not present

## 2016-06-05 DIAGNOSIS — I119 Hypertensive heart disease without heart failure: Secondary | ICD-10-CM | POA: Diagnosis not present

## 2016-06-05 DIAGNOSIS — K219 Gastro-esophageal reflux disease without esophagitis: Secondary | ICD-10-CM | POA: Diagnosis not present

## 2016-06-05 DIAGNOSIS — C8330 Diffuse large B-cell lymphoma, unspecified site: Secondary | ICD-10-CM | POA: Diagnosis not present

## 2016-06-05 DIAGNOSIS — I2510 Atherosclerotic heart disease of native coronary artery without angina pectoris: Secondary | ICD-10-CM | POA: Diagnosis not present

## 2016-06-05 DIAGNOSIS — Z87891 Personal history of nicotine dependence: Secondary | ICD-10-CM | POA: Diagnosis not present

## 2016-06-05 DIAGNOSIS — E7800 Pure hypercholesterolemia, unspecified: Secondary | ICD-10-CM | POA: Diagnosis not present

## 2016-06-08 DIAGNOSIS — C8339 Diffuse large B-cell lymphoma, extranodal and solid organ sites: Secondary | ICD-10-CM | POA: Diagnosis not present

## 2016-06-08 DIAGNOSIS — Z79899 Other long term (current) drug therapy: Secondary | ICD-10-CM | POA: Diagnosis not present

## 2016-06-11 DIAGNOSIS — G620 Drug-induced polyneuropathy: Secondary | ICD-10-CM | POA: Diagnosis not present

## 2016-06-11 DIAGNOSIS — C8339 Diffuse large B-cell lymphoma, extranodal and solid organ sites: Secondary | ICD-10-CM | POA: Diagnosis not present

## 2016-06-11 DIAGNOSIS — T451X5D Adverse effect of antineoplastic and immunosuppressive drugs, subsequent encounter: Secondary | ICD-10-CM | POA: Diagnosis not present

## 2016-06-11 DIAGNOSIS — C8331 Diffuse large B-cell lymphoma, lymph nodes of head, face, and neck: Secondary | ICD-10-CM | POA: Diagnosis not present

## 2016-06-11 DIAGNOSIS — R200 Anesthesia of skin: Secondary | ICD-10-CM | POA: Diagnosis not present

## 2016-06-11 DIAGNOSIS — Z79899 Other long term (current) drug therapy: Secondary | ICD-10-CM | POA: Diagnosis not present

## 2016-06-11 DIAGNOSIS — Z9221 Personal history of antineoplastic chemotherapy: Secondary | ICD-10-CM | POA: Diagnosis not present

## 2016-06-11 DIAGNOSIS — T451X5A Adverse effect of antineoplastic and immunosuppressive drugs, initial encounter: Secondary | ICD-10-CM | POA: Diagnosis not present

## 2016-06-11 DIAGNOSIS — H538 Other visual disturbances: Secondary | ICD-10-CM | POA: Diagnosis not present

## 2016-06-12 DIAGNOSIS — K219 Gastro-esophageal reflux disease without esophagitis: Secondary | ICD-10-CM | POA: Diagnosis not present

## 2016-06-12 DIAGNOSIS — Z79891 Long term (current) use of opiate analgesic: Secondary | ICD-10-CM | POA: Diagnosis not present

## 2016-06-12 DIAGNOSIS — Z7982 Long term (current) use of aspirin: Secondary | ICD-10-CM | POA: Diagnosis not present

## 2016-06-12 DIAGNOSIS — Z8546 Personal history of malignant neoplasm of prostate: Secondary | ICD-10-CM | POA: Diagnosis not present

## 2016-06-12 DIAGNOSIS — Z9889 Other specified postprocedural states: Secondary | ICD-10-CM | POA: Diagnosis not present

## 2016-06-12 DIAGNOSIS — Z87891 Personal history of nicotine dependence: Secondary | ICD-10-CM | POA: Diagnosis not present

## 2016-06-12 DIAGNOSIS — Z79899 Other long term (current) drug therapy: Secondary | ICD-10-CM | POA: Diagnosis not present

## 2016-06-12 DIAGNOSIS — E7800 Pure hypercholesterolemia, unspecified: Secondary | ICD-10-CM | POA: Diagnosis not present

## 2016-06-12 DIAGNOSIS — Z955 Presence of coronary angioplasty implant and graft: Secondary | ICD-10-CM | POA: Diagnosis not present

## 2016-06-12 DIAGNOSIS — I119 Hypertensive heart disease without heart failure: Secondary | ICD-10-CM | POA: Diagnosis not present

## 2016-06-12 DIAGNOSIS — Z7952 Long term (current) use of systemic steroids: Secondary | ICD-10-CM | POA: Diagnosis not present

## 2016-06-12 DIAGNOSIS — C8330 Diffuse large B-cell lymphoma, unspecified site: Secondary | ICD-10-CM | POA: Diagnosis not present

## 2016-06-12 DIAGNOSIS — I2510 Atherosclerotic heart disease of native coronary artery without angina pectoris: Secondary | ICD-10-CM | POA: Diagnosis not present

## 2016-06-12 DIAGNOSIS — Z9221 Personal history of antineoplastic chemotherapy: Secondary | ICD-10-CM | POA: Diagnosis not present

## 2016-06-24 DIAGNOSIS — Z510 Encounter for antineoplastic radiation therapy: Secondary | ICD-10-CM | POA: Diagnosis not present

## 2016-06-24 DIAGNOSIS — C8330 Diffuse large B-cell lymphoma, unspecified site: Secondary | ICD-10-CM | POA: Diagnosis not present

## 2016-06-29 DIAGNOSIS — Z510 Encounter for antineoplastic radiation therapy: Secondary | ICD-10-CM | POA: Diagnosis not present

## 2016-06-29 DIAGNOSIS — C8330 Diffuse large B-cell lymphoma, unspecified site: Secondary | ICD-10-CM | POA: Diagnosis not present

## 2016-06-30 DIAGNOSIS — C8339 Diffuse large B-cell lymphoma, extranodal and solid organ sites: Secondary | ICD-10-CM | POA: Diagnosis not present

## 2016-06-30 DIAGNOSIS — Z510 Encounter for antineoplastic radiation therapy: Secondary | ICD-10-CM | POA: Diagnosis not present

## 2016-06-30 DIAGNOSIS — C8330 Diffuse large B-cell lymphoma, unspecified site: Secondary | ICD-10-CM | POA: Diagnosis not present

## 2016-07-01 DIAGNOSIS — C8330 Diffuse large B-cell lymphoma, unspecified site: Secondary | ICD-10-CM | POA: Diagnosis not present

## 2016-07-01 DIAGNOSIS — Z510 Encounter for antineoplastic radiation therapy: Secondary | ICD-10-CM | POA: Diagnosis not present

## 2016-07-01 DIAGNOSIS — C8339 Diffuse large B-cell lymphoma, extranodal and solid organ sites: Secondary | ICD-10-CM | POA: Diagnosis not present

## 2016-07-02 DIAGNOSIS — Z510 Encounter for antineoplastic radiation therapy: Secondary | ICD-10-CM | POA: Diagnosis not present

## 2016-07-02 DIAGNOSIS — C8330 Diffuse large B-cell lymphoma, unspecified site: Secondary | ICD-10-CM | POA: Diagnosis not present

## 2016-07-02 DIAGNOSIS — C8339 Diffuse large B-cell lymphoma, extranodal and solid organ sites: Secondary | ICD-10-CM | POA: Diagnosis not present

## 2016-07-03 DIAGNOSIS — C8339 Diffuse large B-cell lymphoma, extranodal and solid organ sites: Secondary | ICD-10-CM | POA: Diagnosis not present

## 2016-07-03 DIAGNOSIS — Z510 Encounter for antineoplastic radiation therapy: Secondary | ICD-10-CM | POA: Diagnosis not present

## 2016-07-03 DIAGNOSIS — C8330 Diffuse large B-cell lymphoma, unspecified site: Secondary | ICD-10-CM | POA: Diagnosis not present

## 2016-07-06 DIAGNOSIS — Z510 Encounter for antineoplastic radiation therapy: Secondary | ICD-10-CM | POA: Diagnosis not present

## 2016-07-06 DIAGNOSIS — C8339 Diffuse large B-cell lymphoma, extranodal and solid organ sites: Secondary | ICD-10-CM | POA: Diagnosis not present

## 2016-07-06 DIAGNOSIS — C8330 Diffuse large B-cell lymphoma, unspecified site: Secondary | ICD-10-CM | POA: Diagnosis not present

## 2016-07-07 DIAGNOSIS — C8339 Diffuse large B-cell lymphoma, extranodal and solid organ sites: Secondary | ICD-10-CM | POA: Diagnosis not present

## 2016-07-07 DIAGNOSIS — Z510 Encounter for antineoplastic radiation therapy: Secondary | ICD-10-CM | POA: Diagnosis not present

## 2016-07-07 DIAGNOSIS — C8330 Diffuse large B-cell lymphoma, unspecified site: Secondary | ICD-10-CM | POA: Diagnosis not present

## 2016-07-08 DIAGNOSIS — Z510 Encounter for antineoplastic radiation therapy: Secondary | ICD-10-CM | POA: Diagnosis not present

## 2016-07-08 DIAGNOSIS — C8339 Diffuse large B-cell lymphoma, extranodal and solid organ sites: Secondary | ICD-10-CM | POA: Diagnosis not present

## 2016-07-08 DIAGNOSIS — C8330 Diffuse large B-cell lymphoma, unspecified site: Secondary | ICD-10-CM | POA: Diagnosis not present

## 2016-07-09 DIAGNOSIS — Z510 Encounter for antineoplastic radiation therapy: Secondary | ICD-10-CM | POA: Diagnosis not present

## 2016-07-09 DIAGNOSIS — C8330 Diffuse large B-cell lymphoma, unspecified site: Secondary | ICD-10-CM | POA: Diagnosis not present

## 2016-07-10 DIAGNOSIS — Z510 Encounter for antineoplastic radiation therapy: Secondary | ICD-10-CM | POA: Diagnosis not present

## 2016-07-10 DIAGNOSIS — C8339 Diffuse large B-cell lymphoma, extranodal and solid organ sites: Secondary | ICD-10-CM | POA: Diagnosis not present

## 2016-07-10 DIAGNOSIS — C8330 Diffuse large B-cell lymphoma, unspecified site: Secondary | ICD-10-CM | POA: Diagnosis not present

## 2016-07-13 DIAGNOSIS — Z510 Encounter for antineoplastic radiation therapy: Secondary | ICD-10-CM | POA: Diagnosis not present

## 2016-07-13 DIAGNOSIS — C8330 Diffuse large B-cell lymphoma, unspecified site: Secondary | ICD-10-CM | POA: Diagnosis not present

## 2016-07-14 DIAGNOSIS — Z510 Encounter for antineoplastic radiation therapy: Secondary | ICD-10-CM | POA: Diagnosis not present

## 2016-07-14 DIAGNOSIS — C8330 Diffuse large B-cell lymphoma, unspecified site: Secondary | ICD-10-CM | POA: Diagnosis not present

## 2016-07-15 DIAGNOSIS — C8330 Diffuse large B-cell lymphoma, unspecified site: Secondary | ICD-10-CM | POA: Diagnosis not present

## 2016-07-15 DIAGNOSIS — Z510 Encounter for antineoplastic radiation therapy: Secondary | ICD-10-CM | POA: Diagnosis not present

## 2016-07-20 DIAGNOSIS — Z510 Encounter for antineoplastic radiation therapy: Secondary | ICD-10-CM | POA: Diagnosis not present

## 2016-07-20 DIAGNOSIS — C8330 Diffuse large B-cell lymphoma, unspecified site: Secondary | ICD-10-CM | POA: Diagnosis not present

## 2016-07-21 DIAGNOSIS — Z510 Encounter for antineoplastic radiation therapy: Secondary | ICD-10-CM | POA: Diagnosis not present

## 2016-07-21 DIAGNOSIS — C8330 Diffuse large B-cell lymphoma, unspecified site: Secondary | ICD-10-CM | POA: Diagnosis not present

## 2016-08-06 DIAGNOSIS — Z23 Encounter for immunization: Secondary | ICD-10-CM | POA: Diagnosis not present

## 2016-08-14 ENCOUNTER — Other Ambulatory Visit: Payer: Self-pay

## 2016-08-14 MED ORDER — EZETIMIBE 10 MG PO TABS: 10.0000 mg | ORAL_TABLET | Freq: Every day | ORAL | 1 refills | Status: DC

## 2016-08-14 MED ORDER — METOPROLOL SUCCINATE ER 50 MG PO TB24: ORAL_TABLET | ORAL | 1 refills | Status: DC

## 2016-08-14 MED ORDER — RAMIPRIL 10 MG PO CAPS: 10.0000 mg | ORAL_CAPSULE | Freq: Every day | ORAL | 1 refills | Status: DC

## 2016-08-14 MED ORDER — ATORVASTATIN CALCIUM 40 MG PO TABS: 40.0000 mg | ORAL_TABLET | Freq: Every day | ORAL | 1 refills | Status: DC

## 2016-08-28 DIAGNOSIS — C8330 Diffuse large B-cell lymphoma, unspecified site: Secondary | ICD-10-CM | POA: Diagnosis not present

## 2016-08-28 DIAGNOSIS — C8339 Diffuse large B-cell lymphoma, extranodal and solid organ sites: Secondary | ICD-10-CM | POA: Diagnosis not present

## 2016-08-28 DIAGNOSIS — Z510 Encounter for antineoplastic radiation therapy: Secondary | ICD-10-CM | POA: Diagnosis not present

## 2016-09-07 DIAGNOSIS — H2512 Age-related nuclear cataract, left eye: Secondary | ICD-10-CM | POA: Diagnosis not present

## 2016-09-09 ENCOUNTER — Ambulatory Visit: Payer: PRIVATE HEALTH INSURANCE

## 2016-09-15 DIAGNOSIS — H2512 Age-related nuclear cataract, left eye: Secondary | ICD-10-CM | POA: Diagnosis not present

## 2016-09-17 ENCOUNTER — Ambulatory Visit: Payer: Medicare Other

## 2016-09-17 ENCOUNTER — Encounter: Payer: Self-pay | Admitting: *Deleted

## 2016-09-17 VITALS — BP 130/68 | HR 60 | Temp 98.0°F | Ht 70.0 in | Wt 179.8 lb

## 2016-09-17 DIAGNOSIS — Z0000 Encounter for general adult medical examination without abnormal findings: Secondary | ICD-10-CM | POA: Diagnosis not present

## 2016-09-17 NOTE — Progress Notes (Signed)
Subjective:   Elijah Peters is a 76 y.o. male who presents for Medicare Annual/Subsequent preventive examination.  Review of Systems:  N/A  Cardiac Risk Factors include: advanced age (>62men, >37 women);dyslipidemia;male gender;hypertension     Objective:    Vitals: BP 130/68 (BP Location: Right Arm)   Pulse 60   Temp 98 F (36.7 C) (Oral)   Ht 5\' 10"  (1.778 m)   Wt 179 lb 12.8 oz (81.6 kg)   BMI 25.80 kg/m   Body mass index is 25.8 kg/m.  Tobacco History  Smoking Status  . Former Smoker  . Years: 9.00  . Types: Cigarettes  . Quit date: 08/25/1963  Smokeless Tobacco  . Never Used     Counseling given: Not Answered   Past Medical History:  Diagnosis Date  . CAD (coronary artery disease)   . Cancer (HCC)    LYMPHOMA/ TUMOR LEFT SINUS  . Cataract   . GERD (gastroesophageal reflux disease)   . Heart murmur   . History of nuclear stress test 06/2012   normal pattern of perfusion; low risk scan  . Hyperlipidemia   . Hypertension   . LVH (left ventricular hypertrophy)    mild, concentric  . Mild aortic sclerosis ([place])   . Myocardial infarction 08/1996   non-Q-wave inferolateral   . Peyronie's disease   . Prostate cancer [facility])    Past Surgical History:  Procedure Laterality Date  . CARDIAC CATHETERIZATION  1992   PTCA to LAD, in [place]  . CATARACT EXTRACTION  08/06/2010   right eye  . CORONARY ANGIOPLASTY WITH STENT PLACEMENT  1998   L circumflex - 3.0x23.9x9 bare metal stent  . EYE SURGERY  2011   Cataracts removed.  Marland Kitchen HEMORROIDECTOMY    . HERNIA REPAIR  2004  . PROSTATE SURGERY  03/2009   seed implant due to prostate cancer  . TONSILLECTOMY  childhood  . TRANSTHORACIC ECHOCARDIOGRAM  09/01/2011   EF=>55%; mild conc LVH; borderline RV enlargement; LA mildly dilated; mild mitral annular calcif; mild-mod MR; RV systolic pressure elevated; AV mildly sclerotic; mild AV regurg; aortic root sclerosis/calcif   Family History  Problem Relation Age of Onset    . Emphysema Father   . COPD Father   . Stroke Mother   . Hypertension Brother   . Hyperlipidemia Brother   . Heart disease Brother     CAD   History  Sexual Activity  . Sexual activity: No    Outpatient Encounter Prescriptions as of 09/17/2016  Medication Sig  . aspirin 81 MG tablet Take by mouth.  Marland Kitchen atorvastatin (LIPITOR) 40 MG tablet Take 1 tablet (40 mg total) by mouth daily.  . B COMPLEX VITAMINS PO Take by mouth.   . cyclobenzaprine (FLEXERIL) 10 MG tablet Take 1 tablet (10 mg total) by mouth daily as needed for muscle spasms.  . DPH-Lido-AlHydr-MgHydr-Simeth (FIRST-MOUTHWASH BLM) SUSP   . ezetimibe (ZETIA) 10 MG tablet Take 1 tablet (10 mg total) by mouth daily.  Marland Kitchen FLUZONE HIGH-DOSE 0.5 ML SUSY ADM 0.5ML IM UTD  . metoprolol succinate (TOPROL-XL) 50 MG 24 hr tablet Take one (1) tablet (50 mg total) by mouth daily.  . naproxen sodium (ANAPROX) 220 MG tablet Take by mouth.   Marland Kitchen oxycodone (OXY-IR) 5 MG capsule Take 5 mg by mouth.   . Potassium 99 MG TABS Take 1 tablet by mouth daily.   . ramipril (ALTACE) 10 MG capsule Take 1 capsule (10 mg total) by mouth daily.  . ranitidine (ZANTAC)  150 MG capsule Take by mouth.   . COD LIVER OIL PO Take 4 capsules by mouth daily.   . Coenzyme Q10 (CO Q10) 200 MG CAPS Take by mouth.  Marland Kitchen HYDROcodone-acetaminophen (NORCO/VICODIN) 5-325 MG per tablet Take by mouth.  Vanessa Kick Ethyl 1 g CAPS Take 2 capsules by mouth 2 (two) times daily. (Patient not taking: Reported on 09/17/2016)  . Omega-3 1000 MG CAPS Take 1,000 mg by mouth.    No facility-administered encounter medications on file as of 09/17/2016.     Activities of Daily Living In your present state of health, do you have any difficulty performing the following activities: 09/17/2016  Hearing? N  Vision? Y  Difficulty concentrating or making decisions? N  Walking or climbing stairs? N  Dressing or bathing? N  Doing errands, shopping? N  Preparing Food and eating ? N  Using the  Toilet? N  In the past six months, have you accidently leaked urine? N  Do you have problems with loss of bowel control? N  Managing your Medications? N  Managing your Finances? N  Housekeeping or managing your Housekeeping? N  Some recent data might be hidden    Patient Care Team: Jerrol Banana., MD as PCP - General (Family Medicine) Estill Cotta, MD as Consulting Physician (Ophthalmology) Myrlene Broker, MD as Attending Physician (Urology) Troy Sine, MD as Consulting Physician (Cardiology) Corey Harold, MD as Referring Physician (Internal Medicine) Dorthy Cooler, MD as Referring Physician (Radiation Oncology)   Assessment:     Exercise Activities and Dietary recommendations Current Exercise Habits: Home exercise routine, Type of exercise: walking, Time (Minutes): 35, Frequency (Times/Week): 2, Weekly Exercise (Minutes/Week): 70, Intensity: Mild  Goals    . Increase water intake          Starting 09/17/16, I will increase my water intake to 4-5 glasses a day.      Fall Risk Fall Risk  09/17/2016 10/09/2015 04/01/2015  Falls in the past year? Yes No No  Number falls in past yr: 1 - -  Injury with Fall? No - -  Follow up Falls prevention discussed - -   Depression Screen PHQ 2/9 Scores 09/17/2016 10/09/2015 04/01/2015  PHQ - 2 Score 0 0 0    Cognitive Function     6CIT Screen 09/17/2016  What Year? 0 points  What month? 0 points  What time? 0 points  Count back from 20 0 points  Months in reverse 0 points  Repeat phrase 2 points  Total Score 2    Immunization History  Administered Date(s) Administered  . Influenza-Unspecified 05/24/2013, 05/30/2015  . Pneumococcal Conjugate-13 09/20/2014  . Pneumococcal Polysaccharide-23 07/01/2011  . Tdap 12/27/2014  . Zoster 09/27/2012   Screening Tests Health Maintenance  Topic Date Due  . TETANUS/TDAP  12/26/2024  . INFLUENZA VACCINE  Completed  . ZOSTAVAX  Completed  . PNA vac Low Risk Adult  Completed       Plan:  I have personally reviewed and addressed the Medicare Annual Wellness questionnaire and have noted the following in the patient's chart:  A. Medical and social history B. Use of alcohol, tobacco or illicit drugs  C. Current medications and supplements D. Functional ability and status E.  Nutritional status F.  Physical activity G. Advance directives H. List of other physicians I.  Hospitalizations, surgeries, and ER visits in previous 12 months J.  [place] such as hearing and vision if needed, cognitive and depression L.  Referrals and appointments - none  In addition, I have reviewed and discussed with patient certain preventive protocols, quality metrics, and best practice recommendations. A written personalized care plan for preventive services as well as general preventive health recommendations were provided to patient.  See attached scanned questionnaire for additional information.   Signed,  Fabio Neighbors, LPN Nurse Health Advisor   MD Recommendations: None I have reviewed the health advisors note, was  available for consultation and I agree with documentation and plan. Miguel Aschoff MD [facility]

## 2016-09-17 NOTE — Patient Instructions (Signed)

## 2016-09-22 NOTE — H&P (Signed)
See scanned note.

## 2016-09-23 ENCOUNTER — Encounter
Admission: RE | Disposition: A | Discharge status: Home / Self Care | Payer: Self-pay | Source: Ambulatory Visit | Attending: Ophthalmology

## 2016-09-23 ENCOUNTER — Ambulatory Visit: Payer: Medicare Other | Admitting: Anesthesiology

## 2016-09-23 ENCOUNTER — Ambulatory Visit
Admission: RE | Admit: 2016-09-23 | Discharge: 2016-09-23 | Disposition: A | Discharge status: Home / Self Care | Payer: Medicare Other | Source: Ambulatory Visit | Attending: Ophthalmology | Admitting: Ophthalmology

## 2016-09-23 DIAGNOSIS — I252 Old myocardial infarction: Secondary | ICD-10-CM | POA: Insufficient documentation

## 2016-09-23 DIAGNOSIS — K219 Gastro-esophageal reflux disease without esophagitis: Secondary | ICD-10-CM | POA: Diagnosis not present

## 2016-09-23 DIAGNOSIS — I10 Essential (primary) hypertension: Secondary | ICD-10-CM | POA: Diagnosis not present

## 2016-09-23 DIAGNOSIS — Z87891 Personal history of nicotine dependence: Secondary | ICD-10-CM | POA: Insufficient documentation

## 2016-09-23 DIAGNOSIS — Z79899 Other long term (current) drug therapy: Secondary | ICD-10-CM | POA: Diagnosis not present

## 2016-09-23 DIAGNOSIS — H2512 Age-related nuclear cataract, left eye: Secondary | ICD-10-CM | POA: Diagnosis not present

## 2016-09-23 DIAGNOSIS — E1136 Type 2 diabetes mellitus with diabetic cataract: Secondary | ICD-10-CM | POA: Diagnosis not present

## 2016-09-23 DIAGNOSIS — I2510 Atherosclerotic heart disease of native coronary artery without angina pectoris: Secondary | ICD-10-CM | POA: Insufficient documentation

## 2016-09-23 HISTORY — DX: Cardiac murmur, unspecified: R01.1

## 2016-09-23 HISTORY — DX: Malignant (primary) neoplasm, unspecified: C80.1

## 2016-09-23 HISTORY — PX: CATARACT EXTRACTION W/PHACO: SHX586

## 2016-09-23 HISTORY — DX: Gastro-esophageal reflux disease without esophagitis: K21.9

## 2016-09-23 SURGERY — PHACOEMULSIFICATION, CATARACT, WITH IOL INSERTION
Anesthesia: Monitor Anesthesia Care | Site: Eye | Laterality: Left | Wound class: Clean

## 2016-09-23 MED ORDER — NA CHONDROIT SULF-NA HYALURON 40-17 MG/ML IO SOLN
INTRAOCULAR | Status: AC
  Filled 2016-09-23: qty 1

## 2016-09-23 MED ORDER — PHENYLEPHRINE HCL 10 % OP SOLN
OPHTHALMIC | Status: AC
  Filled 2016-09-23: qty 5

## 2016-09-23 MED ORDER — TETRACAINE HCL 0.5 % OP SOLN
OPHTHALMIC | Status: DC | PRN
  Administered 2016-09-23: 1 [drp] via OPHTHALMIC

## 2016-09-23 MED ORDER — CEFUROXIME OPHTHALMIC INJECTION 1 MG/0.1 ML
INJECTION | OPHTHALMIC | Status: AC
  Filled 2016-09-23: qty 0.1

## 2016-09-23 MED ORDER — MOXIFLOXACIN HCL 0.5 % OP SOLN
OPHTHALMIC | Status: AC
  Administered 2016-09-23: 1 [drp] via OPHTHALMIC
  Filled 2016-09-23: qty 3

## 2016-09-23 MED ORDER — MIDAZOLAM HCL 2 MG/2ML IJ SOLN
INTRAMUSCULAR | Status: DC | PRN
  Administered 2016-09-23: 0.5 mg via INTRAVENOUS

## 2016-09-23 MED ORDER — MIDAZOLAM HCL 2 MG/2ML IJ SOLN
INTRAMUSCULAR | Status: AC
  Filled 2016-09-23: qty 2

## 2016-09-23 MED ORDER — CYCLOPENTOLATE HCL 2 % OP SOLN: 1.0000 [drp] | OPHTHALMIC | Status: AC

## 2016-09-23 MED ORDER — ALFENTANIL 500 MCG/ML IJ INJ
INJECTION | INTRAMUSCULAR | Status: DC | PRN
  Administered 2016-09-23: 500 ug via INTRAVENOUS

## 2016-09-23 MED ORDER — CYCLOPENTOLATE HCL 2 % OP SOLN
OPHTHALMIC | Status: AC
  Administered 2016-09-23: 1 [drp] via OPHTHALMIC
  Filled 2016-09-23: qty 2

## 2016-09-23 MED ORDER — MOXIFLOXACIN HCL 0.5 % OP SOLN
1.0000 [drp] | OPHTHALMIC | Status: AC
  Administered 2016-09-23 (×3): 1 [drp] via OPHTHALMIC

## 2016-09-23 MED ORDER — POVIDONE-IODINE 5 % OP SOLN
OPHTHALMIC | Status: DC | PRN
  Administered 2016-09-23: 1 via OPHTHALMIC

## 2016-09-23 MED ORDER — BUPIVACAINE HCL (PF) 0.75 % IJ SOLN
INTRAMUSCULAR | Status: AC
  Filled 2016-09-23: qty 10

## 2016-09-23 MED ORDER — POVIDONE-IODINE 5 % OP SOLN
OPHTHALMIC | Status: AC
  Filled 2016-09-23: qty 30

## 2016-09-23 MED ORDER — LIDOCAINE HCL (PF) 4 % IJ SOLN
INTRAMUSCULAR | Status: DC | PRN
  Administered 2016-09-23: 9 mL via OPHTHALMIC

## 2016-09-23 MED ORDER — LIDOCAINE HCL (PF) 4 % IJ SOLN
INTRAMUSCULAR | Status: AC
  Filled 2016-09-23: qty 5

## 2016-09-23 MED ORDER — EPINEPHRINE PF 1 MG/ML IJ SOLN
INTRAMUSCULAR | Status: AC
  Filled 2016-09-23: qty 2

## 2016-09-23 MED ORDER — CYCLOPENTOLATE HCL 2 % OP SOLN
1.0000 [drp] | OPHTHALMIC | Status: AC
  Administered 2016-09-23 (×4): 1 [drp] via OPHTHALMIC

## 2016-09-23 MED ORDER — MOXIFLOXACIN HCL 0.5 % OP SOLN
OPHTHALMIC | Status: DC | PRN
  Administered 2016-09-23: 1 [drp] via OPHTHALMIC

## 2016-09-23 MED ORDER — HYALURONIDASE HUMAN 150 UNIT/ML IJ SOLN
INTRAMUSCULAR | Status: AC
  Filled 2016-09-23: qty 1

## 2016-09-23 MED ORDER — MOXIFLOXACIN HCL 0.5 % OP SOLN: 1.0000 [drp] | OPHTHALMIC | Status: AC

## 2016-09-23 MED ORDER — PHENYLEPHRINE HCL 10 % OP SOLN
1.0000 [drp] | OPHTHALMIC | Status: AC
  Administered 2016-09-23 (×4): 1 [drp] via OPHTHALMIC

## 2016-09-23 MED ORDER — BSS IO SOLN
INTRAOCULAR | Status: DC | PRN
  Administered 2016-09-23: 1 mL via OPHTHALMIC

## 2016-09-23 MED ORDER — CEFUROXIME OPHTHALMIC INJECTION 1 MG/0.1 ML
INJECTION | OPHTHALMIC | Status: DC | PRN
Start: 2016-09-23 — End: 2016-09-23
  Administered 2016-09-23: 0.1 mL via INTRACAMERAL

## 2016-09-23 MED ORDER — TETRACAINE HCL 0.5 % OP SOLN
OPHTHALMIC | Status: AC
  Filled 2016-09-23: qty 2

## 2016-09-23 MED ORDER — CARBACHOL 0.01 % IO SOLN
INTRAOCULAR | Status: DC | PRN
  Administered 2016-09-23: 0.5 mL via INTRAOCULAR

## 2016-09-23 MED ORDER — SODIUM CHLORIDE 0.9 % IV SOLN
INTRAVENOUS | Status: DC
  Administered 2016-09-23 (×2): via INTRAVENOUS

## 2016-09-23 SURGICAL SUPPLY — 31 items
CANNULA ANT/CHMB 27G (MISCELLANEOUS) ×1 IMPLANT
CANNULA ANT/CHMB 27GA (MISCELLANEOUS) ×6 IMPLANT
CORD BIP STRL DISP 12FT (MISCELLANEOUS) ×3 IMPLANT
CUP MEDICINE 2OZ PLAST GRAD ST (MISCELLANEOUS) ×3 IMPLANT
DRAPE XRAY CASSETTE 23X24 (DRAPES) ×3 IMPLANT
ERASER HMR WETFIELD 18G (MISCELLANEOUS) ×3 IMPLANT
GLOVE BIO SURGEON STRL SZ8 (GLOVE) ×3 IMPLANT
GLOVE SURG LX 6.5 MICRO (GLOVE) ×2
GLOVE SURG LX 8.0 MICRO (GLOVE) ×2
GLOVE SURG LX STRL 6.5 MICRO (GLOVE) ×1 IMPLANT
GLOVE SURG LX STRL 8.0 MICRO (GLOVE) ×1 IMPLANT
GOWN STRL REUS W/ TWL LRG LVL3 (GOWN DISPOSABLE) ×1 IMPLANT
GOWN STRL REUS W/ TWL XL LVL3 (GOWN DISPOSABLE) ×1 IMPLANT
GOWN STRL REUS W/TWL LRG LVL3 (GOWN DISPOSABLE) ×3
GOWN STRL REUS W/TWL XL LVL3 (GOWN DISPOSABLE) ×3
LENS IOL ACRSF IQ ULTRA 13.5 (Intraocular Lens) IMPLANT
LENS IOL ACRYSOF IQ 13.5 (Intraocular Lens) ×3 IMPLANT
PACK CATARACT (MISCELLANEOUS) ×3 IMPLANT
PACK CATARACT DINGLEDEIN LX (MISCELLANEOUS) ×3 IMPLANT
PACK EYE AFTER SURG (MISCELLANEOUS) ×3 IMPLANT
SHLD EYE VISITEC  UNIV (MISCELLANEOUS) ×3 IMPLANT
SOL BSS BAG (MISCELLANEOUS) ×3
SOL PREP PVP 2OZ (MISCELLANEOUS) ×3
SOLUTION BSS BAG (MISCELLANEOUS) ×1 IMPLANT
SOLUTION PREP PVP 2OZ (MISCELLANEOUS) ×1 IMPLANT
SUT SILK 5-0 (SUTURE) ×3 IMPLANT
SYR 3ML LL SCALE MARK (SYRINGE) ×3 IMPLANT
SYR 5ML LL (SYRINGE) ×3 IMPLANT
SYR TB 1ML 27GX1/2 LL (SYRINGE) ×3 IMPLANT
WATER STERILE IRR 250ML POUR (IV SOLUTION) ×3 IMPLANT
WIPE NON LINTING 3.25X3.25 (MISCELLANEOUS) ×3 IMPLANT

## 2016-09-23 NOTE — Transfer of Care (Signed)
Immediate Anesthesia Transfer of Care Note  Patient: NAZIR HACKER  Procedure(s) Performed: Procedure(s) with comments: CATARACT EXTRACTION PHACO AND INTRAOCULAR LENS PLACEMENT (IOC) (Left) - [place] 1:38.5 AP% 25.3 CDE 48.16 Fluid pack lot # [id number]  Patient Location: PACU and Short Stay  Anesthesia Type:MAC  Level of Consciousness: awake, oriented and patient cooperative  Airway & Oxygen Therapy: Patient Spontanous Breathing  Post-op Assessment: Report given to RN and Post -op Vital signs reviewed and stable  Post vital signs: Reviewed and stable  Last Vitals:  Vitals:   09/23/16 0652  BP: (!) 161/87  Pulse: 60  Resp: 16    Last Pain:  Vitals:   09/23/16 0652  PainSc: 0-No pain         Complications: No apparent anesthesia complications

## 2016-09-23 NOTE — Op Note (Signed)
Date of Surgery: 09/23/2016 Date of Dictation: 09/23/2016 9:07 AM Pre-operative Diagnosis:  Nuclear Sclerotic Cataract left Eye Post-operative Diagnosis: same Procedure performed: Extra-capsular Cataract Extraction (ECCE) with placement of a posterior chamber intraocular lens (IOL) left Eye IOL:  Implant Name Type Inv. Item Serial No. Manufacturer Lot No. LRB No. Used  LENS IOL ACRYSOF IQ 13.5 - [id number] Intraocular Lens LENS IOL ACRYSOF IQ 13.5 [id number] ALCON   Left 1   Anesthesia: 2% Lidocaine and 4% Marcaine in a 50/50 mixture with 10 unites/ml of Hylenex given as a peribulbar Anesthesiologist: Anesthesiologist: Molli Barrows, MD CRNA: Courtney Paris, CRNA; Lance Muss, CRNA Complications: none Estimated Blood Loss: less than 1 ml  Description of procedure:  The patient was given anesthesia and sedation via intravenous access. The patient was then prepped and draped in the usual fashion. A 25-gauge needle was bent for initiating the capsulorhexis. A 5-0 silk suture was placed through the conjunctiva superior and inferiorly to serve as bridle sutures. Hemostasis was obtained at the superior limbus using an eraser cautery. A partial thickness groove was made at the anterior surgical limbus with a 64 Beaver blade and this was dissected anteriorly with an Avaya. The anterior chamber was entered at 10 o'clock with a 1.0 mm paracentesis knife and through the lamellar dissection with a 2.6 mm Alcon keratome. Epi-Shugarcaine 0.5 CC [9 cc BSS Plus (Alcon), 3 cc 4% preservative-free lidocaine (Hospira) and 4 cc 1:1000 preservative-free, bisulfite-free epinephrine] was injected into the anterior chamber via the paracentesis tract. Epi-Shugarcaine 0.5 CC [9 cc BSS Plus (Alcon), 3 cc 4% preservative-free lidocaine (Hospira) and 4 cc 1:1000 preservative-free, bisulfite-free epinephrine] was injected into the anterior chamber via the paracentesis tract. DiscoVisc was injected to replace the  aqueous and a continuous tear curvilinear capsulorhexis was performed using a bent 25-gauge needle.  Balance salt on a syringe was used to perform hydro-dissection and phacoemulsification was carried out using a divide and conquer technique. Procedure(s) with comments: CATARACT EXTRACTION PHACO AND INTRAOCULAR LENS PLACEMENT (IOC) (Left) - [place] 1:38.5 AP% 25.3 CDE 48.16 Fluid pack lot # [id number]. Irrigation/aspiration was used to remove the residual cortex and the capsular bag was inflated with DiscoVisc. The intraocular lens was inserted into the capsular bag using a pre-loaded UltraSert Delivery System. Irrigation/aspiration was used to remove the residual DiscoVisc. The wound was inflated with balanced salt and checked for leaks. None were found. Miostat was injected via the paracentesis track and 0.1 ml of cefuroxime containing 1 mg of drug  was injected via the paracentesis track. The wound was checked for leaks again and none were found.   The bridal sutures were removed and two drops of Vigamox were placed on the eye. An eye shield was placed to protect the eye and the patient was discharged to the recovery area in good condition.   Macalister Arnaud MD

## 2016-09-23 NOTE — Anesthesia Post-op Follow-up Note (Cosign Needed)
Anesthesia QCDR form completed.        

## 2016-09-23 NOTE — Interval H&P Note (Signed)
History and Physical Interval Note:  09/23/2016 7:32 AM  Elijah Peters  has presented today for surgery, with the diagnosis of Nuclear Sclerotic Cataract Left Eye  The various methods of treatment have been discussed with the patient and family. After consideration of risks, benefits and other options for treatment, the patient has consented to  Procedure(s) with comments: CATARACT EXTRACTION PHACO AND INTRAOCULAR LENS PLACEMENT (IOC) (Left) - [place] AP% CDE Fluid pack lot # [id number] as a surgical intervention .  The patient's history has been reviewed, patient examined, no change in status, stable for surgery.  I have reviewed the patient's chart and labs.  Questions were answered to the patient's satisfaction.     Nil Xiong

## 2016-09-23 NOTE — Anesthesia Preprocedure Evaluation (Signed)
Anesthesia Evaluation  Patient identified by MRN, date of birth, ID band Patient awake    Reviewed: Allergy & Precautions, H&P , NPO status , Patient's Chart, lab work & pertinent test results, reviewed documented beta blocker date and time   Airway Mallampati: II  TM Distance: >3 FB Neck ROM: full    Dental no notable dental hx. (+) Teeth Intact   Pulmonary neg pulmonary ROS, former smoker,    Pulmonary exam normal breath sounds clear to auscultation       Cardiovascular Exercise Tolerance: Good hypertension, + CAD and + Past MI  negative cardio ROS  + Valvular Problems/Murmurs AS  Rhythm:regular Rate:Normal     Neuro/Psych negative neurological ROS  negative psych ROS   GI/Hepatic negative GI ROS, Neg liver ROS, GERD  Medicated,  Endo/Other  negative endocrine ROSdiabetes  Renal/GU      Musculoskeletal   Abdominal   Peds  Hematology negative hematology ROS (+)   Anesthesia Other Findings   Reproductive/Obstetrics negative OB ROS                             Anesthesia Physical Anesthesia Plan  ASA: III  Anesthesia Plan: MAC   Post-op Pain Management:    Induction:   Airway Management Planned:   Additional Equipment:   Intra-op Plan:   Post-operative Plan:   Informed Consent: I have reviewed the patients History and Physical, chart, labs and discussed the procedure including the risks, benefits and alternatives for the proposed anesthesia with the patient or authorized representative who has indicated his/her understanding and acceptance.     Plan Discussed with: CRNA  Anesthesia Plan Comments:         Anesthesia Quick Evaluation

## 2016-09-23 NOTE — Discharge Instructions (Signed)
Eye Surgery Discharge Instructions  Expect mild scratchy sensation or mild soreness. DO NOT RUB YOUR EYE!  The day of surgery:  Minimal physical activity, but bed rest is not required  No reading, computer work, or close hand work  No bending, lifting, or straining.  May watch TV  For 24 hours:  No driving, legal decisions, or alcoholic beverages  Safety precautions  Eat anything you prefer: It is better to start with liquids, then soup then solid foods.  _____ Eye patch should be worn until postoperative exam tomorrow.  ____ Solar shield eyeglasses should be worn for comfort in the sunlight/patch while sleeping  Resume all regular medications including aspirin or Coumadin if these were discontinued prior to surgery. You may shower, bathe, shave, or wash your hair. Tylenol may be taken for mild discomfort.  Call your doctor if you experience significant pain, nausea, or vomiting, fever > 101 or other signs of infection. [phone] or [phone] Specific instructions:  Follow-up Information    Cristol Engdahl, MD Follow up.   Specialty:  Ophthalmology Why:    February 1 at 10:45am Contact information: [address]   [place] [address] [phone]

## 2016-09-24 NOTE — Anesthesia Postprocedure Evaluation (Signed)
Anesthesia Post Note  Patient: Elijah Peters  Procedure(s) Performed: Procedure(s) (LRB): CATARACT EXTRACTION PHACO AND INTRAOCULAR LENS PLACEMENT (IOC) (Left)  Patient location during evaluation: PACU Anesthesia Type: MAC Level of consciousness: awake and alert Pain management: pain level controlled Vital Signs Assessment: post-procedure vital signs reviewed and stable Respiratory status: spontaneous breathing, nonlabored ventilation, respiratory function stable and patient connected to nasal cannula oxygen Cardiovascular status: blood pressure returned to baseline and stable Postop Assessment: no signs of nausea or vomiting Anesthetic complications: no     Last Vitals:  Vitals:   09/23/16 0911 09/23/16 0915  BP: (!) 152/69 (!) 155/64  Pulse: (!) 51   Resp: 16   Temp: 36.7 C     Last Pain:  Vitals:   09/23/16 0908  PainSc: 0-No pain                 Molli Barrows

## 2016-10-08 ENCOUNTER — Ambulatory Visit: Payer: Medicare Other | Admitting: Family Medicine

## 2016-10-08 DIAGNOSIS — R200 Anesthesia of skin: Secondary | ICD-10-CM | POA: Diagnosis not present

## 2016-10-08 DIAGNOSIS — C8339 Diffuse large B-cell lymphoma, extranodal and solid organ sites: Secondary | ICD-10-CM | POA: Diagnosis not present

## 2016-10-08 DIAGNOSIS — Z9221 Personal history of antineoplastic chemotherapy: Secondary | ICD-10-CM | POA: Diagnosis not present

## 2016-10-08 DIAGNOSIS — Z08 Encounter for follow-up examination after completed treatment for malignant neoplasm: Secondary | ICD-10-CM | POA: Diagnosis not present

## 2016-10-08 DIAGNOSIS — Z923 Personal history of irradiation: Secondary | ICD-10-CM | POA: Diagnosis not present

## 2016-10-08 DIAGNOSIS — Z888 Allergy status to other drugs, medicaments and biological substances status: Secondary | ICD-10-CM | POA: Diagnosis not present

## 2016-10-13 ENCOUNTER — Ambulatory Visit (INDEPENDENT_AMBULATORY_CARE_PROVIDER_SITE_OTHER): Payer: Medicare Other | Admitting: Family Medicine

## 2016-10-13 ENCOUNTER — Encounter: Payer: Self-pay | Admitting: Family Medicine

## 2016-10-13 VITALS — BP 142/80 | HR 74 | Temp 97.6°F | Resp 16 | Wt 175.0 lb

## 2016-10-13 DIAGNOSIS — I2510 Atherosclerotic heart disease of native coronary artery without angina pectoris: Secondary | ICD-10-CM | POA: Diagnosis not present

## 2016-10-13 DIAGNOSIS — I10 Essential (primary) hypertension: Secondary | ICD-10-CM | POA: Diagnosis not present

## 2016-10-13 DIAGNOSIS — C8331 Diffuse large B-cell lymphoma, lymph nodes of head, face, and neck: Secondary | ICD-10-CM

## 2016-10-13 NOTE — Progress Notes (Signed)
Subjective:  HPI  Hypertension, follow-up:  BP Readings from Last 3 Encounters:  10/13/16 (!) 142/80  09/23/16 (!) 155/64  09/17/16 130/68    He was last seen for hypertension 6 months ago.  BP at that visit was 155/64. Management since that visit includes none. He reports good compliance with treatment. He is not having side effects.  He is exercising. He is adherent to low salt diet.   Outside blood pressures are running 140's/80's at other doctors appointments. He is experiencing none.  Patient denies chest pain, chest pressure/discomfort, claudication, dyspnea, exertional chest pressure/discomfort, fatigue, irregular heart beat, lower extremity edema, near-syncope, orthopnea, palpitations, paroxysmal nocturnal dyspnea and syncope.   Cardiovascular risk factors include advanced age (older than 58 for men, 63 for women), dyslipidemia, hypertension, male gender and smoking/ tobacco exposure.   Wt Readings from Last 3 Encounters:  10/13/16 175 lb (79.4 kg)  09/23/16 178 lb (80.7 kg)  09/17/16 179 lb 12.8 oz (81.6 kg)    ------------------------------------------------------------------------  Pt reports that he is feeling about 90% back to his normal since his cancer treatments etc. He does have a lesion on his right arm that he would like looked at.   Prior to Admission medications   Medication Sig Start Date End Date Taking? Authorizing Provider  aspirin 81 MG tablet Take 81 mg by mouth daily.  07/01/11  Yes Historical Provider, MD  atorvastatin (LIPITOR) 40 MG tablet Take 1 tablet (40 mg total) by mouth daily. Patient taking differently: Take 40 mg by mouth at bedtime.  08/14/16  Yes Troy Sine, MD  B COMPLEX VITAMINS PO Take 1 tablet by mouth daily as needed (energy).    Yes Historical Provider, MD  calcium elemental as carbonate (TUMS ULTRA 1000) 400 MG chewable tablet Chew 1,000 mg by mouth daily as needed for heartburn.   Yes Historical Provider, MD  COD LIVER  OIL PO Take 4 capsules by mouth daily.  01/11/13  Yes Historical Provider, MD  cyclobenzaprine (FLEXERIL) 10 MG tablet Take 1 tablet (10 mg total) by mouth daily as needed for muscle spasms. 04/01/15  Yes Keane Martelli Maceo Pro., MD  ezetimibe (ZETIA) 10 MG tablet Take 1 tablet (10 mg total) by mouth daily. Patient taking differently: Take 10 mg by mouth at bedtime.  08/14/16  Yes Troy Sine, MD  metoprolol succinate (TOPROL-XL) 50 MG 24 hr tablet Take one (1) tablet (50 mg total) by mouth daily. 08/14/16  Yes Troy Sine, MD  ranitidine (ZANTAC) 150 MG capsule Take 150 mg by mouth 2 (two) times daily as needed for heartburn.  11/29/13  Yes Historical Provider, MD  HYDROcodone-acetaminophen (NORCO/VICODIN) 5-325 MG per tablet Take 1 tablet by mouth daily as needed for moderate pain.  04/28/12   Historical Provider, MD  Icosapent Ethyl 1 g CAPS Take 2 capsules by mouth 2 (two) times daily. Patient not taking: Reported on 09/23/2016 11/05/15   Troy Sine, MD  naproxen sodium (ANAPROX) 220 MG tablet Take 220 mg by mouth daily as needed (pain).  09/20/14   Historical Provider, MD  OVER THE COUNTER MEDICATION Saline and baking soda mouth rinse, swish and spit 4 to 5 times daily    Historical Provider, MD  Potassium 99 MG TABS Take 1 tablet by mouth daily as needed (cramps).     Historical Provider, MD  ramipril (ALTACE) 10 MG capsule Take 1 capsule (10 mg total) by mouth daily. Patient not taking: Reported on 10/13/2016 08/14/16  Troy Sine, MD  sodium chloride (OCEAN) 0.65 % SOLN nasal spray Place 1 spray into both nostrils as needed for congestion.    Historical Provider, MD    Patient Active Problem List   Diagnosis Date Noted  . CAD in native artery 03/01/2015  . Benign enlargement of prostate 03/01/2015  . Narrowing of intervertebral disc space 03/01/2015  . Contracture of palmar fascia (Dupuytren's) 03/01/2015  . Essential (primary) hypertension 03/01/2015  . Acid reflux 03/01/2015  .  H/O acute myocardial infarction 03/01/2015  . CA of prostate ([place]) 03/01/2015  . Hypercholesterolemia without hypertriglyceridemia 03/01/2015  . Prostate CA ([place]) 05/24/2014  . CAD (coronary artery disease) 04/29/2013  . HTN (hypertension) 04/29/2013  . Hyperlipidemia with target LDL less than 70 04/29/2013  . Aortic valve sclerosis 04/29/2013    Past Medical History:  Diagnosis Date  . CAD (coronary artery disease)   . Cancer (HCC)    LYMPHOMA/ TUMOR LEFT SINUS  . Cataract   . GERD (gastroesophageal reflux disease)   . Heart murmur   . History of nuclear stress test 06/2012   normal pattern of perfusion; low risk scan  . Hyperlipidemia   . Hypertension   . LVH (left ventricular hypertrophy)    mild, concentric  . Mild aortic sclerosis ([place])   . Myocardial infarction 08/1996   non-Q-wave inferolateral   . Peyronie's disease   . Prostate cancer [facility])     Social History   Social History  . Marital status: Married    Spouse name: N/A  . Number of children: 4  . Years of education: N/A   Occupational History  . retired [place] Topics  . Smoking status: Former Smoker    Years: 9.00    Types: Cigarettes    Quit date: 08/25/1963  . Smokeless tobacco: Never Used  . Alcohol use 6.0 oz/week    7 Standard drinks or equivalent, 3 Glasses of wine per week  . Drug use: No  . Sexual activity: No   Other Topics Concern  . Not on file   Social History Narrative  . No narrative on file    Allergies  Allergen Reactions  . Niacin And Related Itching    Review of Systems  Constitutional: Negative.   HENT: Negative.   Eyes: Negative.   Respiratory: Negative.   Cardiovascular: Negative.   Gastrointestinal: Negative.   Genitourinary: Negative.   Musculoskeletal: Negative.   Skin: Negative.        Right arm lesion.  Neurological: Negative.   Endo/Heme/Allergies: Negative.   Psychiatric/Behavioral: Negative.     Immunization History    Administered Date(s) Administered  . Influenza-Unspecified 05/24/2013, 05/30/2015  . Pneumococcal Conjugate-13 09/20/2014  . Pneumococcal Polysaccharide-23 07/01/2011  . Tdap 12/27/2014  . Zoster 09/27/2012    Objective:  BP (!) 142/80 (BP Location: Left Arm, Patient Position: Sitting, Cuff Size: Normal)   Pulse 74   Temp 97.6 F (36.4 C) (Oral)   Resp 16   Wt 175 lb (79.4 kg)   BMI 25.11 kg/m   Physical Exam  Constitutional: He is oriented to person, place, and time and well-developed, well-nourished, and in no distress.  HENT:  Head: Normocephalic and atraumatic.  Right Ear: External ear normal.  Left Ear: External ear normal.  Nose: Nose normal.  Eyes: Conjunctivae are normal.  Neck: No thyromegaly present.  Cardiovascular: Normal rate and normal heart sounds.   Pulmonary/Chest: Effort normal and breath sounds normal.  Abdominal: Soft.  Lymphadenopathy:    He has no cervical adenopathy.  Neurological: He is alert and oriented to person, place, and time. Gait normal.  Skin: Skin is warm and dry.  Irritated SK on right upper arm.  Psychiatric: Mood, memory, affect and judgment normal.    Lab Results  Component Value Date   WBC 5.6 03/10/2016   HGB 14.7 10/10/2014   HCT 42.6 03/10/2016   PLT 184 03/10/2016   GLUCOSE 97 03/10/2016   CHOL 102 03/10/2016   TRIG 113 03/10/2016   HDL 29 (L) 03/10/2016   LDLCALC 50 03/10/2016   TSH 1.590 03/10/2016   INR 1.0 03/26/2009    CMP     Component Value Date/Time   NA 138 03/10/2016 1237   K 4.5 03/10/2016 1237   CL 99 03/10/2016 1237   CO2 24 03/10/2016 1237   GLUCOSE 97 03/10/2016 1237   GLUCOSE 91 03/26/2009 1120   BUN 17 03/10/2016 1237   CREATININE 0.88 03/10/2016 1237   CALCIUM 8.9 03/10/2016 1237   PROT 6.5 03/10/2016 1237   ALBUMIN 4.1 03/10/2016 1237   AST 20 03/10/2016 1237   ALT 18 03/10/2016 1237   ALKPHOS 92 03/10/2016 1237   BILITOT 1.1 03/10/2016 1237   GFRNONAA 83 03/10/2016 1237   GFRAA 96  03/10/2016 1237    Assessment and Plan :  CAD All risk factors treated. HTN HLD SK Frozen with Cryopen. BCell Lymphoma Therapy finished.  I have done the exam and reviewed the above chart and it is accurate to the best of my knowledge. Development worker, community has been used in this note in any air is in the dictation or transcription are unintentional.  [facility] 10/13/2016 11:36 AM

## 2016-10-14 ENCOUNTER — Encounter: Payer: Self-pay | Admitting: Family Medicine

## 2016-10-15 ENCOUNTER — Ambulatory Visit (INDEPENDENT_AMBULATORY_CARE_PROVIDER_SITE_OTHER): Payer: Medicare Other | Admitting: Cardiovascular Disease

## 2016-10-15 ENCOUNTER — Encounter: Payer: Self-pay | Admitting: Cardiovascular Disease

## 2016-10-15 VITALS — BP 134/76 | HR 69 | Ht 71.0 in | Wt 173.8 lb

## 2016-10-15 DIAGNOSIS — C310 Malignant neoplasm of maxillary sinus: Secondary | ICD-10-CM | POA: Diagnosis not present

## 2016-10-15 DIAGNOSIS — Z79899 Other long term (current) drug therapy: Secondary | ICD-10-CM

## 2016-10-15 DIAGNOSIS — I10 Essential (primary) hypertension: Secondary | ICD-10-CM | POA: Diagnosis not present

## 2016-10-15 DIAGNOSIS — E785 Hyperlipidemia, unspecified: Secondary | ICD-10-CM | POA: Diagnosis not present

## 2016-10-15 DIAGNOSIS — I2510 Atherosclerotic heart disease of native coronary artery without angina pectoris: Secondary | ICD-10-CM | POA: Diagnosis not present

## 2016-10-15 MED ORDER — RAMIPRIL 10 MG PO CAPS: 10.0000 mg | ORAL_CAPSULE | Freq: Every day | ORAL | 3 refills | Status: DC

## 2016-10-15 MED ORDER — ATORVASTATIN CALCIUM 40 MG PO TABS: 40.0000 mg | ORAL_TABLET | Freq: Every day | ORAL | 3 refills | Status: DC

## 2016-10-15 MED ORDER — EZETIMIBE 10 MG PO TABS: 10.0000 mg | ORAL_TABLET | Freq: Every day | ORAL | 3 refills | Status: DC

## 2016-10-15 MED ORDER — METOPROLOL SUCCINATE ER 50 MG PO TB24: ORAL_TABLET | ORAL | 3 refills | Status: DC

## 2016-10-15 NOTE — Patient Instructions (Signed)
Your physician wants you to follow-up in: 6 months or sooner if needed. You will receive a reminder letter in the mail two months in advance. If you don't receive a letter, please call our office to schedule the follow-up appointment. 

## 2016-10-15 NOTE — Progress Notes (Signed)
Patient ID: Elijah Peters, male   DOB: 1940/10/11, 76 y.o.   MRN: [id number]      Primary MD: Dr. Miguel Aschoff  HPI: Elijah Peters is a 76 y.o. male who presents to the office for a 6 month cardiology followup evaluation  Elijah Peters has established CAD and underwent initial PCI to his LAD in 1992 while living in [place]. In January 1998, he underwent insertion of 2 stents in his left circumflex coronary artery by me. He has documented mild aortic valve sclerosis, mild concentric LV hypertrophy and normal systolic and diastolic function. Has a history of hyperlipidemia requiring combination therapy and mild hypertension.   His last nuclear perfusion study was in November 2013 and continued to show normal perfusion. A follow-up echo Doppler study in March 2015  showed an ejection fraction at 55-60% with grade 1 diastolic dysfunction.  His aortic valve is mildly thickened and mildly calcified without restricted mobility.  There was no stenosis.  There was trivial AR.  He did have mild MR.  His left atrium was mildly dilated.  There is trivial TR.  Pulmonary pressures were normal  Do to insurance  Issues Crestor was switched atorvastatin 40 mg and he has continued to take Zetia 10 mg.  Laboratory i n February 2015 showed a total cholesterol of 104 triglycerides 73 HDL cholesterol 35 LDL cholesterol 54 and VLDL cholesterol 13. He had a normal chemistry profile and glucose was minimally increased at 102. CBC was normal.  When I last saw him he was  walking 1/2 miles per day which was down from previous 2-3 miles per day.  He denied any exertionally precipitated chest tightness.  He does note some fatigability.  He has prostate seeds in place for prostate CA with his PSA being very stable and low.   He was diagnosed with a left sinus mass and has been evaluated by Dr. Sandy Salaam  at [facility].  When I last saw him, he was scheduled to undergo a biopsy of this sinus mass on Thursday, 03/12/2016.  I  recommended that he undergo a nuclear stress test which was done in August 2017.  Prior to any potential major surgery.  Ejection fraction was 63%.  He did not develop ECG changes.  There was normal perfusion.  He had a mild hypertensive blood pressure response to exercise.   He did not require any additional left maxillary surgery since the majority of the tumor was removed at the biopsy.  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  He has undergone chemotherapy with Rituxan and Cytoxan as well as ARA-C.  He also underwent radiation treatment.  He has a Port-A-Cath, which will be removed on March 6.  He has felt well.  He has lost weight.  He is walking daily.  He denies chest pain, PND or orthopnea.  He presents for reevaluation.  Past Medical History:  Diagnosis Date  . CAD (coronary artery disease)   . Cancer (HCC)    LYMPHOMA/ TUMOR LEFT SINUS  . Cataract   . GERD (gastroesophageal reflux disease)   . Heart murmur   . History of nuclear stress test 06/2012   normal pattern of perfusion; low risk scan  . Hyperlipidemia   . Hypertension   . LVH (left ventricular hypertrophy)    mild, concentric  . Mild aortic sclerosis ([place])   . Myocardial infarction 08/1996   non-Q-wave inferolateral   .  Peyronie's disease   . Prostate cancer [facility])     Past Surgical History:  Procedure Laterality Date  . CARDIAC CATHETERIZATION  1992   PTCA to LAD, in [place]  . CATARACT EXTRACTION  08/06/2010   right eye  . CATARACT EXTRACTION W/PHACO Left 09/23/2016   Procedure: CATARACT EXTRACTION PHACO AND INTRAOCULAR LENS PLACEMENT (IOC);  Surgeon: Estill Cotta, MD;  Location: [facility] ORS;  Service: Ophthalmology;  Laterality: Left;  [place] 1:38.5 AP% 25.3 CDE 48.16 Fluid pack lot # [id number]  . CORONARY ANGIOPLASTY WITH STENT PLACEMENT  1998   L circumflex - 3.0x23.9x9 bare metal stent  . EYE SURGERY  2011   Cataracts removed.  Marland Kitchen  HEMORROIDECTOMY    . HERNIA REPAIR  2004  . PROSTATE SURGERY  03/2009   seed implant due to prostate cancer  . TONSILLECTOMY  childhood  . TRANSTHORACIC ECHOCARDIOGRAM  09/01/2011   EF=>55%; mild conc LVH; borderline RV enlargement; LA mildly dilated; mild mitral annular calcif; mild-mod MR; RV systolic pressure elevated; AV mildly sclerotic; mild AV regurg; aortic root sclerosis/calcif    Allergies  Allergen Reactions  . Niacin And Related Itching    Current Outpatient Prescriptions  Medication Sig Dispense Refill  . aspirin 81 MG tablet Take 81 mg by mouth daily.     Marland Kitchen atorvastatin (LIPITOR) 40 MG tablet Take 1 tablet (40 mg total) by mouth at bedtime. 90 tablet 3  . B COMPLEX VITAMINS PO Take 1 tablet by mouth daily as needed (energy).     . calcium elemental as carbonate (TUMS ULTRA 1000) 400 MG chewable tablet Chew 1,000 mg by mouth daily as needed for heartburn.    . COD LIVER OIL PO Take 4 capsules by mouth daily.     . cyclobenzaprine (FLEXERIL) 10 MG tablet Take 1 tablet (10 mg total) by mouth daily as needed for muscle spasms. 90 tablet 0  . ezetimibe (ZETIA) 10 MG tablet Take 1 tablet (10 mg total) by mouth at bedtime. 90 tablet 3  . HYDROcodone-acetaminophen (NORCO/VICODIN) 5-325 MG per tablet Take 1 tablet by mouth daily as needed for moderate pain.     . metoprolol succinate (TOPROL-XL) 50 MG 24 hr tablet Take one (1) tablet (50 mg total) by mouth daily. 90 tablet 3  . naproxen sodium (ANAPROX) 220 MG tablet Take 220 mg by mouth daily as needed (pain).     . Omega-3 Fatty Acids (FISH OIL) 1000 MG CAPS Take 1 capsule by mouth daily.    Marland Kitchen OVER THE COUNTER MEDICATION Saline and baking soda mouth rinse, swish and spit 4 to 5 times daily    . Potassium 99 MG TABS Take 1 tablet by mouth daily as needed (cramps).     . ramipril (ALTACE) 10 MG capsule Take 1 capsule (10 mg total) by mouth daily. 90 capsule 3  . ranitidine (ZANTAC) 150 MG capsule Take 150 mg by mouth 2 (two) times  daily as needed for heartburn.     . sodium chloride (OCEAN) 0.65 % SOLN nasal spray Place 1 spray into both nostrils as needed for congestion.     No current facility-administered medications for this visit.     Social History   Social History  . Marital status: Married    Spouse name: N/A  . Number of children: 4  . Years of education: N/A   Occupational History  . retired [place] Topics  . Smoking status: Former Smoker    Years: 9.00  Types: Cigarettes    Quit date: 08/25/1963  . Smokeless tobacco: Never Used  . Alcohol use 6.0 oz/week    7 Standard drinks or equivalent, 3 Glasses of wine per week  . Drug use: No  . Sexual activity: No   Other Topics Concern  . Not on file   Social History Narrative  . No narrative on file    Family History  Problem Relation Age of Onset  . Emphysema Father   . COPD Father   . Stroke Mother   . Hypertension Brother   . Hyperlipidemia Brother   . Heart disease Brother     CAD   Socially he is married has 4 children 5 grandchildren. He is retired. He walks daily. He does take an occasional glass of wine.   ROS General: Negative; No fevers, chills, or night sweats;  HEENT: Positive for recently diagnosed left maxillary sinus mass which is having some impingement inferiorly on his left eye.  No changes in  hearing, difficulty swallowing Pulmonary: Negative; No cough, wheezing, shortness of breath, hemoptysis Cardiovascular: Negative; No chest pain, presyncope, syncope, palpitations GI: Positive for GERD; No nausea, vomiting, diarrhea, or abdominal pain GU: Negative; No dysuria, hematuria, or difficulty voiding Musculoskeletal: Negative; no myalgias, joint pain, or weakness Hematologic/Oncology: Positive for prostate CA treated with seed implantation; no easy bruising, bleeding Endocrine: Negative; no heat/cold intolerance; no diabetes Neuro: Negative; no changes in balance, headaches Skin:  Negative; No rashes or skin lesions Psychiatric: Negative; No behavioral problems, depression Sleep: Negative; No snoring, daytime sleepiness, hypersomnolence, bruxism, restless legs, hypnogognic hallucinations, no cataplexy Other comprehensive 14 point system review is negative.   PE BP 134/76 (BP Location: Right Arm)   Pulse 69   Ht 5' 11"  (1.803 m)   Wt 173 lb 12.8 oz (78.8 kg)   BMI 24.24 kg/m    Wt Readings from Last 3 Encounters:  10/15/16 173 lb 12.8 oz (78.8 kg)  10/13/16 175 lb (79.4 kg)  09/23/16 178 lb (80.7 kg)   General: Alert, oriented, no distress.  Skin: normal turgor, no rashes HEENT: Normocephalic, atraumatic. Pupils round and reactive; sclera anicteric;no lid lag. No xanthelasma Nose without nasal septal hypertrophy Mouth/Parynx benign; Mallinpatti scale 2 Neck: No JVD, no carotid bruits; normal carotid upstroke Chest wall: Right upper chest Port-A-Cath; No tenderness to palpation Lungs: clear to ausculatation and percussion; no wheezing or rales Heart: RRR, s1 s2 normal 2/6 systolic murmur in the aortic area compatible with his aortic sclerosis. No S3 or S4 gallop. No rubs thrills or heaves to Abdomen: soft, nontender; no hepatosplenomehaly, BS+; abdominal aorta nontender and not dilated by palpation. Back: No CVA Pulses 2+ Musculoskeletal: No joint tenderness Extremities: no clubbing cyanosis or edema, Homan's sign negative  Neurologic: grossly nonfocal Psychological: Normal affect and mood  ECG (independently read by me): Normal sinus rhythm at 69 bpm.  Small Q waves in III and F.  No ST segment changes.  Normal intervals.  July 2017 ECG (independently read by me): Normal sinus rhythm at 84 bpm; old inferior Q waves in leads 3 and aVF.  Normal intervals  November 2016 ECG (independently read by me): Sinus bradycardia 54, old inferior Q waves in 3 and aVF.  Normal intervals.  October 2015 ECG (independently read by me): Sinus bradycardia 55 beats per  minute.  Q waves in leads 3 and aVF  Prior March 2015 ECG (independently read by me): Normal sinus rhythm at 61 beats per minute. Normal intervals. Q waves in  III and F.  Prior 04/28/13 ECG: Sinus rhythm at 55 beats per minute.  LABS: BMP Latest Ref Rng & Units 03/10/2016 02/14/2016 10/10/2014  Glucose 65 - 99 mg/dL 97 - -  BUN 8 - 27 mg/dL 17 - 15  Creatinine 0.76 - 1.27 mg/dL 0.88 0.90 0.9  BUN/Creat Ratio 10 - 24 19 - -  Sodium 134 - 144 mmol/L 138 - 143  Potassium 3.5 - 5.2 mmol/L 4.5 - 5.0  Chloride 96 - 106 mmol/L 99 - -  CO2 18 - 29 mmol/L 24 - -  Calcium 8.6 - 10.2 mg/dL 8.9 - -   Hepatic Function Latest Ref Rng & Units 03/10/2016 10/10/2014 10/16/2013  Total Protein 6.0 - 8.5 g/dL 6.5 - 6.3  Albumin 3.5 - 4.8 g/dL 4.1 - 4.0  AST 0 - 40 IU/L 20 30 22   ALT 0 - 44 IU/L 18 34 20  Alk Phosphatase 39 - 117 IU/L 92 95 88  Total Bilirubin 0.0 - 1.2 mg/dL 1.1 - 0.7  Bilirubin, Direct 0.00 - 0.40 mg/dL - - 0.15   CBC Latest Ref Rng & Units 03/10/2016 10/10/2014 10/16/2013  WBC 3.4 - 10.8 x10E3/uL 5.6 4.7 5.6  Hemoglobin 13.5 - 17.5 g/dL - 14.7 15.1  Hematocrit 37.5 - 51.0 % 42.6 44 44.3  Platelets 150 - 379 x10E3/uL 184 181 187   Lab Results  Component Value Date   MCV 94 03/10/2016   MCV 94 10/16/2013   MCV 96.1 03/26/2009   Lab Results  Component Value Date   TSH 1.590 03/10/2016  No results found for: HGBA1C   Lipid Panel     Component Value Date/Time   CHOL 102 03/10/2016 1237   TRIG 113 03/10/2016 1237   HDL 29 (L) 03/10/2016 1237   LDLCALC 50 03/10/2016 1237     ------------------------------------------------------------------- Echo Doppler study 03/09/2016  Study Conclusions  - Left ventricle: Global longitudinal LV strain is normal at -19%  The cavity size was normal. There was moderate focal basal and  mild concentric hypertrophy. Systolic function was normal. The  estimated ejection fraction was in the range of 60% to 65%. Wall  motion was normal;  there were no regional wall motion  abnormalities. There was an increased relative contribution of  atrial contraction to ventricular filling. Doppler parameters are  consistent with abnormal left ventricular relaxation (grade 1  diastolic dysfunction). - Aortic valve: Moderately calcified annulus. Trileaflet. Mild  diffuse calcification, consistent with sclerosis. There was  trivial regurgitation. - Mitral valve: Calcified annulus. There was mild regurgitation. - Pulmonic valve: There was trivial regurgitation.  IMPRESSION:  1. CAD in native artery   2. Essential hypertension   3. Hyperlipidemia with target LDL less than 70   4. Drug therapy   5. Malignant tumor of maxillary sinus ([place]): Diffuse large B-cell lymphoma, lymphoid neoplasm, clinical stage I E     ASSESSMENT AND PLAN: Elijah Peters is a 63 -year-old white male who is 24 years following initial intervention to his LAD and 20 years following successful stenting with bare-metal stents to his left circumflex coronary artery.  We have been treating him very aggressively with reference to his lipid studies.  Laboratory in March 2014 showed an LDL particle number at  737 ; total cholesterol 75 triglycerides 58 and HDL particle number low at 29.2. He is not having any anginal symptoms. He denies shortness of breath and continues to exercise regularly.  His blood pressure remains controlled on his current therapy consisting of  ramipril, 10 mg and Toprol-XL 50 mg daily.  He is on Zetia 10 mg and atorvastatin 40 mg for his hyperlipidemia and his last LDL level in July 2017 was excellent at 50.  He was  found to have a left maxillary sinus mass which may be causing pressure inferiorly on his left eye.  He was referred to Dr. Darene Lamer at [facility] [place]  a sinus biopsy for 03/12/2016.  I have recommended that he undergo a 2-D echo Doppler study prior to his sinus biopsy to further evaluate his systolic murmur which showed normal systolic  function with an ejection fraction of 60-65%.  There was aortic sclerosis without stenosis.  There was mitral annular calcification with mild MR.   he never required more extensive maxillofacial surgery.  His nuclear perfusion study remains low risk without scar or ischemia.  He now feels well.  He completed chemotherapy and radiation therapy.  He has lost 13 pounds since August 2017.  He is exercising regularly and walking daily.  He's not having anginal symptoms.  His Port-A-Cath will be removed next week.  I will see Ms. 6 months for cardiology reevaluation.  Time spent: 25 minutes  Elijah Sine, MD, [facility]  10/17/2016 10:59 AM

## 2016-10-27 DIAGNOSIS — I10 Essential (primary) hypertension: Secondary | ICD-10-CM | POA: Diagnosis not present

## 2016-10-27 DIAGNOSIS — K219 Gastro-esophageal reflux disease without esophagitis: Secondary | ICD-10-CM | POA: Diagnosis not present

## 2016-10-27 DIAGNOSIS — C8339 Diffuse large B-cell lymphoma, extranodal and solid organ sites: Secondary | ICD-10-CM | POA: Diagnosis not present

## 2016-10-27 DIAGNOSIS — I2510 Atherosclerotic heart disease of native coronary artery without angina pectoris: Secondary | ICD-10-CM | POA: Diagnosis not present

## 2016-10-27 DIAGNOSIS — Z452 Encounter for adjustment and management of vascular access device: Secondary | ICD-10-CM | POA: Diagnosis not present

## 2016-11-24 DIAGNOSIS — H35352 Cystoid macular degeneration, left eye: Secondary | ICD-10-CM | POA: Diagnosis not present

## 2016-12-10 ENCOUNTER — Ambulatory Visit (INDEPENDENT_AMBULATORY_CARE_PROVIDER_SITE_OTHER): Payer: Medicare Other | Admitting: Family Medicine

## 2016-12-10 DIAGNOSIS — I2510 Atherosclerotic heart disease of native coronary artery without angina pectoris: Secondary | ICD-10-CM

## 2016-12-10 DIAGNOSIS — C8331 Diffuse large B-cell lymphoma, lymph nodes of head, face, and neck: Secondary | ICD-10-CM

## 2016-12-15 DIAGNOSIS — H35352 Cystoid macular degeneration, left eye: Secondary | ICD-10-CM | POA: Diagnosis not present

## 2016-12-20 NOTE — Progress Notes (Signed)
Most forms reviewed with pt and wife.

## 2016-12-31 DIAGNOSIS — Z85828 Personal history of other malignant neoplasm of skin: Secondary | ICD-10-CM | POA: Diagnosis not present

## 2016-12-31 DIAGNOSIS — D1800 Hemangioma unspecified site: Secondary | ICD-10-CM | POA: Diagnosis not present

## 2016-12-31 DIAGNOSIS — Z1283 Encounter for screening for malignant neoplasm of skin: Secondary | ICD-10-CM | POA: Diagnosis not present

## 2016-12-31 DIAGNOSIS — L820 Inflamed seborrheic keratosis: Secondary | ICD-10-CM | POA: Diagnosis not present

## 2016-12-31 DIAGNOSIS — I8393 Asymptomatic varicose veins of bilateral lower extremities: Secondary | ICD-10-CM | POA: Diagnosis not present

## 2016-12-31 DIAGNOSIS — L578 Other skin changes due to chronic exposure to nonionizing radiation: Secondary | ICD-10-CM | POA: Diagnosis not present

## 2016-12-31 DIAGNOSIS — I781 Nevus, non-neoplastic: Secondary | ICD-10-CM | POA: Diagnosis not present

## 2016-12-31 DIAGNOSIS — L570 Actinic keratosis: Secondary | ICD-10-CM | POA: Diagnosis not present

## 2016-12-31 DIAGNOSIS — L812 Freckles: Secondary | ICD-10-CM | POA: Diagnosis not present

## 2016-12-31 DIAGNOSIS — D229 Melanocytic nevi, unspecified: Secondary | ICD-10-CM | POA: Diagnosis not present

## 2016-12-31 DIAGNOSIS — L821 Other seborrheic keratosis: Secondary | ICD-10-CM | POA: Diagnosis not present

## 2017-01-12 DIAGNOSIS — H35351 Cystoid macular degeneration, right eye: Secondary | ICD-10-CM | POA: Diagnosis not present

## 2017-01-21 DIAGNOSIS — Z888 Allergy status to other drugs, medicaments and biological substances status: Secondary | ICD-10-CM | POA: Diagnosis not present

## 2017-01-21 DIAGNOSIS — Z9221 Personal history of antineoplastic chemotherapy: Secondary | ICD-10-CM | POA: Diagnosis not present

## 2017-01-21 DIAGNOSIS — R200 Anesthesia of skin: Secondary | ICD-10-CM | POA: Diagnosis not present

## 2017-01-21 DIAGNOSIS — H578 Other specified disorders of eye and adnexa: Secondary | ICD-10-CM | POA: Diagnosis not present

## 2017-01-21 DIAGNOSIS — C8339 Diffuse large B-cell lymphoma, extranodal and solid organ sites: Secondary | ICD-10-CM | POA: Diagnosis not present

## 2017-01-21 DIAGNOSIS — Z923 Personal history of irradiation: Secondary | ICD-10-CM | POA: Diagnosis not present

## 2017-02-08 DIAGNOSIS — H35351 Cystoid macular degeneration, right eye: Secondary | ICD-10-CM | POA: Diagnosis not present

## 2017-02-19 DIAGNOSIS — H532 Diplopia: Secondary | ICD-10-CM | POA: Diagnosis not present

## 2017-03-05 DIAGNOSIS — H532 Diplopia: Secondary | ICD-10-CM | POA: Diagnosis not present

## 2017-03-19 DIAGNOSIS — H532 Diplopia: Secondary | ICD-10-CM | POA: Diagnosis not present

## 2017-03-30 ENCOUNTER — Encounter: Payer: Self-pay | Admitting: *Deleted

## 2017-03-30 ENCOUNTER — Other Ambulatory Visit: Payer: Self-pay | Admitting: *Deleted

## 2017-03-30 DIAGNOSIS — I10 Essential (primary) hypertension: Secondary | ICD-10-CM

## 2017-03-30 DIAGNOSIS — E785 Hyperlipidemia, unspecified: Secondary | ICD-10-CM

## 2017-03-30 DIAGNOSIS — Z79899 Other long term (current) drug therapy: Secondary | ICD-10-CM

## 2017-03-30 DIAGNOSIS — I2510 Atherosclerotic heart disease of native coronary artery without angina pectoris: Secondary | ICD-10-CM

## 2017-04-09 ENCOUNTER — Other Ambulatory Visit: Payer: Self-pay | Admitting: Cardiovascular Disease

## 2017-04-09 DIAGNOSIS — Z79899 Other long term (current) drug therapy: Secondary | ICD-10-CM | POA: Diagnosis not present

## 2017-04-09 DIAGNOSIS — I10 Essential (primary) hypertension: Secondary | ICD-10-CM | POA: Diagnosis not present

## 2017-04-09 DIAGNOSIS — I2510 Atherosclerotic heart disease of native coronary artery without angina pectoris: Secondary | ICD-10-CM | POA: Diagnosis not present

## 2017-04-09 DIAGNOSIS — E785 Hyperlipidemia, unspecified: Secondary | ICD-10-CM | POA: Diagnosis not present

## 2017-04-10 LAB — CBC WITH DIFFERENTIAL/PLATELET
BASOS: 0 %
Basophils Absolute: 0 10*3/uL (ref 0.0–0.2)
EOS (ABSOLUTE): 0.1 10*3/uL (ref 0.0–0.4)
Eos: 1 %
HEMATOCRIT: 42.4 % (ref 37.5–51.0)
Hemoglobin: 14.4 g/dL (ref 13.0–17.7)
IMMATURE GRANULOCYTES: 0 %
Immature Grans (Abs): 0 10*3/uL (ref 0.0–0.1)
LYMPHS ABS: 1.7 10*3/uL (ref 0.7–3.1)
Lymphs: 37 %
MCH: 32.4 pg (ref 26.6–33.0)
MCHC: 34 g/dL (ref 31.5–35.7)
MCV: 96 fL (ref 79–97)
MONOS ABS: 0.6 10*3/uL (ref 0.1–0.9)
Monocytes: 12 %
NEUTROS ABS: 2.3 10*3/uL (ref 1.4–7.0)
NEUTROS PCT: 50 %
PLATELETS: 144 10*3/uL — AB (ref 150–379)
RBC: 4.44 x10E6/uL (ref 4.14–5.80)
RDW: 13.2 % (ref 12.3–15.4)
WBC: 4.7 10*3/uL (ref 3.4–10.8)

## 2017-04-10 LAB — COMPREHENSIVE METABOLIC PANEL
A/G RATIO: 2 (ref 1.2–2.2)
ALK PHOS: 107 IU/L (ref 39–117)
ALT: 17 IU/L (ref 0–44)
AST: 22 IU/L (ref 0–40)
Albumin: 4.4 g/dL (ref 3.5–4.8)
BILIRUBIN TOTAL: 0.9 mg/dL (ref 0.0–1.2)
BUN/Creatinine Ratio: 14 (ref 10–24)
BUN: 12 mg/dL (ref 8–27)
CHLORIDE: 104 mmol/L (ref 96–106)
CO2: 23 mmol/L (ref 20–29)
Calcium: 9.2 mg/dL (ref 8.6–10.2)
Creatinine, Ser: 0.87 mg/dL (ref 0.76–1.27)
GFR calc Af Amer: 96 mL/min/{1.73_m2} (ref >59)
GFR calc non Af Amer: 83 mL/min/{1.73_m2} (ref >59)
GLOBULIN, TOTAL: 2.2 g/dL (ref 1.5–4.5)
Glucose: 96 mg/dL (ref 65–99)
POTASSIUM: 4.7 mmol/L (ref 3.5–5.2)
SODIUM: 140 mmol/L (ref 134–144)
Total Protein: 6.6 g/dL (ref 6.0–8.5)

## 2017-04-10 LAB — LIPID PANEL W/O CHOL/HDL RATIO
Cholesterol, Total: 148 mg/dL (ref 100–199)
HDL: 37 mg/dL — AB (ref >39)
LDL Calculated: 89 mg/dL (ref 0–99)
TRIGLYCERIDES: 112 mg/dL (ref 0–149)
VLDL Cholesterol Cal: 22 mg/dL (ref 5–40)

## 2017-04-10 LAB — TSH: TSH: 1.42 u[IU]/mL (ref 0.450–4.500)

## 2017-04-12 ENCOUNTER — Ambulatory Visit: Payer: Medicare Other | Admitting: Family Medicine

## 2017-04-14 ENCOUNTER — Ambulatory Visit (INDEPENDENT_AMBULATORY_CARE_PROVIDER_SITE_OTHER): Payer: Medicare Other | Admitting: Family Medicine

## 2017-04-14 ENCOUNTER — Encounter: Payer: Self-pay | Admitting: Family Medicine

## 2017-04-14 VITALS — BP 124/64 | HR 64 | Temp 97.6°F | Resp 16 | Wt 180.0 lb

## 2017-04-14 DIAGNOSIS — I10 Essential (primary) hypertension: Secondary | ICD-10-CM

## 2017-04-14 DIAGNOSIS — Z23 Encounter for immunization: Secondary | ICD-10-CM

## 2017-04-14 DIAGNOSIS — I2510 Atherosclerotic heart disease of native coronary artery without angina pectoris: Secondary | ICD-10-CM | POA: Diagnosis not present

## 2017-04-14 DIAGNOSIS — C8331 Diffuse large B-cell lymphoma, lymph nodes of head, face, and neck: Secondary | ICD-10-CM | POA: Diagnosis not present

## 2017-04-14 DIAGNOSIS — E785 Hyperlipidemia, unspecified: Secondary | ICD-10-CM

## 2017-04-14 NOTE — Progress Notes (Signed)
Patient: Elijah Peters Male    DOB: [birth year]   76 y.o.   MRN: [id number] Visit Date: 04/14/2017  Today's Provider: [name], MD   Chief Complaint  Patient presents with  . Hypertension   Subjective:    HPI  Hypertension, follow-up:  BP Readings from Last 3 Encounters:  04/14/17 124/64  10/15/16 134/76  10/13/16 (!) 142/80    He was last seen for hypertension 6 months ago.  BP at that visit was 142/80. Management since that visit includes none. He reports good compliance with treatment. He is not having side effects.  He is exercising. He is adherent to low salt diet.   Outside blood pressures are not being checked. Patient denies chest pain, chest pressure/discomfort, claudication, dyspnea, exertional chest pressure/discomfort, irregular heart beat, lower extremity edema, near-syncope, orthopnea, palpitations, paroxysmal nocturnal dyspnea, syncope and tachypnea.   Wt Readings from Last 3 Encounters:  04/14/17 180 lb (81.6 kg)  10/15/16 173 lb 12.8 oz (78.8 kg)  10/13/16 175 lb (79.4 kg)    ------------------------------------------------------------------------     Allergies  Allergen Reactions  . Niacin And Related Itching     Current Outpatient Prescriptions:  .  aspirin 81 MG tablet, Take 81 mg by mouth daily. , Disp: , Rfl:  .  atorvastatin (LIPITOR) 40 MG tablet, Take 1 tablet (40 mg total) by mouth at bedtime., Disp: 90 tablet, Rfl: 3 .  B COMPLEX VITAMINS PO, Take 1 tablet by mouth daily as needed (energy). , Disp: , Rfl:  .  calcium elemental as carbonate (TUMS ULTRA 1000) 400 MG chewable tablet, Chew 1,000 mg by mouth daily as needed for heartburn., Disp: , Rfl:  .  COD LIVER OIL PO, Take 4 capsules by mouth daily. , Disp: , Rfl:  .  cyclobenzaprine (FLEXERIL) 10 MG tablet, Take 1 tablet (10 mg total) by mouth daily as needed for muscle spasms., Disp: 90 tablet, Rfl: 0 .  ezetimibe (ZETIA) 10 MG tablet, Take 1 tablet (10 mg total)  by mouth at bedtime., Disp: 90 tablet, Rfl: 3 .  fluticasone (FLONASE) 50 MCG/ACT nasal spray, 1 spray by Left Nare route daily., Disp: , Rfl:  .  HYDROcodone-acetaminophen (NORCO/VICODIN) 5-325 MG per tablet, Take 1 tablet by mouth daily as needed for moderate pain. , Disp: , Rfl:  .  metoprolol succinate (TOPROL-XL) 50 MG 24 hr tablet, Take one (1) tablet (50 mg total) by mouth daily., Disp: 90 tablet, Rfl: 3 .  Omega-3 Fatty Acids (FISH OIL) 1000 MG CAPS, Take 1 capsule by mouth daily., Disp: , Rfl:  .  Potassium 99 MG TABS, Take 1 tablet by mouth daily as needed (cramps). , Disp: , Rfl:  .  ramipril (ALTACE) 10 MG capsule, Take 1 capsule (10 mg total) by mouth daily., Disp: 90 capsule, Rfl: 3 .  ranitidine (ZANTAC) 150 MG capsule, Take 150 mg by mouth 2 (two) times daily as needed for heartburn. , Disp: , Rfl:  .  sodium chloride (OCEAN) 0.65 % SOLN nasal spray, Place 1 spray into both nostrils as needed for congestion., Disp: , Rfl:  .  naproxen sodium (ANAPROX) 220 MG tablet, Take 220 mg by mouth daily as needed (pain). , Disp: , Rfl:  .  OVER THE COUNTER MEDICATION, Saline and baking soda mouth rinse, swish and spit 4 to 5 times daily, Disp: , Rfl:   Review of Systems  Constitutional: Negative.   HENT: Negative.   Eyes: Negative.  Respiratory: Negative.   Cardiovascular: Negative.   Gastrointestinal: Negative.   Endocrine: Negative.   Genitourinary: Negative.   Musculoskeletal: Negative.   Skin: Negative.   Allergic/Immunologic: Negative.   Neurological: Negative.   Hematological: Negative.   Psychiatric/Behavioral: Negative.     Social History  Substance Use Topics  . Smoking status: Former Smoker    Years: 9.00    Types: Cigarettes    Quit date: 08/25/1963  . Smokeless tobacco: Never Used  . Alcohol use 6.0 oz/week    7 Standard drinks or equivalent, 3 Glasses of wine per week   Objective:   BP 124/64 (BP Location: Left Arm, Patient Position: Sitting, Cuff Size:  Normal)   Pulse 64   Temp 97.6 F (36.4 C) (Oral)   Resp 16   Wt 180 lb (81.6 kg)   BMI 25.10 kg/m  Vitals:   04/14/17 1628  BP: 124/64  Pulse: 64  Resp: 16  Temp: 97.6 F (36.4 C)  TempSrc: Oral  Weight: 180 lb (81.6 kg)     Physical Exam  Constitutional: He is oriented to person, place, and time. He appears well-developed and well-nourished.  HENT:  Head: Normocephalic and atraumatic.  Eyes: Pupils are equal, round, and reactive to light. Conjunctivae and EOM are normal.  Neck: Normal range of motion. Neck supple. No thyromegaly present.  Cardiovascular: Normal rate, regular rhythm, normal heart sounds and intact distal pulses.   Pulmonary/Chest: Effort normal and breath sounds normal.  Abdominal: Soft.  Musculoskeletal: Normal range of motion.  Lymphadenopathy:    He has no cervical adenopathy.  Neurological: He is alert and oriented to person, place, and time. He has normal reflexes.  Skin: Skin is warm and dry.  Psychiatric: He has a normal mood and affect. His behavior is normal. Judgment and thought content normal.        Assessment & Plan:     1. Essential hypertension Stable. Follow up 6 months.   2. Hyperlipidemia with target LDL less than 70   3. Diffuse large B-cell lymphoma of lymph nodes of head ([name]) Per [facility].  4. Coronary artery disease involving native coronary artery of native heart without angina pectoris   5. Need for influenza vaccination  - Flu vaccine HIGH DOSE PF.     HPI, Exam, and A&P Transcribed under the direction and in the presence of Gerardo Caiazzo L. Cranford Mon, MD  Electronically Signed: Katina Dung, CMA  I have done the exam and reviewed the above chart and it is accurate to the best of my knowledge. Development worker, community has been used in this note in any air is in the dictation or transcription are unintentional.  [name], MD  [place]

## 2017-04-19 DIAGNOSIS — H532 Diplopia: Secondary | ICD-10-CM | POA: Diagnosis not present

## 2017-04-29 DIAGNOSIS — Z9221 Personal history of antineoplastic chemotherapy: Secondary | ICD-10-CM | POA: Diagnosis not present

## 2017-04-29 DIAGNOSIS — C8339 Diffuse large B-cell lymphoma, extranodal and solid organ sites: Secondary | ICD-10-CM | POA: Diagnosis not present

## 2017-04-29 DIAGNOSIS — Z923 Personal history of irradiation: Secondary | ICD-10-CM | POA: Diagnosis not present

## 2017-04-29 DIAGNOSIS — H532 Diplopia: Secondary | ICD-10-CM | POA: Diagnosis not present

## 2017-06-07 DIAGNOSIS — H532 Diplopia: Secondary | ICD-10-CM | POA: Diagnosis not present

## 2017-06-07 DIAGNOSIS — H43812 Vitreous degeneration, left eye: Secondary | ICD-10-CM | POA: Diagnosis not present

## 2017-06-07 DIAGNOSIS — H518 Other specified disorders of binocular movement: Secondary | ICD-10-CM | POA: Diagnosis not present

## 2017-07-01 ENCOUNTER — Encounter: Payer: Self-pay | Admitting: Family Medicine

## 2017-07-01 ENCOUNTER — Ambulatory Visit (INDEPENDENT_AMBULATORY_CARE_PROVIDER_SITE_OTHER): Payer: Medicare Other | Admitting: Family Medicine

## 2017-07-01 VITALS — BP 122/80 | HR 89 | Temp 98.5°F | Resp 16 | Wt 179.8 lb

## 2017-07-01 DIAGNOSIS — I2510 Atherosclerotic heart disease of native coronary artery without angina pectoris: Secondary | ICD-10-CM

## 2017-07-01 DIAGNOSIS — L03115 Cellulitis of right lower limb: Secondary | ICD-10-CM | POA: Diagnosis not present

## 2017-07-01 DIAGNOSIS — S8011XA Contusion of right lower leg, initial encounter: Secondary | ICD-10-CM

## 2017-07-01 MED ORDER — CEPHALEXIN 500 MG PO CAPS: 500.0000 mg | ORAL_CAPSULE | Freq: Two times a day (BID) | ORAL | 0 refills | Status: DC

## 2017-07-01 NOTE — Patient Instructions (Signed)
Stop high dose aspirin. Use Tylenol for pain. Resume elevation and cold compresses for 20 minutes 3-4 x day.

## 2017-07-01 NOTE — Progress Notes (Signed)
Subjective:     Patient ID: Elijah Peters, male   DOB: [birth year], 76 y.o.   MRN: [id number] Chief Complaint  Patient presents with  . Leg Pain    Patient comes in office today with concerns of redness to his right leg. Patient states that in the past 4-5 days he recalls hitting his leg on a bench. Patient reports that skin has become red and spread down into his foot.   States he applied ice and Neosporin and increased his aspirin to 325 mg twice daily. Reports continued pain and tenderness. Accompanied by his wife today. UTD with tetanus. HPI   Review of Systems     Objective:   Physical Exam  Constitutional: He appears well-developed and well-nourished. No distress.  Cardiovascular:  Pulses:      Dorsalis pedis pulses are 2+ on the right side.       Posterior tibial pulses are 2+ on the right side.  Skin:  Right lower extremity with linear abrasion with hematoma superior and erythema inferior to the wound. Dependent ecchymosis to the plantar aspect of the foot.       Assessment:    1. Traumatic hematoma of right lower leg, initial encounter: stop high dose ASA  2. Cellulitis of right leg; Keflex 500 bid x 7 days.    Plan:    Encourage elevation and cold compresses. Tylenol for pain.

## 2017-07-06 ENCOUNTER — Ambulatory Visit (INDEPENDENT_AMBULATORY_CARE_PROVIDER_SITE_OTHER): Payer: Medicare Other | Admitting: Family Medicine

## 2017-07-06 ENCOUNTER — Encounter: Payer: Self-pay | Admitting: Family Medicine

## 2017-07-06 VITALS — BP 120/74 | HR 71 | Temp 97.8°F | Resp 16 | Wt 181.4 lb

## 2017-07-06 DIAGNOSIS — S8011XA Contusion of right lower leg, initial encounter: Secondary | ICD-10-CM

## 2017-07-06 DIAGNOSIS — I2510 Atherosclerotic heart disease of native coronary artery without angina pectoris: Secondary | ICD-10-CM | POA: Diagnosis not present

## 2017-07-06 MED ORDER — SODIUM CHLORIDE 0.9 % EX SOLN: 2.0000 "application " | Freq: Two times a day (BID) | CUTANEOUS | 0 refills | Status: DC

## 2017-07-06 NOTE — Patient Instructions (Signed)
Cleanse wound with saline solution and apply a dressing. We will call with the x-ray report and decide on refilling the antibiotic at that time.

## 2017-07-06 NOTE — Progress Notes (Signed)
Subjective:     Patient ID: Elijah Peters, male   DOB: Aug 25, 1940, 76 y.o.   MRN: [id number]  HPI  Chief Complaint  Patient presents with  . Cellulitis    Patient returns to office today for 5 day follow up, patient reports that he still has two days left of antibiotic and is still having pain and swelling. Patient states that swelling has improved some but he still has pain in the leg when waking up in the morning or prolonged sitting.   No fever or chills reported. Accompanied by his wife today.   Review of Systems     Objective:   Physical Exam  Constitutional: He appears well-developed and well-nourished. No distress.  Skin:  Right lower extremity wound with eschar and resolving hematoma. Erythema surrounds wound which remains tender. Dependent ecchymosis resolving.       Assessment:    1. Traumatic hematoma of right lower leg, initial encounter: r/o fracture. - DG Tibia/Fibula Right; Future    Plan:    Will start wound care with saline irrigation and dressing. Further f/u pending x-ray report. Consider refilling cephalexin @ 7days.

## 2017-07-07 ENCOUNTER — Other Ambulatory Visit: Payer: Self-pay | Admitting: Family Medicine

## 2017-07-07 ENCOUNTER — Ambulatory Visit
Admission: RE | Admit: 2017-07-07 | Discharge: 2017-07-07 | Disposition: A | Discharge status: Home / Self Care | Payer: Medicare Other | Source: Ambulatory Visit | Attending: Family Medicine | Admitting: Family Medicine

## 2017-07-07 ENCOUNTER — Telehealth: Payer: Self-pay

## 2017-07-07 DIAGNOSIS — S8011XA Contusion of right lower leg, initial encounter: Secondary | ICD-10-CM | POA: Diagnosis not present

## 2017-07-07 DIAGNOSIS — X58XXXA Exposure to other specified factors, initial encounter: Secondary | ICD-10-CM | POA: Insufficient documentation

## 2017-07-07 DIAGNOSIS — M79661 Pain in right lower leg: Secondary | ICD-10-CM | POA: Diagnosis not present

## 2017-07-07 DIAGNOSIS — S8991XA Unspecified injury of right lower leg, initial encounter: Secondary | ICD-10-CM | POA: Diagnosis not present

## 2017-07-07 MED ORDER — CEPHALEXIN 500 MG PO CAPS: 500.0000 mg | ORAL_CAPSULE | Freq: Four times a day (QID) | ORAL | 0 refills | Status: DC

## 2017-07-07 NOTE — Telephone Encounter (Signed)
-----   Message from Carmon Ginsberg, [place] sent at 07/07/2017  9:52 AM EST ----- No fracture. I will refill the cephalexin but increase dose to 4 x day. Follow up in the office next Tuesday or so.

## 2017-07-07 NOTE — Telephone Encounter (Signed)
Patient was advised he made appt for next Monday because he will be out of town effective Tuesday. KW

## 2017-07-07 NOTE — Telephone Encounter (Signed)
[email or web address] . KW 

## 2017-07-07 NOTE — Telephone Encounter (Deleted)
-----   Message from Carmon Ginsberg, [place] sent at 07/07/2017  9:52 AM EST ----- No fracture. I will refill the cephalexin but increase dose to 4 x day. Follow up in the office next Tuesday or so.

## 2017-07-12 ENCOUNTER — Ambulatory Visit (INDEPENDENT_AMBULATORY_CARE_PROVIDER_SITE_OTHER): Payer: Medicare Other | Admitting: Family Medicine

## 2017-07-12 ENCOUNTER — Encounter: Payer: Self-pay | Admitting: Family Medicine

## 2017-07-12 VITALS — BP 124/66 | HR 61 | Temp 97.9°F | Resp 15 | Wt 180.8 lb

## 2017-07-12 DIAGNOSIS — S8011XD Contusion of right lower leg, subsequent encounter: Secondary | ICD-10-CM | POA: Diagnosis not present

## 2017-07-12 DIAGNOSIS — I2510 Atherosclerotic heart disease of native coronary artery without angina pectoris: Secondary | ICD-10-CM | POA: Diagnosis not present

## 2017-07-12 NOTE — Progress Notes (Signed)
Subjective:     Patient ID: Elijah Peters, male   DOB: 1941/08/03, 76 y.o.   MRN: [id number] Chief Complaint  Patient presents with  . Hematoma    Patient returns back to office today for one week follow up, at last office visit 07/06/17 we increased Keflex to 4x daily. Patient reports redness and some slight swelling of skin.   Initial injury approximately 06/27/17. Reports compliance with cephalexin and saline irrigation/dressing. Accompanied by his wife. HPI   Review of Systems     Objective:   Physical Exam  Constitutional: He appears well-developed and well-nourished. No distress.  Skin:  Right lower extremity with mild residual swelling with decreasing overlying erythema. Remains tender to the touch. Wound with small residual eschar but no drainage.       Assessment:    1. Traumatic hematoma of right lower leg, subsequent encounter; improving    Plan:    Continue saline irrigation/ dressings. Complete abx. Return is prn not continuing to improve or new symptoms.

## 2017-07-12 NOTE — Patient Instructions (Signed)
Complete antibiotics and continue saline irrigation and dressing until wound closed over.

## 2017-07-22 ENCOUNTER — Ambulatory Visit (INDEPENDENT_AMBULATORY_CARE_PROVIDER_SITE_OTHER): Payer: Medicare Other | Admitting: Cardiovascular Disease

## 2017-07-22 ENCOUNTER — Encounter: Payer: Self-pay | Admitting: Cardiovascular Disease

## 2017-07-22 VITALS — BP 116/74 | HR 49 | Ht 71.0 in | Wt 180.6 lb

## 2017-07-22 DIAGNOSIS — I2510 Atherosclerotic heart disease of native coronary artery without angina pectoris: Secondary | ICD-10-CM | POA: Diagnosis not present

## 2017-07-22 DIAGNOSIS — R001 Bradycardia, unspecified: Secondary | ICD-10-CM

## 2017-07-22 DIAGNOSIS — I10 Essential (primary) hypertension: Secondary | ICD-10-CM

## 2017-07-22 DIAGNOSIS — C310 Malignant neoplasm of maxillary sinus: Secondary | ICD-10-CM

## 2017-07-22 DIAGNOSIS — Z79899 Other long term (current) drug therapy: Secondary | ICD-10-CM | POA: Diagnosis not present

## 2017-07-22 DIAGNOSIS — E785 Hyperlipidemia, unspecified: Secondary | ICD-10-CM

## 2017-07-22 LAB — LIPID PANEL
CHOL/HDL RATIO: 2.8 ratio (ref 0.0–5.0)
Cholesterol, Total: 95 mg/dL — ABNORMAL LOW (ref 100–199)
HDL: 34 mg/dL — ABNORMAL LOW (ref >39)
LDL CALC: 42 mg/dL (ref 0–99)
Triglycerides: 94 mg/dL (ref 0–149)
VLDL CHOLESTEROL CAL: 19 mg/dL (ref 5–40)

## 2017-07-22 MED ORDER — METOPROLOL SUCCINATE ER 50 MG PO TB24: ORAL_TABLET | ORAL | 3 refills | Status: DC

## 2017-07-22 NOTE — Progress Notes (Signed)
Patient ID: Elijah Peters, male   DOB: Aug 28, 1940, 76 y.o.   MRN: [id number]      Primary MD: Dr. Miguel Aschoff  HPI: Elijah Peters is a 76 y.o. male who presents to the office for a 6 month cardiology followup evaluation  Elijah Peters has established CAD and underwent initial PCI to his LAD in 1992 while living in [place]. In January 1998, he underwent insertion of 2 stents in his left circumflex coronary artery by me. He has documented mild aortic valve sclerosis, mild concentric LV hypertrophy and normal systolic and diastolic function. Has a history of hyperlipidemia requiring combination therapy and mild hypertension.   His last nuclear perfusion study was in November 2013 and continued to show normal perfusion. A follow-up echo Doppler study in March 2015  showed an ejection fraction at 55-60% with grade 1 diastolic dysfunction.  His aortic valve is mildly thickened and mildly calcified without restricted mobility.  There was no stenosis.  There was trivial AR.  He did have mild MR.  His left atrium was mildly dilated.  There is trivial TR.  Pulmonary pressures were normal  Do to insurance issues Crestor was switched atorvastatin 40 mg and he has continued to take Zetia 10 mg.  Laboratory i n February 2015 showed a total cholesterol of 104 triglycerides 73 HDL cholesterol 35 LDL cholesterol 54 and VLDL cholesterol 13. He had a normal chemistry profile and glucose was minimally increased at 102. CBC was normal.  When I last saw him he was  walking 1/2 miles per day which was down from previous 2-3 miles per day.  He denied any exertionally precipitated chest tightness.  He does note some fatigability.  He has prostate seeds in place for prostate CA with his PSA being very stable and low.   He was diagnosed with a left sinus mass and has been evaluated by Dr. Sandy Salaam  at [facility].  When I last saw him, he was scheduled to undergo a biopsy of this sinus mass on Thursday, 03/12/2016.  I  recommended that he undergo a nuclear stress test which was done in August 2017.  Prior to any potential major surgery.  Ejection fraction was 63%.  He did not develop ECG changes.  There was normal perfusion.  He had a mild hypertensive blood pressure response to exercise.   He did not require any additional left maxillary surgery since the majority of the tumor was removed at the biopsy.  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  He has undergone chemotherapy with Rituxan and Cytoxan as well as ARA-C.  He also underwent radiation treatment.  He has a Port-A-Cath, which will be removed on March 6.  Since I last saw him in February 2018.  He has felt very well.  He specifically denies any episodes of chest pain or palpitations.  He denies any shortness of breath.  He recently banged his right shin on the edge of chair resulting in significant ecchymosis and contusion.  3 weeks ago which is slowly improving.  He has noted his pulse at home typically in the 50s or 60s.  He denies any significant orthostatic changes.  His last echo Doppler in September 2017 showed normal EF of 60-65% with grade 1 diastolic dysfunction, and mild diffuse aortic sclerosis and mitral annular calcification with trivial AR and mild MR.  When he was undergoing chemotherapy, he had stopped some of  his cholesterol medication and a subsequent lab study did show an increase in his LDL cholesterol back to 89 from a previous level of 50.  He is now back taking atorvastatin 40 mg and Zetia 10 mg.  He continues to take Toprol-XL 50 [place] grams daily and ramipril 10 mg for hypertension.  He presents for reevaluation.  Past Medical History:  Diagnosis Date  . CAD (coronary artery disease)   . Cancer (HCC)    LYMPHOMA/ TUMOR LEFT SINUS  . Cataract   . GERD (gastroesophageal reflux disease)   . Heart murmur   . History of nuclear stress test 06/2012   normal pattern of  perfusion; low risk scan  . Hyperlipidemia   . Hypertension   . LVH (left ventricular hypertrophy)    mild, concentric  . Mild aortic sclerosis ([place])   . Myocardial infarction ([place]) 08/1996   non-Q-wave inferolateral   . Peyronie's disease   . Prostate cancer [facility])     Past Surgical History:  Procedure Laterality Date  . CARDIAC CATHETERIZATION  1992   PTCA to LAD, in [place]  . CATARACT EXTRACTION  08/06/2010   right eye  . CATARACT EXTRACTION W/PHACO Left 09/23/2016   Procedure: CATARACT EXTRACTION PHACO AND INTRAOCULAR LENS PLACEMENT (IOC);  Surgeon: Estill Cotta, MD;  Location: [facility] ORS;  Service: Ophthalmology;  Laterality: Left;  [place] 1:38.5 AP% 25.3 CDE 48.16 Fluid pack lot # [id number]  . CORONARY ANGIOPLASTY WITH STENT PLACEMENT  1998   L circumflex - 3.0x23.9x9 bare metal stent  . EYE SURGERY  2011   Cataracts removed.  Marland Kitchen HEMORROIDECTOMY    . HERNIA REPAIR  2004  . PROSTATE SURGERY  03/2009   seed implant due to prostate cancer  . TONSILLECTOMY  childhood  . TRANSTHORACIC ECHOCARDIOGRAM  09/01/2011   EF=>55%; mild conc LVH; borderline RV enlargement; LA mildly dilated; mild mitral annular calcif; mild-mod MR; RV systolic pressure elevated; AV mildly sclerotic; mild AV regurg; aortic root sclerosis/calcif    Allergies  Allergen Reactions  . Niacin And Related Itching    Current Outpatient Medications  Medication Sig Dispense Refill  . aspirin 81 MG tablet Take 81 mg by mouth daily.     Marland Kitchen atorvastatin (LIPITOR) 40 MG tablet Take 1 tablet (40 mg total) by mouth at bedtime. 90 tablet 3  . B COMPLEX VITAMINS PO Take 1 tablet by mouth daily as needed (energy).     . calcium elemental as carbonate (TUMS ULTRA 1000) 400 MG chewable tablet Chew 1,000 mg by mouth daily as needed for heartburn.    . cyclobenzaprine (FLEXERIL) 10 MG tablet Take 1 tablet (10 mg total) by mouth daily as needed for muscle spasms. 90 tablet 0  . ezetimibe (ZETIA) 10 MG tablet Take 1 tablet (10  mg total) by mouth at bedtime. 90 tablet 3  . HYDROcodone-acetaminophen (NORCO/VICODIN) 5-325 MG per tablet Take 1 tablet by mouth daily as needed for moderate pain.     . metoprolol succinate (TOPROL-XL) 50 MG 24 hr tablet Alternate taking 50 mg (1 tablet) daily and 25 mg (1/2 tablet) daily 90 tablet 3  . Omega-3 Fatty Acids (FISH OIL) 1000 MG CAPS Take 1 capsule by mouth daily.    Marland Kitchen OVER THE COUNTER MEDICATION Saline and baking soda mouth rinse, swish and spit 4 to 5 times daily    . Potassium 99 MG TABS Take 1 tablet by mouth daily as needed (cramps).     . ramipril (ALTACE) 10 MG capsule Take  1 capsule (10 mg total) by mouth daily. 90 capsule 3  . ranitidine (ZANTAC) 150 MG capsule Take 150 mg by mouth 2 (two) times daily as needed for heartburn.     . sodium chloride (OCEAN) 0.65 % SOLN nasal spray Place 1 spray into both nostrils as needed for congestion.     No current facility-administered medications for this visit.     Social History   Socioeconomic History  . Marital status: Married    Spouse name: Not on file  . Number of children: 4  . Years of education: Not on file  . Highest education level: Not on file  Social Needs  . Financial resource strain: Not on file  . Food insecurity - worry: Not on file  . Food insecurity - inability: Not on file  . Transportation needs - medical: Not on file  . Transportation needs - non-medical: Not on file  Occupational History  . Occupation: retired    Fish farm manager: [employer]  Tobacco Use  . Smoking status: Former Smoker    Years: 9.00    Types: Cigarettes    Last attempt to quit: 08/25/1963    Years since quitting: 53.9  . Smokeless tobacco: Never Used  Substance and Sexual Activity  . Alcohol use: Yes    Alcohol/week: 6.0 oz    Types: 7 Standard drinks or equivalent, 3 Glasses of wine per week  . Drug use: No  . Sexual activity: No  Other Topics Concern  . Not on file  Social History Narrative  . Not on file    Family  History  Problem Relation Age of Onset  . Emphysema Father   . COPD Father   . Stroke Mother   . Hypertension Brother   . Hyperlipidemia Brother   . Heart disease Brother        CAD   Socially he is married has 4 children 5 grandchildren. He is retired. He walks daily. He does take an occasional glass of wine.   ROS General: Negative; No fevers, chills, or night sweats;  HEENT: Positive for recently diagnosed left maxillary sinus mass which is having some impingement inferiorly on his left eye.  No changes in  hearing, difficulty swallowing Pulmonary: Negative; No cough, wheezing, shortness of breath, hemoptysis Cardiovascular: Negative; No chest pain, presyncope, syncope, palpitations GI: Positive for GERD; No nausea, vomiting, diarrhea, or abdominal pain GU: Negative; No dysuria, hematuria, or difficulty voiding Musculoskeletal: Right shin contusion Hematologic/Oncology: Positive for prostate CA treated with seed implantation; no easy bruising, bleeding Endocrine: Negative; no heat/cold intolerance; no diabetes Neuro: Negative; no changes in balance, headaches Skin: Negative; No rashes or skin lesions Psychiatric: Negative; No behavioral problems, depression Sleep: Negative; No snoring, daytime sleepiness, hypersomnolence, bruxism, restless legs, hypnogognic hallucinations, no cataplexy Other comprehensive 14 point system review is negative.   PE BP 116/74   Pulse (!) 49   Ht 5' 11"  (1.803 m)   Wt 180 lb 9.6 oz (81.9 kg)   BMI 25.19 kg/m    Repeat blood pressure by me was 126/74  Wt Readings from Last 3 Encounters:  07/22/17 180 lb 9.6 oz (81.9 kg)  07/12/17 180 lb 12.8 oz (82 kg)  07/06/17 181 lb 6.4 oz (82.3 kg)   General: Alert, oriented, no distress.  Skin: normal turgor, no rashes, warm and dry HEENT: Normocephalic, atraumatic. Pupils equal round and reactive to light; sclera anicteric; extraocular muscles intact;  Nose without nasal septal  hypertrophy Mouth/Parynx benign; Mallinpatti scale 2  Neck: No JVD, no carotid bruits; normal carotid upstroke Lungs: clear to ausculatation and percussion; no wheezing or rales Chest wall: without tenderness to palpitation Heart: PMI not displaced, RRR, s1 s2 normal, 2/6 systolic murmur in the aortic area, no diastolic murmur, no rubs, gallops, thrills, or heaves Abdomen: soft, nontender; no hepatosplenomehaly, BS+; abdominal aorta nontender and not dilated by palpation. Back: no CVA tenderness Pulses 2+ Musculoskeletal: Right shin contusion with residual ecchymosis Extremities: no clubbing cyanosis or edema, Homan's sign negative  Neurologic: grossly nonfocal; Cranial nerves grossly wnl Psychologic: Normal mood and affect   ECG (independently read by me): Sinus bradycardia at 49 bpm.  No ectopy.  Normal intervals.  February 2018 ECG (independently read by me): Normal sinus rhythm at 69 bpm.  Small Q waves in III and F.  No ST segment changes.  Normal intervals.  July 2017 ECG (independently read by me): Normal sinus rhythm at 84 bpm; old inferior Q waves in leads 3 and aVF.  Normal intervals  November 2016 ECG (independently read by me): Sinus bradycardia 54, old inferior Q waves in 3 and aVF.  Normal intervals.  October 2015 ECG (independently read by me): Sinus bradycardia 55 beats per minute.  Q waves in leads 3 and aVF  March 2015 ECG (independently read by me): Normal sinus rhythm at 61 beats per minute. Normal intervals. Q waves in III and F.  Prior 04/28/13 ECG: Sinus rhythm at 55 beats per minute.  LABS: BMP Latest Ref Rng & Units 04/09/2017 03/10/2016 02/14/2016  Glucose 65 - 99 mg/dL 96 97 -  BUN 8 - 27 mg/dL 12 17 -  Creatinine 0.76 - 1.27 mg/dL 0.87 0.88 0.90  BUN/Creat Ratio 10 - 24 14 19  -  Sodium 134 - 144 mmol/L 140 138 -  Potassium 3.5 - 5.2 mmol/L 4.7 4.5 -  Chloride 96 - 106 mmol/L 104 99 -  CO2 20 - 29 mmol/L 23 24 -  Calcium 8.6 - 10.2 mg/dL 9.2 8.9 -    Hepatic Function Latest Ref Rng & Units 04/09/2017 03/10/2016 10/10/2014  Total Protein 6.0 - 8.5 g/dL 6.6 6.5 -  Albumin 3.5 - 4.8 g/dL 4.4 4.1 -  AST 0 - 40 IU/L 22 20 30   ALT 0 - 44 IU/L 17 18 34  Alk Phosphatase 39 - 117 IU/L 107 92 95  Total Bilirubin 0.0 - 1.2 mg/dL 0.9 1.1 -  Bilirubin, Direct 0.00 - 0.40 mg/dL - - -   CBC Latest Ref Rng & Units 04/09/2017 03/10/2016 10/10/2014  WBC 3.4 - 10.8 x10E3/uL 4.7 5.6 4.7  Hemoglobin 13.0 - 17.7 g/dL 14.4 14.2 14.7  Hematocrit 37.5 - 51.0 % 42.4 42.6 44  Platelets 150 - 379 x10E3/uL 144(L) 184 181   Lab Results  Component Value Date   MCV 96 04/09/2017   MCV 94 03/10/2016   MCV 94 10/16/2013   Lab Results  Component Value Date   TSH 1.420 04/09/2017  No results found for: HGBA1C   Lipid Panel     Component Value Date/Time   CHOL 95 (L) 07/22/2017 1038   TRIG 94 07/22/2017 1038   HDL 34 (L) 07/22/2017 1038   CHOLHDL 2.8 07/22/2017 1038   LDLCALC 42 07/22/2017 1038     ------------------------------------------------------------------- Echo Doppler study 03/09/2016  Study Conclusions  - Left ventricle: Global longitudinal LV strain is normal at -19%  The cavity size was normal. There was moderate focal basal and  mild concentric hypertrophy. Systolic function was normal. The  estimated ejection fraction was in the range of 60% to 65%. Wall  motion was normal; there were no regional wall motion  abnormalities. There was an increased relative contribution of  atrial contraction to ventricular filling. Doppler parameters are  consistent with abnormal left ventricular relaxation (grade 1  diastolic dysfunction). - Aortic valve: Moderately calcified annulus. Trileaflet. Mild  diffuse calcification, consistent with sclerosis. There was  trivial regurgitation. - Mitral valve: Calcified annulus. There was mild regurgitation. - Pulmonic valve: There was trivial regurgitation.  IMPRESSION:  1. CAD in native  artery   2. Essential hypertension   3. Hyperlipidemia with target LDL less than 70   4. Bradycardia   5. Medication management   6. Malignant tumor of maxillary sinus ([place]): Diffuse large B-cell lymphoma, lymphoid neoplasm, clinical stage I E      ASSESSMENT AND PLAN: Mr. Golubski is a 21 -year-old white male who is 24 years following initial intervention to his LAD in 91 while living in [place] and almost 21 years years following successful stenting with bare-metal stents to his left circumflex coronary artery.  We have been treating him very aggressively with reference to his lipid studies.  Laboratory in March 2014 showed an LDL particle number at  737 ; total cholesterol 75 triglycerides 58 and HDL particle number low at 29.2. He is not having any anginal symptoms. He denies shortness of breath and continues to exercise regularly.  He underwent successful chemotherapy and radiation therapy for malignant tumor of the maxillary sinus which was diffuse large B-cell lymphoma.  He is now in complete remission.  During the time of chemotherapy, he had not been regulated taking his cholesterol medication.  He is now back taking regular atorvastatin 40 mg in addition to Zetia 10 mg N omega-3 fatty acid over-the-counter.  His most recent LDL had risen to 89.  I will recheck fasting lipid panel for reassessment and if he is still above the target of 70, medication adjustment with atorvastatin up to 80 mg will be done.  Presently, his blood pressure remained stable on metoprolol 50 mg in addition to ramipril 10 mg.  His ECG today, shows sinus bradycardia for which he is totally asymptomatic at 49 bpm.  He states he believes his heart rate at home typically is in the upper 50s or 60s.  I have suggested a slight reduction of his Toprol-XL dose and he will alternate 50 mg with 25 mg every other day, which would slightly reduce his average dose to 37.5 mg daily.  He is now been exercising regularly.  He developed a  right shin contusion after almost falling and hitting the edge of a chair.  This has improved but still has residual discomfort.  He has aortic sclerosis and mitral annular calcification, contributing to his cardiac murmur.  He continues to have normal LV function and his last nuclear study remained low risk.  As long as he remains stable, I will see him in one year for reevaluation.    Time spent: 25 minutes  Troy Sine, MD, [facility]  07/23/2017 6:49 AM

## 2017-07-22 NOTE — Patient Instructions (Signed)
Medication Instructions:  Alternate taking metoprolol succinate (Toprol XL) 50 mg (1 tablet) daily and 25 mg (1/2 tablet) daily  Labwork: Lipid panel TODAY  Follow-Up: Your physician wants you to follow-up in: 12 months with Dr. Claiborne Billings. You will receive a reminder letter in the mail two months in advance. If you don't receive a letter, please call our office to schedule the follow-up appointment.   Any Other Special Instructions Will Be Listed Below (If Applicable).     If you need a refill on your cardiac medications before your next appointment, please call your pharmacy.

## 2017-07-23 ENCOUNTER — Encounter: Payer: Self-pay | Admitting: Cardiovascular Disease

## 2017-07-29 DIAGNOSIS — Z923 Personal history of irradiation: Secondary | ICD-10-CM | POA: Diagnosis not present

## 2017-07-29 DIAGNOSIS — C8339 Diffuse large B-cell lymphoma, extranodal and solid organ sites: Secondary | ICD-10-CM | POA: Diagnosis not present

## 2017-07-29 DIAGNOSIS — Z79899 Other long term (current) drug therapy: Secondary | ICD-10-CM | POA: Diagnosis not present

## 2017-07-29 DIAGNOSIS — Z9221 Personal history of antineoplastic chemotherapy: Secondary | ICD-10-CM | POA: Diagnosis not present

## 2017-07-29 DIAGNOSIS — H532 Diplopia: Secondary | ICD-10-CM | POA: Diagnosis not present

## 2017-08-09 DIAGNOSIS — C8510 Unspecified B-cell lymphoma, unspecified site: Secondary | ICD-10-CM | POA: Diagnosis not present

## 2017-08-09 DIAGNOSIS — H518 Other specified disorders of binocular movement: Secondary | ICD-10-CM | POA: Diagnosis not present

## 2017-08-09 DIAGNOSIS — H532 Diplopia: Secondary | ICD-10-CM | POA: Diagnosis not present

## 2017-10-13 ENCOUNTER — Other Ambulatory Visit: Payer: Self-pay

## 2017-10-13 ENCOUNTER — Ambulatory Visit (INDEPENDENT_AMBULATORY_CARE_PROVIDER_SITE_OTHER): Payer: Medicare Other | Admitting: Family Medicine

## 2017-10-13 VITALS — BP 142/78 | HR 60 | Temp 97.7°F | Resp 16 | Wt 182.0 lb

## 2017-10-13 DIAGNOSIS — I2510 Atherosclerotic heart disease of native coronary artery without angina pectoris: Secondary | ICD-10-CM

## 2017-10-13 DIAGNOSIS — I10 Essential (primary) hypertension: Secondary | ICD-10-CM

## 2017-10-13 DIAGNOSIS — C8330 Diffuse large B-cell lymphoma, unspecified site: Secondary | ICD-10-CM

## 2017-10-13 DIAGNOSIS — C61 Malignant neoplasm of prostate: Secondary | ICD-10-CM | POA: Diagnosis not present

## 2017-10-13 NOTE — Progress Notes (Signed)
Elijah Peters  MRN: [id number] DOB: 03/12/1941  Subjective:  HPI   Patient is a 77 year old male who presents for follow up of chronic illness.  He was last seen for chronic visit on 04/14/17. He has had some visit with the PA for a traumatic hematoma of the right lower leg.  Hypertension-Patient does not check his blood pressure at home but states that when he is at other offices he has been getting reading a little lower than today. BP Readings from Last 3 Encounters:  10/13/17 (!) 142/78  07/22/17 116/74  07/12/17 124/66    Hyperlipidemia-Patient had this checked on 07/22/17 Lab Results  Component Value Date   CHOL 95 (L) 07/22/2017   HDL 34 (L) 07/22/2017   LDLCALC 42 07/22/2017   TRIG 94 07/22/2017   CHOLHDL 2.8 07/22/2017    GERD-Patient states this is under fair control without any medicine.  However when he does have trouble he uses TUMS or a Ranitidine.  He states he usually knows when it will happen based on food he eats.   CAD-Followed by Dr. Claiborne Billings    Patient Active Problem List   Diagnosis Date Noted  . CAD in native artery 03/01/2015  . Benign enlargement of prostate 03/01/2015  . Narrowing of intervertebral disc space 03/01/2015  . Contracture of palmar fascia ([place]uytren's) 03/01/2015  . Essential (primary) hypertension 03/01/2015  . Acid reflux 03/01/2015  . H/O acute myocardial infarction 03/01/2015  . CA of prostate ([place]) 03/01/2015  . Hypercholesterolemia without hypertriglyceridemia 03/01/2015  . Prostate CA ([place]) 05/24/2014  . CAD (coronary artery disease) 04/29/2013  . HTN (hypertension) 04/29/2013  . Hyperlipidemia with target LDL less than 70 04/29/2013  . Aortic valve sclerosis 04/29/2013    Past Medical History:  Diagnosis Date  . CAD (coronary artery disease)   . Cancer (HCC)    LYMPHOMA/ TUMOR LEFT SINUS  . Cataract   . GERD (gastroesophageal reflux disease)   . Heart murmur   . History of nuclear stress test 06/2012   normal  pattern of perfusion; low risk scan  . Hyperlipidemia   . Hypertension   . LVH (left ventricular hypertrophy)    mild, concentric  . Mild aortic sclerosis ([place])   . Myocardial infarction ([place]) 08/1996   non-Q-wave inferolateral   . Peyronie's disease   . Prostate cancer [facility])     Social History   Socioeconomic History  . Marital status: Married    Spouse name: Not on file  . Number of children: 4  . Years of education: Not on file  . Highest education level: Not on file  Social Needs  . Financial resource strain: Not on file  . Food insecurity - worry: Not on file  . Food insecurity - inability: Not on file  . Transportation needs - medical: Not on file  . Transportation needs - non-medical: Not on file  Occupational History  . Occupation: retired    Fish farm manager: [employer]  Tobacco Use  . Smoking status: Former Smoker    Years: 9.00    Types: Cigarettes    Last attempt to quit: 08/25/1963    Years since quitting: 54.1  . Smokeless tobacco: Never Used  Substance and Sexual Activity  . Alcohol use: Yes    Alcohol/week: 6.0 oz    Types: 7 Standard drinks or equivalent, 3 Glasses of wine per week  . Drug use: No  . Sexual activity: No  Other Topics Concern  . Not on  file  Social History Narrative  . Not on file    Outpatient Encounter Medications as of 10/13/2017  Medication Sig  . aspirin 81 MG tablet Take 81 mg by mouth daily.   Marland Kitchen atorvastatin (LIPITOR) 40 MG tablet Take 1 tablet (40 mg total) by mouth at bedtime.  . B COMPLEX VITAMINS PO Take 1 tablet by mouth daily as needed (energy).   . calcium elemental as carbonate (TUMS ULTRA 1000) 400 MG chewable tablet Chew 1,000 mg by mouth daily as needed for heartburn.  . Cod Liver Oil OIL Take by mouth.  . cyclobenzaprine (FLEXERIL) 10 MG tablet Take 1 tablet (10 mg total) by mouth daily as needed for muscle spasms.  Marland Kitchen ezetimibe (ZETIA) 10 MG tablet Take 1 tablet (10 mg total) by mouth at bedtime.  . fluticasone  (FLONASE) 50 MCG/ACT nasal spray Place into both nostrils daily.  Marland Kitchen HYDROcodone-acetaminophen (NORCO/VICODIN) 5-325 MG per tablet Take 1 tablet by mouth daily as needed for moderate pain.   . metoprolol succinate (TOPROL-XL) 50 MG 24 hr tablet Alternate taking 50 mg (1 tablet) daily and 25 mg (1/2 tablet) daily  . OLANZapine (ZYPREXA) 5 MG tablet Take 5 mg by mouth at bedtime.  . Omega-3 Fatty Acids (FISH OIL) 1000 MG CAPS Take 1 capsule by mouth daily.  Marland Kitchen OVER THE COUNTER MEDICATION Saline and baking soda mouth rinse, swish and spit 4 to 5 times daily  . Potassium 99 MG TABS Take 1 tablet by mouth daily as needed (cramps).   . ramipril (ALTACE) 10 MG capsule Take 1 capsule (10 mg total) by mouth daily.  . ranitidine (ZANTAC) 150 MG capsule Take 150 mg by mouth 2 (two) times daily as needed for heartburn.   . sodium chloride (OCEAN) 0.65 % SOLN nasal spray Place 1 spray into both nostrils as needed for congestion.   No facility-administered encounter medications on file as of 10/13/2017.     Allergies  Allergen Reactions  . Niacin And Related Itching    Review of Systems  Constitutional: Negative for fever and malaise/fatigue.  HENT: Negative.   Eyes: Negative.   Respiratory: Negative for cough, shortness of breath and wheezing.   Cardiovascular: Negative for chest pain, palpitations, orthopnea, claudication and leg swelling.  Gastrointestinal: Negative.   Skin: Negative.   Neurological: Negative for dizziness, weakness and headaches.  Endo/Heme/Allergies: Negative.   Psychiatric/Behavioral: Negative.     Objective:  BP (!) 142/78 (BP Location: Right Arm, Patient Position: Sitting, Cuff Size: Normal)   Pulse 60   Temp 97.7 F (36.5 C) (Oral)   Resp 16   Wt 182 lb (82.6 kg)   BMI 25.38 kg/m   Physical Exam  Constitutional: He is oriented to person, place, and time and well-developed, well-nourished, and in no distress.  HENT:  Head: Normocephalic and atraumatic.  Right Ear:  External ear normal.  Left Ear: External ear normal.  Nose: Nose normal.  Eyes: No scleral icterus.  Right lazy eye.  Neck: No thyromegaly present.  Cardiovascular: Normal rate, regular rhythm and normal heart sounds.  Pulmonary/Chest: Effort normal and breath sounds normal.  Abdominal: Soft.  Musculoskeletal:  Calcified nontender hematoma mid shin.  Neurological: He is alert and oriented to person, place, and time. Gait normal. GCS score is 15.  Skin: Skin is warm and dry.  Psychiatric: Mood, memory, affect and judgment normal.    Assessment and Plan :   CAD HTN HLD Prostate Cancer Lymphoma Strabismus Per ophthalmology.  I have done  the exam and reviewed the chart and it is accurate to the best of my knowledge. Development worker, community has been used and  any errors in dictation or transcription are unintentional. Miguel Aschoff M.D. [facility]

## 2017-10-18 DIAGNOSIS — C8510 Unspecified B-cell lymphoma, unspecified site: Secondary | ICD-10-CM | POA: Diagnosis not present

## 2017-10-18 DIAGNOSIS — H5000 Unspecified esotropia: Secondary | ICD-10-CM | POA: Diagnosis not present

## 2017-10-18 DIAGNOSIS — H518 Other specified disorders of binocular movement: Secondary | ICD-10-CM | POA: Diagnosis not present

## 2017-10-18 DIAGNOSIS — H532 Diplopia: Secondary | ICD-10-CM | POA: Diagnosis not present

## 2017-10-28 DIAGNOSIS — J329 Chronic sinusitis, unspecified: Secondary | ICD-10-CM | POA: Diagnosis not present

## 2017-10-28 DIAGNOSIS — H532 Diplopia: Secondary | ICD-10-CM | POA: Diagnosis not present

## 2017-10-28 DIAGNOSIS — C8339 Diffuse large B-cell lymphoma, extranodal and solid organ sites: Secondary | ICD-10-CM | POA: Diagnosis not present

## 2017-10-28 DIAGNOSIS — Z923 Personal history of irradiation: Secondary | ICD-10-CM | POA: Diagnosis not present

## 2017-11-26 DIAGNOSIS — H518 Other specified disorders of binocular movement: Secondary | ICD-10-CM | POA: Diagnosis not present

## 2017-11-26 DIAGNOSIS — H532 Diplopia: Secondary | ICD-10-CM | POA: Diagnosis not present

## 2017-11-26 DIAGNOSIS — H5000 Unspecified esotropia: Secondary | ICD-10-CM | POA: Diagnosis not present

## 2017-11-30 DIAGNOSIS — I2510 Atherosclerotic heart disease of native coronary artery without angina pectoris: Secondary | ICD-10-CM | POA: Diagnosis not present

## 2017-11-30 DIAGNOSIS — E785 Hyperlipidemia, unspecified: Secondary | ICD-10-CM | POA: Diagnosis not present

## 2017-11-30 DIAGNOSIS — K219 Gastro-esophageal reflux disease without esophagitis: Secondary | ICD-10-CM | POA: Diagnosis not present

## 2017-11-30 DIAGNOSIS — I10 Essential (primary) hypertension: Secondary | ICD-10-CM | POA: Diagnosis not present

## 2017-12-03 DIAGNOSIS — Z7982 Long term (current) use of aspirin: Secondary | ICD-10-CM | POA: Diagnosis not present

## 2017-12-03 DIAGNOSIS — Z87891 Personal history of nicotine dependence: Secondary | ICD-10-CM | POA: Diagnosis not present

## 2017-12-03 DIAGNOSIS — Z9842 Cataract extraction status, left eye: Secondary | ICD-10-CM | POA: Diagnosis not present

## 2017-12-03 DIAGNOSIS — Z923 Personal history of irradiation: Secondary | ICD-10-CM | POA: Diagnosis not present

## 2017-12-03 DIAGNOSIS — H532 Diplopia: Secondary | ICD-10-CM | POA: Diagnosis not present

## 2017-12-03 DIAGNOSIS — H518 Other specified disorders of binocular movement: Secondary | ICD-10-CM | POA: Diagnosis not present

## 2017-12-03 DIAGNOSIS — H5005 Alternating esotropia: Secondary | ICD-10-CM | POA: Diagnosis not present

## 2017-12-03 DIAGNOSIS — C8331 Diffuse large B-cell lymphoma, lymph nodes of head, face, and neck: Secondary | ICD-10-CM | POA: Diagnosis not present

## 2017-12-03 DIAGNOSIS — Z955 Presence of coronary angioplasty implant and graft: Secondary | ICD-10-CM | POA: Diagnosis not present

## 2017-12-03 DIAGNOSIS — Z79899 Other long term (current) drug therapy: Secondary | ICD-10-CM | POA: Diagnosis not present

## 2017-12-03 DIAGNOSIS — K219 Gastro-esophageal reflux disease without esophagitis: Secondary | ICD-10-CM | POA: Diagnosis not present

## 2017-12-03 DIAGNOSIS — Z79891 Long term (current) use of opiate analgesic: Secondary | ICD-10-CM | POA: Diagnosis not present

## 2017-12-03 DIAGNOSIS — Z8546 Personal history of malignant neoplasm of prostate: Secondary | ICD-10-CM | POA: Diagnosis not present

## 2017-12-03 DIAGNOSIS — Z9841 Cataract extraction status, right eye: Secondary | ICD-10-CM | POA: Diagnosis not present

## 2017-12-03 DIAGNOSIS — E7800 Pure hypercholesterolemia, unspecified: Secondary | ICD-10-CM | POA: Diagnosis not present

## 2017-12-03 DIAGNOSIS — I252 Old myocardial infarction: Secondary | ICD-10-CM | POA: Diagnosis not present

## 2017-12-03 DIAGNOSIS — I10 Essential (primary) hypertension: Secondary | ICD-10-CM | POA: Diagnosis not present

## 2017-12-03 DIAGNOSIS — Z961 Presence of intraocular lens: Secondary | ICD-10-CM | POA: Diagnosis not present

## 2017-12-03 DIAGNOSIS — I2510 Atherosclerotic heart disease of native coronary artery without angina pectoris: Secondary | ICD-10-CM | POA: Diagnosis not present

## 2017-12-03 DIAGNOSIS — Z9221 Personal history of antineoplastic chemotherapy: Secondary | ICD-10-CM | POA: Diagnosis not present

## 2017-12-29 ENCOUNTER — Encounter: Payer: Medicare Other | Admitting: Family Medicine

## 2018-01-04 IMAGING — NM NM MISC PROCEDURE
6 series · 36 of 36 positions shown · non-contrast
Comparison: none

[Series 1: wbr rest · 6.40mm/px · 6 of 64 frames shown]
[frame 6/64  full-range]
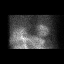
[frame 16/64  full-range]
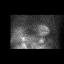
[frame 27/64  full-range]
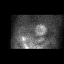
[frame 38/64  full-range]
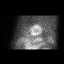
[frame 48/64  full-range]
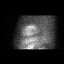
[frame 59/64  full-range]
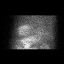

[Series 1: wbr_r-proj_st wbr rest · 6.40mm/px · 6 of 64 frames shown]
[frame 6/64]
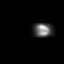
[frame 16/64]
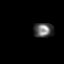
[frame 27/64]
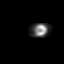
[frame 38/64]
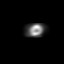
[frame 48/64]
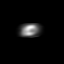
[frame 59/64]
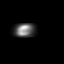

[Series 2: wbr_s-proj_st wbr stress-gsp · 6.40mm/px · 6 of 512 frames shown]
[frame 43/512]
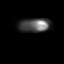
[frame 128/512]
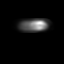
[frame 214/512]
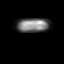
[frame 299/512]
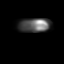
[frame 384/512]
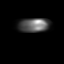
[frame 470/512]
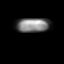

[Series 2: wbr stress-gsp · 6.40mm/px · 6 of 505 frames shown]
[frame 43/505  full-range]
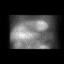
[frame 127/505  full-range]
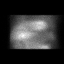
[frame 211/505  full-range]
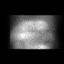
[frame 295/505  full-range]
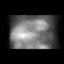
[frame 379/505  full-range]
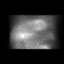
[frame 463/505  full-range]
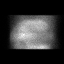

[Series 3: wbr_s-proj_st wbr stress-sum-em · 6.40mm/px · 6 of 64 frames shown]
[frame 6/64]
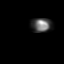
[frame 16/64]
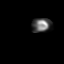
[frame 27/64]
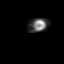
[frame 38/64]
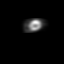
[frame 48/64]
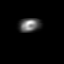
[frame 59/64]
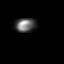

[Series 3: wbr stress-sum-em · 6.40mm/px · 6 of 64 frames shown]
[frame 6/64]
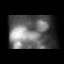
[frame 16/64]
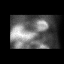
[frame 27/64]
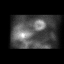
[frame 38/64]
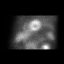
[frame 48/64]
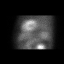
[frame 59/64]
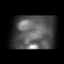

[36 of 36 positions shown; findings below may reference images not displayed]

Canned report from images found in remote index.

Refer to host system for actual result text.

## 2018-01-12 DIAGNOSIS — L578 Other skin changes due to chronic exposure to nonionizing radiation: Secondary | ICD-10-CM | POA: Diagnosis not present

## 2018-01-12 DIAGNOSIS — Z1283 Encounter for screening for malignant neoplasm of skin: Secondary | ICD-10-CM | POA: Diagnosis not present

## 2018-01-12 DIAGNOSIS — D1800 Hemangioma unspecified site: Secondary | ICD-10-CM | POA: Diagnosis not present

## 2018-01-12 DIAGNOSIS — Z85828 Personal history of other malignant neoplasm of skin: Secondary | ICD-10-CM | POA: Diagnosis not present

## 2018-01-12 DIAGNOSIS — L821 Other seborrheic keratosis: Secondary | ICD-10-CM | POA: Diagnosis not present

## 2018-01-12 DIAGNOSIS — D485 Neoplasm of uncertain behavior of skin: Secondary | ICD-10-CM | POA: Diagnosis not present

## 2018-01-12 DIAGNOSIS — I781 Nevus, non-neoplastic: Secondary | ICD-10-CM | POA: Diagnosis not present

## 2018-01-12 DIAGNOSIS — L820 Inflamed seborrheic keratosis: Secondary | ICD-10-CM | POA: Diagnosis not present

## 2018-01-12 DIAGNOSIS — I8393 Asymptomatic varicose veins of bilateral lower extremities: Secondary | ICD-10-CM | POA: Diagnosis not present

## 2018-01-12 DIAGNOSIS — L738 Other specified follicular disorders: Secondary | ICD-10-CM | POA: Diagnosis not present

## 2018-01-12 DIAGNOSIS — L570 Actinic keratosis: Secondary | ICD-10-CM

## 2018-01-12 HISTORY — DX: Actinic keratosis: L57.0

## 2018-01-26 DIAGNOSIS — C8330 Diffuse large B-cell lymphoma, unspecified site: Secondary | ICD-10-CM | POA: Diagnosis not present

## 2018-01-26 DIAGNOSIS — H4710 Unspecified papilledema: Secondary | ICD-10-CM | POA: Diagnosis not present

## 2018-01-27 DIAGNOSIS — Z888 Allergy status to other drugs, medicaments and biological substances status: Secondary | ICD-10-CM | POA: Diagnosis not present

## 2018-01-27 DIAGNOSIS — H532 Diplopia: Secondary | ICD-10-CM | POA: Diagnosis not present

## 2018-01-27 DIAGNOSIS — Z9289 Personal history of other medical treatment: Secondary | ICD-10-CM | POA: Diagnosis not present

## 2018-01-27 DIAGNOSIS — H4710 Unspecified papilledema: Secondary | ICD-10-CM | POA: Insufficient documentation

## 2018-01-27 DIAGNOSIS — C8339 Diffuse large B-cell lymphoma, extranodal and solid organ sites: Secondary | ICD-10-CM | POA: Diagnosis not present

## 2018-01-27 DIAGNOSIS — Z9221 Personal history of antineoplastic chemotherapy: Secondary | ICD-10-CM | POA: Diagnosis not present

## 2018-01-27 DIAGNOSIS — H47092 Other disorders of optic nerve, not elsewhere classified, left eye: Secondary | ICD-10-CM | POA: Diagnosis not present

## 2018-01-27 DIAGNOSIS — Z923 Personal history of irradiation: Secondary | ICD-10-CM | POA: Diagnosis not present

## 2018-01-27 DIAGNOSIS — M25561 Pain in right knee: Secondary | ICD-10-CM | POA: Diagnosis not present

## 2018-02-02 DIAGNOSIS — C8590 Non-Hodgkin lymphoma, unspecified, unspecified site: Secondary | ICD-10-CM | POA: Diagnosis not present

## 2018-02-02 DIAGNOSIS — H4710 Unspecified papilledema: Secondary | ICD-10-CM | POA: Diagnosis not present

## 2018-02-14 ENCOUNTER — Other Ambulatory Visit: Payer: Self-pay | Admitting: Cardiovascular Disease

## 2018-02-14 NOTE — Telephone Encounter (Signed)
Rx request sent to pharmacy.  

## 2018-03-08 DIAGNOSIS — H4710 Unspecified papilledema: Secondary | ICD-10-CM | POA: Diagnosis not present

## 2018-04-05 ENCOUNTER — Other Ambulatory Visit: Payer: Self-pay | Admitting: Cardiovascular Disease

## 2018-04-06 ENCOUNTER — Ambulatory Visit (INDEPENDENT_AMBULATORY_CARE_PROVIDER_SITE_OTHER): Payer: Medicare Other

## 2018-04-06 VITALS — BP 132/68 | HR 70 | Temp 98.8°F | Ht 71.0 in | Wt 184.4 lb

## 2018-04-06 DIAGNOSIS — Z0000 Encounter for general adult medical examination without abnormal findings: Secondary | ICD-10-CM

## 2018-04-06 NOTE — Patient Instructions (Addendum)
Elijah Peters , Thank you for taking time to come for your Medicare Wellness Visit. I appreciate your ongoing commitment to your health goals. Please review the following plan we discussed and let me know if I can assist you in the future.   Screening recommendations/referrals: Colonoscopy: Up to date Recommended yearly ophthalmology/optometry visit for glaucoma screening and checkup Recommended yearly dental visit for hygiene and checkup  Vaccinations: Influenza vaccine: Up to date Pneumococcal vaccine: Up to date Tdap vaccine: Up to date Shingles vaccine: Pt declines today.     Advanced directives: Already on file.   Conditions/risks identified: Fall risk prevention.   Next appointment: 04/12/18 @ 9 AM with Dr Rosanna Randy.   Preventive Care 75 Years and Older, Male Preventive care refers to lifestyle choices and visits with your health care provider that can promote health and wellness. What does preventive care include?  A yearly physical exam. This is also called an annual well check.  Dental exams once or twice a year.  Routine eye exams. Ask your health care provider how often you should have your eyes checked.  Personal lifestyle choices, including:  Daily care of your teeth and gums.  Regular physical activity.  Eating a healthy diet.  Avoiding tobacco and drug use.  Limiting alcohol use.  Practicing safe sex.  Taking low doses of aspirin every day.  Taking vitamin and mineral supplements as recommended by your health care provider. What happens during an annual well check? The services and screenings done by your health care provider during your annual well check will depend on your age, overall health, lifestyle risk factors, and family history of disease. Counseling  Your health care provider may ask you questions about your:  Alcohol use.  Tobacco use.  Drug use.  Emotional well-being.  Home and relationship well-being.  Sexual activity.  Eating  habits.  History of falls.  Memory and ability to understand (cognition).  Work and work Statistician. Screening  You may have the following tests or measurements:  Height, weight, and BMI.  Blood pressure.  Lipid and cholesterol levels. These may be checked every 5 years, or more frequently if you are over 45 years old.  Skin check.  Lung cancer screening. You may have this screening every year starting at age 41 if you have a 30-pack-year history of smoking and currently smoke or have quit within the past 15 years.  Fecal occult blood test (FOBT) of the stool. You may have this test every year starting at age 3.  Flexible sigmoidoscopy or colonoscopy. You may have a sigmoidoscopy every 5 years or a colonoscopy every 10 years starting at age 94.  Prostate cancer screening. Recommendations will vary depending on your family history and other risks.  Hepatitis C blood test.  Hepatitis B blood test.  Sexually transmitted disease (STD) testing.  Diabetes screening. This is done by checking your blood sugar (glucose) after you have not eaten for a while (fasting). You may have this done every 1-3 years.  Abdominal aortic aneurysm (AAA) screening. You may need this if you are a current or former smoker.  Osteoporosis. You may be screened starting at age 57 if you are at high risk. Talk with your health care provider about your test results, treatment options, and if necessary, the need for more tests. Vaccines  Your health care provider may recommend certain vaccines, such as:  Influenza vaccine. This is recommended every year.  Tetanus, diphtheria, and acellular pertussis (Tdap, Td) vaccine. You may need  a Td booster every 10 years.  Zoster vaccine. You may need this after age 15.  Pneumococcal 13-valent conjugate (PCV13) vaccine. One dose is recommended after age 6.  Pneumococcal polysaccharide (PPSV23) vaccine. One dose is recommended after age 50. Talk to your health  care provider about which screenings and vaccines you need and how often you need them. This information is not intended to replace advice given to you by your health care provider. Make sure you discuss any questions you have with your health care provider. Document Released: 09/06/2015 Document Revised: 04/29/2016 Document Reviewed: 06/11/2015 Elsevier Interactive Patient Education  2017 [place] Prevention in the Home Falls can cause injuries. They can happen to people of all ages. There are many things you can do to make your home safe and to help prevent falls. What can I do on the outside of my home?  Regularly fix the edges of walkways and driveways and fix any cracks.  Remove anything that might make you trip as you walk through a door, such as a raised step or threshold.  Trim any bushes or trees on the path to your home.  Use bright outdoor lighting.  Clear any walking paths of anything that might make someone trip, such as rocks or tools.  Regularly check to see if handrails are loose or broken. Make sure that both sides of any steps have handrails.  Any raised decks and porches should have guardrails on the edges.  Have any leaves, snow, or ice cleared regularly.  Use sand or salt on walking paths during winter.  Clean up any spills in your garage right away. This includes oil or grease spills. What can I do in the bathroom?  Use night lights.  Install grab bars by the toilet and in the tub and shower. Do not use towel bars as grab bars.  Use non-skid mats or decals in the tub or shower.  If you need to sit down in the shower, use a plastic, non-slip stool.  Keep the floor dry. Clean up any water that spills on the floor as soon as it happens.  Remove soap buildup in the tub or shower regularly.  Attach bath mats securely with double-sided non-slip rug tape.  Do not have throw rugs and other things on the floor that can make you trip. What can I do  in the bedroom?  Use night lights.  Make sure that you have a light by your bed that is easy to reach.  Do not use any sheets or blankets that are too big for your bed. They should not hang down onto the floor.  Have a firm chair that has side arms. You can use this for support while you get dressed.  Do not have throw rugs and other things on the floor that can make you trip. What can I do in the kitchen?  Clean up any spills right away.  Avoid walking on wet floors.  Keep items that you use a lot in easy-to-reach places.  If you need to reach something above you, use a strong step stool that has a grab bar.  Keep electrical cords out of the way.  Do not use floor polish or wax that makes floors slippery. If you must use wax, use non-skid floor wax.  Do not have throw rugs and other things on the floor that can make you trip. What can I do with my stairs?  Do not leave any items on the  stairs.  Make sure that there are handrails on both sides of the stairs and use them. Fix handrails that are broken or loose. Make sure that handrails are as long as the stairways.  Check any carpeting to make sure that it is firmly attached to the stairs. Fix any carpet that is loose or worn.  Avoid having throw rugs at the top or bottom of the stairs. If you do have throw rugs, attach them to the floor with carpet tape.  Make sure that you have a light switch at the top of the stairs and the bottom of the stairs. If you do not have them, ask someone to add them for you. What else can I do to help prevent falls?  Wear shoes that:  Do not have high heels.  Have rubber bottoms.  Are comfortable and fit you well.  Are closed at the toe. Do not wear sandals.  If you use a stepladder:  Make sure that it is fully opened. Do not climb a closed stepladder.  Make sure that both sides of the stepladder are locked into place.  Ask someone to hold it for you, if possible.  Clearly mark  and make sure that you can see:  Any grab bars or handrails.  First and last steps.  Where the edge of each step is.  Use tools that help you move around (mobility aids) if they are needed. These include:  Canes.  Walkers.  Scooters.  Crutches.  Turn on the lights when you go into a dark area. Replace any light bulbs as soon as they burn out.  Set up your furniture so you have a clear path. Avoid moving your furniture around.  If any of your floors are uneven, fix them.  If there are any pets around you, be aware of where they are.  Review your medicines with your doctor. Some medicines can make you feel dizzy. This can increase your chance of falling. Ask your doctor what other things that you can do to help prevent falls. This information is not intended to replace advice given to you by your health care provider. Make sure you discuss any questions you have with your health care provider. Document Released: 06/06/2009 Document Revised: 01/16/2016 Document Reviewed: 09/14/2014 Elsevier Interactive Patient Education  2017 [employer].

## 2018-04-06 NOTE — Progress Notes (Signed)
Subjective:   Elijah Peters is a 77 y.o. male who presents for Medicare Annual/Subsequent preventive examination.  Review of Systems:  N/A  Cardiac Risk Factors include: advanced age (>18men, >43 women);male gender;dyslipidemia;hypertension     Objective:    Vitals: BP 132/68 (BP Location: Right Arm)   Pulse 70   Temp 98.8 F (37.1 C) (Oral)   Ht 5\' 11"  (1.803 m)   Wt 184 lb 6.4 oz (83.6 kg)   BMI 25.72 kg/m   Body mass index is 25.72 kg/m.  Advanced Directives 04/06/2018 09/23/2016 09/17/2016  Does Patient Have a Medical Advance Directive? Yes Yes Yes  Type of Paramedic of [place];Living will [place];Living will [place];Living will  Does patient want to make changes to medical advance directive? - No - Patient declined -  Copy of [place] in Chart? Yes No - copy requested No - copy requested    Tobacco Social History   Tobacco Use  Smoking Status Former Smoker  . Years: 9.00  . Types: Cigarettes  . Last attempt to quit: 08/25/1963  . Years since quitting: 54.6  Smokeless Tobacco Never Used     Counseling given: Not Answered   Clinical Intake:  Pre-visit preparation completed: Yes  Pain : No/denies pain Pain Score: 0-No pain     Nutritional Status: BMI 25 -29 Overweight Nutritional Risks: Other (Comment) Diabetes: No  How often do you need to have someone help you when you read instructions, pamphlets, or other written materials from your doctor or pharmacy?: 1 - Never  Interpreter Needed?: No  Information entered by :: [facility], LPN  Past Medical History:  Diagnosis Date  . CAD (coronary artery disease)   . Cancer (HCC)    LYMPHOMA/ TUMOR LEFT SINUS  . Cataract   . GERD (gastroesophageal reflux disease)   . Heart murmur   . History of nuclear stress test 06/2012   normal pattern of perfusion; low risk scan  . Hyperlipidemia   . Hypertension   . LVH  (left ventricular hypertrophy)    mild, concentric  . Mild aortic sclerosis ([place])   . Myocardial infarction ([place]) 08/1996   non-Q-wave inferolateral   . Non Hodgkin's lymphoma ([place])   . Peyronie's disease   . Prostate cancer [facility])    Past Surgical History:  Procedure Laterality Date  . CARDIAC CATHETERIZATION  1992   PTCA to LAD, in [place]  . CATARACT EXTRACTION  08/06/2010   right eye  . CATARACT EXTRACTION W/PHACO Left 09/23/2016   Procedure: CATARACT EXTRACTION PHACO AND INTRAOCULAR LENS PLACEMENT (IOC);  Surgeon: Estill Cotta, MD;  Location: [facility] ORS;  Service: Ophthalmology;  Laterality: Left;  [place] 1:38.5 AP% 25.3 CDE 48.16 Fluid pack lot # [id number]  . CORONARY ANGIOPLASTY WITH STENT PLACEMENT  1998   L circumflex - 3.0x23.9x9 bare metal stent  . EYE SURGERY  2011   Cataracts removed.  Marland Kitchen HEMORROIDECTOMY    . HERNIA REPAIR  2004  . lymphoma removed    . PROSTATE SURGERY  03/2009   seed implant due to prostate cancer  . TONSILLECTOMY  childhood  . TRANSTHORACIC ECHOCARDIOGRAM  09/01/2011   EF=>55%; mild conc LVH; borderline RV enlargement; LA mildly dilated; mild mitral annular calcif; mild-mod MR; RV systolic pressure elevated; AV mildly sclerotic; mild AV regurg; aortic root sclerosis/calcif   Family History  Problem Relation Age of Onset  . Emphysema Father   . COPD Father   .  Stroke Mother   . Hypertension Brother   . Hyperlipidemia Brother   . Heart disease Brother        CAD   Social History   Socioeconomic History  . Marital status: Married    Spouse name: Not on file  . Number of children: 4  . Years of education: Not on file  . Highest education level: Bachelor's degree (e.g., BA, AB, BS)  Occupational History  . Occupation: retired    Fish farm manager: [employer]  . Financial resource strain: Not hard at all  . Food insecurity:    Worry: Never true    Inability: Never true  . Transportation needs:    Medical: No    Non-medical: No    Tobacco Use  . Smoking status: Former Smoker    Years: 9.00    Types: Cigarettes    Last attempt to quit: 08/25/1963    Years since quitting: 54.6  . Smokeless tobacco: Never Used  Substance and Sexual Activity  . Alcohol use: Yes    Alcohol/week: 2.0 - 3.0 standard drinks    Types: 2 - 3 Cans of beer per week  . Drug use: No  . Sexual activity: Never  Lifestyle  . Physical activity:    Days per week: Not on file    Minutes per session: Not on file  . Stress: Not at all  Relationships  . Social connections:    Talks on phone: Not on file    Gets together: Not on file    Attends religious service: Not on file    Active member of club or organization: Not on file    Attends meetings of clubs or organizations: Not on file    Relationship status: Not on file  Other Topics Concern  . Not on file  Social History Narrative  . Not on file    Outpatient Encounter Medications as of 04/06/2018  Medication Sig  . aspirin 81 MG tablet Take 81 mg by mouth daily.   Marland Kitchen atorvastatin (LIPITOR) 40 MG tablet Take 1 tablet (40 mg total) by mouth at bedtime.  . B COMPLEX VITAMINS PO Take 1 tablet by mouth daily as needed (energy).   . calcium elemental as carbonate (TUMS ULTRA 1000) 400 MG chewable tablet Chew 1,000 mg by mouth daily as needed for heartburn.  . co-enzyme Q-10 50 MG capsule Take 200 mg by mouth daily.   . Cod Liver Oil OIL Take by mouth.  . cyclobenzaprine (FLEXERIL) 10 MG tablet Take 1 tablet (10 mg total) by mouth daily as needed for muscle spasms.  Marland Kitchen ezetimibe (ZETIA) 10 MG tablet Take 1 tablet (10 mg total) by mouth at bedtime.  Marland Kitchen HYDROcodone-acetaminophen (NORCO/VICODIN) 5-325 MG per tablet Take 1 tablet by mouth daily as needed for moderate pain.   . metoprolol succinate (TOPROL-XL) 50 MG 24 hr tablet Alternate taking 50 mg (1 tablet) daily and 25 mg (1/2 tablet) daily (Patient taking differently: Take 25 mg by mouth every other day. Alternate taking 50 mg (1 tablet) daily  and 25 mg (1/2 tablet) daily)  . metoprolol succinate (TOPROL-XL) 50 MG 24 hr tablet TAKE 1 TABLET DAILY (Patient taking differently: Take 50 mg by mouth every other day. TAKE 1 TABLET DAILY)  . Omega-3 Fatty Acids (FISH OIL) 1000 MG CAPS Take 1 capsule by mouth daily.  Marland Kitchen OVER THE COUNTER MEDICATION Saline and baking soda mouth rinse, swish and spit 4 to 5 times daily  . Potassium  99 MG TABS Take 1 tablet by mouth daily as needed (cramps).   . ramipril (ALTACE) 10 MG capsule TAKE 1 CAPSULE DAILY  . ranitidine (ZANTAC) 150 MG capsule Take 150 mg by mouth 2 (two) times daily as needed for heartburn.   . fluticasone (FLONASE) 50 MCG/ACT nasal spray Place into both nostrils daily.  Marland Kitchen OLANZapine (ZYPREXA) 5 MG tablet Take 5 mg by mouth at bedtime.  . sodium chloride (OCEAN) 0.65 % SOLN nasal spray Place 1 spray into both nostrils as needed for congestion.   No facility-administered encounter medications on file as of 04/06/2018.     Activities of Daily Living In your present state of health, do you have any difficulty performing the following activities: 04/06/2018  Hearing? N  Vision? Y  Comment Due to double vision- following up with an eye dr.   Difficulty concentrating or making decisions? N  Walking or climbing stairs? N  Dressing or bathing? N  Doing errands, shopping? N  Preparing Food and eating ? N  Using the Toilet? N  In the past six months, have you accidently leaked urine? N  Do you have problems with loss of bowel control? N  Managing your Medications? N  Managing your Finances? N  Housekeeping or managing your Housekeeping? N  Some recent data might be hidden    Patient Care Team: Jerrol Banana., MD as PCP - General (Family Medicine) Dingeldein, Remo Lipps, MD as Consulting Physician (Ophthalmology) Myrlene Broker, MD as Attending Physician (Urology) Troy Sine, MD as Consulting Physician (Cardiology) Corey Harold, MD as Referring Physician (Internal  Medicine) Perry Mount, MD (Ophthalmology)   Assessment:   This is a routine wellness examination for [place].  Exercise Activities and Dietary recommendations Current Exercise Habits: Home exercise routine, Type of exercise: walking, Time (Minutes): 45, Frequency (Times/Week): 6, Weekly Exercise (Minutes/Week): 270, Intensity: Mild, Exercise limited by: None identified  Goals    . Increase water intake     Starting 09/17/16, I will increase my water intake to 4-5 glasses a day.       Fall Risk Fall Risk  04/06/2018 10/13/2017 09/17/2016 10/09/2015 04/01/2015  Falls in the past year? Yes Yes Yes No No  Number falls in past yr: 1 1 1  - -  Injury with Fall? No Yes No - -  Comment - Hematoma after falling when a chair slipped out from under him. - - -  Follow up Falls prevention discussed Falls prevention discussed Falls prevention discussed - -   Is the patient's home free of loose throw rugs in walkways, pet beds, electrical cords, etc?   yes      Grab bars in the bathroom? no      Handrails on the stairs?   yes      Adequate lighting?   yes  Timed Get Up and Go Performed: N/A  Depression Screen PHQ 2/9 Scores 04/06/2018 10/13/2017 09/17/2016 10/09/2015  PHQ - 2 Score 0 0 0 0    Cognitive Function: Pt declined screening today.      6CIT Screen 09/17/2016  What Year? 0 points  What month? 0 points  What time? 0 points  Count back from 20 0 points  Months in reverse 0 points  Repeat phrase 2 points  Total Score 2    Immunization History  Administered Date(s) Administered  . Influenza, High Dose Seasonal PF 04/14/2017  . Influenza-Unspecified 05/24/2013, 05/30/2015  . Pneumococcal Conjugate-13 09/20/2014  . Pneumococcal Polysaccharide-23 07/01/2011  .  Tdap 12/27/2014  . Zoster 09/27/2012    Qualifies for Shingles Vaccine? Due for Shingles vaccine. Declined my offer to administer today. Education has been provided regarding the importance of this vaccine. Pt  has been advised to call her insurance company to determine her out of pocket expense. Advised she may also receive this vaccine at her local pharmacy or Health Dept. Verbalized acceptance and understanding.  Screening Tests Health Maintenance  Topic Date Due  . INFLUENZA VACCINE  03/24/2018  . TETANUS/TDAP  12/26/2024  . PNA vac Low Risk Adult  Completed   Cancer Screenings: Lung: Low Dose CT Chest recommended if Age 63-80 years, 30 pack-year currently smoking OR have quit w/in 15years. Patient does not qualify. Colorectal: Up to date  Additional Screenings:  Hepatitis C Screening: N/A      Plan:  I have personally reviewed and addressed the Medicare Annual Wellness questionnaire and have noted the following in the patient's chart:  A. Medical and social history B. Use of alcohol, tobacco or illicit drugs  C. Current medications and supplements D. Functional ability and status E.  Nutritional status F.  Physical activity G. Advance directives H. List of other physicians I.  Hospitalizations, surgeries, and ER visits in previous 12 months J.  [place] such as hearing and vision if needed, cognitive and depression L. Referrals and appointments - none  In addition, I have reviewed and discussed with patient certain preventive protocols, quality metrics, and best practice recommendations. A written personalized care plan for preventive services as well as general preventive health recommendations were provided to patient.  See attached scanned questionnaire for additional information.   Signed,  Fabio Neighbors, LPN Nurse Health Advisor   Nurse Recommendations: None.

## 2018-04-12 ENCOUNTER — Ambulatory Visit (INDEPENDENT_AMBULATORY_CARE_PROVIDER_SITE_OTHER): Payer: Medicare Other | Admitting: Family Medicine

## 2018-04-12 VITALS — BP 172/72 | HR 70 | Temp 98.0°F | Resp 16 | Wt 183.0 lb

## 2018-04-12 DIAGNOSIS — I10 Essential (primary) hypertension: Secondary | ICD-10-CM | POA: Diagnosis not present

## 2018-04-12 DIAGNOSIS — I2510 Atherosclerotic heart disease of native coronary artery without angina pectoris: Secondary | ICD-10-CM

## 2018-04-12 DIAGNOSIS — C61 Malignant neoplasm of prostate: Secondary | ICD-10-CM

## 2018-04-12 DIAGNOSIS — C8330 Diffuse large B-cell lymphoma, unspecified site: Secondary | ICD-10-CM

## 2018-04-12 DIAGNOSIS — E7800 Pure hypercholesterolemia, unspecified: Secondary | ICD-10-CM

## 2018-04-12 NOTE — Progress Notes (Signed)
Elijah Peters  MRN: [id number] DOB: 21-Sep-1940  Subjective:  HPI   The patient is a 77 year old male who presents for follow up of chronic health.  He was last seen by the nurse health advisor on 04/06/18.  Patient Active Problem List   Diagnosis Date Noted  . CAD in native artery 03/01/2015  . Benign enlargement of prostate 03/01/2015  . Narrowing of intervertebral disc space 03/01/2015  . Contracture of palmar fascia (Dupuytren's) 03/01/2015  . Essential (primary) hypertension 03/01/2015  . Acid reflux 03/01/2015  . H/O acute myocardial infarction 03/01/2015  . CA of prostate ([place]) 03/01/2015  . Hypercholesterolemia without hypertriglyceridemia 03/01/2015  . Prostate CA ([place]) 05/24/2014  . CAD (coronary artery disease) 04/29/2013  . HTN (hypertension) 04/29/2013  . Hyperlipidemia with target LDL less than 70 04/29/2013  . Aortic valve sclerosis 04/29/2013    Past Medical History:  Diagnosis Date  . CAD (coronary artery disease)   . Cancer (HCC)    LYMPHOMA/ TUMOR LEFT SINUS  . Cataract   . GERD (gastroesophageal reflux disease)   . Heart murmur   . History of nuclear stress test 06/2012   normal pattern of perfusion; low risk scan  . Hyperlipidemia   . Hypertension   . LVH (left ventricular hypertrophy)    mild, concentric  . Mild aortic sclerosis ([place])   . Myocardial infarction ([place]) 08/1996   non-Q-wave inferolateral   . Non Hodgkin's lymphoma ([place])   . Peyronie's disease   . Prostate cancer [facility])     Social History   Socioeconomic History  . Marital status: Married    Spouse name: Not on file  . Number of children: 4  . Years of education: Not on file  . Highest education level: Bachelor's degree (e.g., BA, AB, BS)  Occupational History  . Occupation: retired    Fish farm manager: [employer]  . Financial resource strain: Not hard at all  . Food insecurity:    Worry: Never true    Inability: Never true  . Transportation needs:   Medical: No    Non-medical: No  Tobacco Use  . Smoking status: Former Smoker    Years: 9.00    Types: Cigarettes    Last attempt to quit: 08/25/1963    Years since quitting: 54.6  . Smokeless tobacco: Never Used  Substance and Sexual Activity  . Alcohol use: Yes    Alcohol/week: 2.0 - 3.0 standard drinks    Types: 2 - 3 Cans of beer per week  . Drug use: No  . Sexual activity: Never  Lifestyle  . Physical activity:    Days per week: Not on file    Minutes per session: Not on file  . Stress: Not at all  Relationships  . Social connections:    Talks on phone: Not on file    Gets together: Not on file    Attends religious service: Not on file    Active member of club or organization: Not on file    Attends meetings of clubs or organizations: Not on file    Relationship status: Not on file  . Intimate partner violence:    Fear of current or ex partner: Not on file    Emotionally abused: Not on file    Physically abused: Not on file    Forced sexual activity: Not on file  Other Topics Concern  . Not on file  Social History Narrative  . Not on file  Outpatient Encounter Medications as of 04/12/2018  Medication Sig  . aspirin 81 MG tablet Take 81 mg by mouth daily.   Marland Kitchen atorvastatin (LIPITOR) 40 MG tablet Take 1 tablet (40 mg total) by mouth at bedtime.  . B COMPLEX VITAMINS PO Take 1 tablet by mouth daily as needed (energy).   . calcium elemental as carbonate (TUMS ULTRA 1000) 400 MG chewable tablet Chew 1,000 mg by mouth daily as needed for heartburn.  . co-enzyme Q-10 50 MG capsule Take 200 mg by mouth daily.   . Cod Liver Oil OIL Take by mouth.  . cyclobenzaprine (FLEXERIL) 10 MG tablet Take 1 tablet (10 mg total) by mouth daily as needed for muscle spasms.  Marland Kitchen ezetimibe (ZETIA) 10 MG tablet Take 1 tablet (10 mg total) by mouth at bedtime.  . fluticasone (FLONASE) 50 MCG/ACT nasal spray Place into both nostrils daily.  Marland Kitchen HYDROcodone-acetaminophen (NORCO/VICODIN) 5-325 MG  per tablet Take 1 tablet by mouth daily as needed for moderate pain.   . metoprolol succinate (TOPROL-XL) 50 MG 24 hr tablet Alternate taking 50 mg (1 tablet) daily and 25 mg (1/2 tablet) daily (Patient taking differently: Take 25 mg by mouth every other day. Alternate taking 50 mg (1 tablet) daily and 25 mg (1/2 tablet) daily)  . metoprolol succinate (TOPROL-XL) 50 MG 24 hr tablet TAKE 1 TABLET DAILY (Patient taking differently: Take 50 mg by mouth every other day. TAKE 1 TABLET DAILY)  . OLANZapine (ZYPREXA) 5 MG tablet Take 5 mg by mouth at bedtime.  . Omega-3 Fatty Acids (FISH OIL) 1000 MG CAPS Take 1 capsule by mouth daily.  Marland Kitchen OVER THE COUNTER MEDICATION Saline and baking soda mouth rinse, swish and spit 4 to 5 times daily  . Potassium 99 MG TABS Take 1 tablet by mouth daily as needed (cramps).   . ramipril (ALTACE) 10 MG capsule TAKE 1 CAPSULE DAILY  . ranitidine (ZANTAC) 150 MG capsule Take 150 mg by mouth 2 (two) times daily as needed for heartburn.   . sodium chloride (OCEAN) 0.65 % SOLN nasal spray Place 1 spray into both nostrils as needed for congestion.   No facility-administered encounter medications on file as of 04/12/2018.     Allergies  Allergen Reactions  . Niacin And Related Itching  . Niacin Itching    Review of Systems  Constitutional: Negative.   HENT: Negative.   Eyes: Negative.        Visual disturbance  Respiratory: Negative.   Cardiovascular: Negative.   Gastrointestinal: Negative.   Genitourinary: Negative.   Musculoskeletal: Positive for myalgias.  Skin: Negative.   Neurological: Negative.   Endo/Heme/Allergies: Bruises/bleeds easily.  Psychiatric/Behavioral: Negative.     Objective:  BP (!) 172/72 (BP Location: Right Arm, Patient Position: Sitting, Cuff Size: Normal)   Pulse 70   Temp 98 F (36.7 C) (Oral)   Resp 16   Wt 183 lb (83 kg)   BMI 25.52 kg/m   Physical Exam  Constitutional: He is oriented to person, place, and time and  well-developed, well-nourished, and in no distress.  HENT:  Head: Normocephalic and atraumatic.  Right Ear: External ear normal.  Left Ear: External ear normal.  Cardiovascular: Normal rate, regular rhythm and normal heart sounds.  Pulmonary/Chest: Effort normal and breath sounds normal.  Abdominal: Soft.  Musculoskeletal: He exhibits no edema.  Neurological: He is alert and oriented to person, place, and time. Gait normal. GCS score is 15.  Skin: Skin is warm and dry.  Psychiatric:  Mood, memory, affect and judgment normal.    Assessment and Plan :  1. Essential (primary) hypertension  - CBC with Differential/Platelet - Comprehensive metabolic panel - TSH  2. Hypercholesterolemia without hypertriglyceridemia  - Lipid Panel With LDL/HDL Ratio 3.Large Bcell Lymphoma 4.CAD Risk factors treated. 5.Prostate Cancer Per urology.  I have done the exam and reviewed the chart and it is accurate to the best of my knowledge. Development worker, community has been used and  any errors in dictation or transcription are unintentional. Miguel Aschoff M.D. [facility]

## 2018-04-19 DIAGNOSIS — H4710 Unspecified papilledema: Secondary | ICD-10-CM | POA: Diagnosis not present

## 2018-04-21 DIAGNOSIS — I10 Essential (primary) hypertension: Secondary | ICD-10-CM | POA: Diagnosis not present

## 2018-04-21 DIAGNOSIS — E7800 Pure hypercholesterolemia, unspecified: Secondary | ICD-10-CM | POA: Diagnosis not present

## 2018-04-22 LAB — COMPREHENSIVE METABOLIC PANEL
A/G RATIO: 1.9 (ref 1.2–2.2)
ALK PHOS: 86 IU/L (ref 39–117)
ALT: 20 IU/L (ref 0–44)
AST: 20 IU/L (ref 0–40)
Albumin: 4.1 g/dL (ref 3.5–4.8)
BUN/Creatinine Ratio: 15 (ref 10–24)
BUN: 14 mg/dL (ref 8–27)
Bilirubin Total: 0.8 mg/dL (ref 0.0–1.2)
CALCIUM: 9.4 mg/dL (ref 8.6–10.2)
CO2: 22 mmol/L (ref 20–29)
Chloride: 105 mmol/L (ref 96–106)
Creatinine, Ser: 0.93 mg/dL (ref 0.76–1.27)
GFR calc Af Amer: 90 mL/min/{1.73_m2} (ref >59)
GFR, EST NON AFRICAN AMERICAN: 78 mL/min/{1.73_m2} (ref >59)
Globulin, Total: 2.2 g/dL (ref 1.5–4.5)
Glucose: 97 mg/dL (ref 65–99)
POTASSIUM: 4.5 mmol/L (ref 3.5–5.2)
Sodium: 142 mmol/L (ref 134–144)
Total Protein: 6.3 g/dL (ref 6.0–8.5)

## 2018-04-22 LAB — CBC WITH DIFFERENTIAL/PLATELET
Basophils Absolute: 0 10*3/uL (ref 0.0–0.2)
Basos: 1 %
EOS (ABSOLUTE): 0.1 10*3/uL (ref 0.0–0.4)
Eos: 1 %
Hematocrit: 42.3 % (ref 37.5–51.0)
Hemoglobin: 14.6 g/dL (ref 13.0–17.7)
IMMATURE GRANULOCYTES: 0 %
Immature Grans (Abs): 0 10*3/uL (ref 0.0–0.1)
Lymphocytes Absolute: 2 10*3/uL (ref 0.7–3.1)
Lymphs: 42 %
MCH: 33.1 pg — ABNORMAL HIGH (ref 26.6–33.0)
MCHC: 34.5 g/dL (ref 31.5–35.7)
MCV: 96 fL (ref 79–97)
MONOS ABS: 0.5 10*3/uL (ref 0.1–0.9)
Monocytes: 9 %
NEUTROS PCT: 47 %
Neutrophils Absolute: 2.2 10*3/uL (ref 1.4–7.0)
PLATELETS: 153 10*3/uL (ref 150–450)
RBC: 4.41 x10E6/uL (ref 4.14–5.80)
RDW: 12.2 % — AB (ref 12.3–15.4)
WBC: 4.8 10*3/uL (ref 3.4–10.8)

## 2018-04-22 LAB — LIPID PANEL WITH LDL/HDL RATIO
CHOLESTEROL TOTAL: 138 mg/dL (ref 100–199)
HDL: 30 mg/dL — AB (ref >39)
LDL Calculated: 75 mg/dL (ref 0–99)
LDL/HDL RATIO: 2.5 ratio (ref 0.0–3.6)
Triglycerides: 163 mg/dL — ABNORMAL HIGH (ref 0–149)
VLDL Cholesterol Cal: 33 mg/dL (ref 5–40)

## 2018-04-22 LAB — TSH: TSH: 1.22 u[IU]/mL (ref 0.450–4.500)

## 2018-04-27 ENCOUNTER — Telehealth: Payer: Self-pay

## 2018-04-27 NOTE — Telephone Encounter (Signed)
-----   Message from Jerrol Banana., MD sent at 04/27/2018  8:21 AM EDT ----- Labs stable--cholesterol ok but up a little--work on habits.

## 2018-04-27 NOTE — Telephone Encounter (Signed)
LMTCB

## 2018-04-28 NOTE — Telephone Encounter (Signed)
Patient advised as below. Patient verbalizes understanding and is in agreement with treatment plan.  

## 2018-05-19 DIAGNOSIS — Z9221 Personal history of antineoplastic chemotherapy: Secondary | ICD-10-CM | POA: Diagnosis not present

## 2018-05-19 DIAGNOSIS — C8339 Diffuse large B-cell lymphoma, extranodal and solid organ sites: Secondary | ICD-10-CM | POA: Diagnosis not present

## 2018-05-19 DIAGNOSIS — Z923 Personal history of irradiation: Secondary | ICD-10-CM | POA: Diagnosis not present

## 2018-05-19 DIAGNOSIS — H532 Diplopia: Secondary | ICD-10-CM | POA: Diagnosis not present

## 2018-05-30 DIAGNOSIS — C61 Malignant neoplasm of prostate: Secondary | ICD-10-CM | POA: Diagnosis not present

## 2018-06-08 ENCOUNTER — Ambulatory Visit: Payer: Medicare Other

## 2018-06-17 DIAGNOSIS — Z961 Presence of intraocular lens: Secondary | ICD-10-CM | POA: Diagnosis not present

## 2018-06-17 DIAGNOSIS — H5000 Unspecified esotropia: Secondary | ICD-10-CM | POA: Diagnosis not present

## 2018-06-17 DIAGNOSIS — C6992 Malignant neoplasm of unspecified site of left eye: Secondary | ICD-10-CM | POA: Diagnosis not present

## 2018-06-17 DIAGNOSIS — H532 Diplopia: Secondary | ICD-10-CM | POA: Diagnosis not present

## 2018-06-25 ENCOUNTER — Ambulatory Visit (INDEPENDENT_AMBULATORY_CARE_PROVIDER_SITE_OTHER): Payer: Medicare Other

## 2018-06-25 DIAGNOSIS — Z23 Encounter for immunization: Secondary | ICD-10-CM | POA: Diagnosis not present

## 2018-07-05 DIAGNOSIS — H4710 Unspecified papilledema: Secondary | ICD-10-CM | POA: Diagnosis not present

## 2018-07-10 DIAGNOSIS — D099 Carcinoma in situ, unspecified: Secondary | ICD-10-CM

## 2018-07-10 HISTORY — DX: Carcinoma in situ, unspecified: D09.9

## 2018-10-12 ENCOUNTER — Encounter: Payer: Self-pay | Admitting: Family Medicine

## 2018-10-12 ENCOUNTER — Ambulatory Visit (INDEPENDENT_AMBULATORY_CARE_PROVIDER_SITE_OTHER): Payer: Medicare Other | Admitting: Family Medicine

## 2018-10-12 VITALS — BP 126/68 | HR 60 | Temp 97.9°F | Resp 16 | Ht 71.0 in | Wt 185.0 lb

## 2018-10-12 DIAGNOSIS — I358 Other nonrheumatic aortic valve disorders: Secondary | ICD-10-CM

## 2018-10-12 DIAGNOSIS — I10 Essential (primary) hypertension: Secondary | ICD-10-CM | POA: Diagnosis not present

## 2018-10-12 DIAGNOSIS — I2510 Atherosclerotic heart disease of native coronary artery without angina pectoris: Secondary | ICD-10-CM

## 2018-10-12 DIAGNOSIS — E785 Hyperlipidemia, unspecified: Secondary | ICD-10-CM | POA: Diagnosis not present

## 2018-10-12 NOTE — Progress Notes (Signed)
Patient: Elijah Peters Male    DOB: 12/10/1940   78 y.o.   MRN: [id number] Visit Date: 10/12/2018  Today's Provider: [name], MD   Chief Complaint  Patient presents with  . Hypertension   Subjective:     HPI   Hypertension, follow-up:  BP Readings from Last 3 Encounters:  10/12/18 126/68  04/12/18 (!) 172/72  04/06/18 132/68    He was last seen for hypertension 6 months ago.  BP at that visit was 172/72. Management since that visit includes no changes. He reports good compliance with treatment. He is not having side effects.  He is exercising. He is adherent to low salt diet.   Outside blood pressures are not being checked. He is experiencing none.  Patient denies exertional chest pressure/discomfort, lower extremity edema and palpitations.   Cardiovascular risk factors include dyslipidemia.   Weight trend: stable Wt Readings from Last 3 Encounters:  10/12/18 185 lb (83.9 kg)  04/12/18 183 lb (83 kg)  04/06/18 184 lb 6.4 oz (83.6 kg)    Current diet: well balanced   No chest pain, shortness of breath, any cardiac symptoms.   Allergies  Allergen Reactions  . Niacin And Related Itching  . Niacin Itching     Current Outpatient Medications:  .  aspirin 81 MG tablet, Take 81 mg by mouth daily. , Disp: , Rfl:  .  atorvastatin (LIPITOR) 40 MG tablet, Take 1 tablet (40 mg total) by mouth at bedtime., Disp: 90 tablet, Rfl: 3 .  B COMPLEX VITAMINS PO, Take 1 tablet by mouth daily as needed (energy). , Disp: , Rfl:  .  calcium elemental as carbonate (TUMS ULTRA 1000) 400 MG chewable tablet, Chew 1,000 mg by mouth daily as needed for heartburn., Disp: , Rfl:  .  co-enzyme Q-10 50 MG capsule, Take 200 mg by mouth daily. , Disp: , Rfl:  .  Cod Liver Oil OIL, Take by mouth., Disp: , Rfl:  .  cyclobenzaprine (FLEXERIL) 10 MG tablet, Take 1 tablet (10 mg total) by mouth daily as needed for muscle spasms., Disp: 90 tablet, Rfl: 0 .  ezetimibe (ZETIA)  10 MG tablet, Take 1 tablet (10 mg total) by mouth at bedtime., Disp: 90 tablet, Rfl: 3 .  fluticasone (FLONASE) 50 MCG/ACT nasal spray, Place into both nostrils daily., Disp: , Rfl:  .  HYDROcodone-acetaminophen (NORCO/VICODIN) 5-325 MG per tablet, Take 1 tablet by mouth daily as needed for moderate pain. , Disp: , Rfl:  .  metoprolol succinate (TOPROL-XL) 50 MG 24 hr tablet, Alternate taking 50 mg (1 tablet) daily and 25 mg (1/2 tablet) daily (Patient taking differently: Take 25 mg by mouth every other day. Alternate taking 50 mg (1 tablet) daily and 25 mg (1/2 tablet) daily), Disp: 90 tablet, Rfl: 3 .  metoprolol succinate (TOPROL-XL) 50 MG 24 hr tablet, TAKE 1 TABLET DAILY (Patient taking differently: Take 50 mg by mouth every other day. TAKE 1 TABLET DAILY), Disp: 90 tablet, Rfl: 3 .  naproxen (NAPROSYN) 500 MG tablet, Take 500 mg by mouth 2 (two) times daily as needed., Disp: , Rfl:  .  OLANZapine (ZYPREXA) 5 MG tablet, Take 5 mg by mouth at bedtime., Disp: , Rfl:  .  Omega-3 Fatty Acids (FISH OIL) 1000 MG CAPS, Take 1 capsule by mouth daily., Disp: , Rfl:  .  OVER THE COUNTER MEDICATION, Saline and baking soda mouth rinse, swish and spit 4 to 5 times daily, Disp: ,  Rfl:  .  Potassium 99 MG TABS, Take 1 tablet by mouth daily as needed (cramps). , Disp: , Rfl:  .  ramipril (ALTACE) 10 MG capsule, TAKE 1 CAPSULE DAILY, Disp: 90 capsule, Rfl: 3 .  ranitidine (ZANTAC) 150 MG capsule, Take 150 mg by mouth 2 (two) times daily as needed for heartburn. , Disp: , Rfl:  .  sodium chloride (OCEAN) 0.65 % SOLN nasal spray, Place 1 spray into both nostrils as needed for congestion., Disp: , Rfl:   Review of Systems  Constitutional: Negative.   HENT: Negative.   Eyes: Negative.   Respiratory: Negative.   Cardiovascular: Negative.   Endocrine: Negative.   Musculoskeletal: Negative.   Allergic/Immunologic: Negative.   Neurological: Negative.   Hematological: Negative.   Psychiatric/Behavioral:  Negative.     Social History   Tobacco Use  . Smoking status: Former Smoker    Years: 9.00    Types: Cigarettes    Last attempt to quit: 08/25/1963    Years since quitting: 55.1  . Smokeless tobacco: Never Used  Substance Use Topics  . Alcohol use: Yes    Alcohol/week: 2.0 - 3.0 standard drinks    Types: 2 - 3 Cans of beer per week      Objective:   BP 126/68 (BP Location: Left Arm, Patient Position: Sitting, Cuff Size: Large)   Pulse 60   Temp 97.9 F (36.6 C)   Resp 16   Ht 5\' 11"  (1.803 m)   Wt 185 lb (83.9 kg)   BMI 25.80 kg/m  Vitals:   10/12/18 0823  BP: 126/68  Pulse: 60  Resp: 16  Temp: 97.9 F (36.6 C)  Weight: 185 lb (83.9 kg)  Height: 5\' 11"  (1.803 m)     Physical Exam Constitutional:      Appearance: He is well-developed.  HENT:     Head: Normocephalic and atraumatic.  Eyes:     Conjunctiva/sclera: Conjunctivae normal.     Pupils: Pupils are equal, round, and reactive to light.  Neck:     Musculoskeletal: Normal range of motion and neck supple.     Thyroid: No thyromegaly.  Cardiovascular:     Rate and Rhythm: Normal rate and regular rhythm.     Heart sounds: Normal heart sounds.  Pulmonary:     Effort: Pulmonary effort is normal.     Breath sounds: Normal breath sounds.  Abdominal:     Palpations: Abdomen is soft.  Musculoskeletal: Normal range of motion.  Lymphadenopathy:     Cervical: No cervical adenopathy.  Skin:    General: Skin is warm and dry.  Neurological:     General: No focal deficit present.     Mental Status: He is alert and oriented to person, place, and time. Mental status is at baseline.     Deep Tendon Reflexes: Reflexes are normal and symmetric.  Psychiatric:        Mood and Affect: Mood normal.        Behavior: Behavior normal.        Thought Content: Thought content normal.        Judgment: Judgment normal.         Assessment & Plan    1. CAD in native artery All risk factors treated  2. Essential  (primary) hypertension Controlled  3. Aortic valve sclerosis   4. Hyperlipidemia with target LDL less than 70 On high-dose atorvastatin.RTC 6 months.    I have done the exam and reviewed the above  chart and it is accurate to the best of my knowledge. Development worker, community has been used in this note in any air is in the dictation or transcription are unintentional.  [name], MD  [place]

## 2018-10-13 DIAGNOSIS — H35351 Cystoid macular degeneration, right eye: Secondary | ICD-10-CM | POA: Diagnosis not present

## 2018-10-21 DIAGNOSIS — Z961 Presence of intraocular lens: Secondary | ICD-10-CM | POA: Diagnosis not present

## 2018-10-21 DIAGNOSIS — C8330 Diffuse large B-cell lymphoma, unspecified site: Secondary | ICD-10-CM | POA: Diagnosis not present

## 2018-10-21 DIAGNOSIS — H50011 Monocular esotropia, right eye: Secondary | ICD-10-CM | POA: Diagnosis not present

## 2018-10-21 DIAGNOSIS — H35351 Cystoid macular degeneration, right eye: Secondary | ICD-10-CM | POA: Diagnosis not present

## 2018-10-21 DIAGNOSIS — H532 Diplopia: Secondary | ICD-10-CM | POA: Diagnosis not present

## 2018-10-21 DIAGNOSIS — H53452 Other localized visual field defect, left eye: Secondary | ICD-10-CM | POA: Diagnosis not present

## 2018-10-21 LAB — HM DIABETES EYE EXAM

## 2018-10-28 ENCOUNTER — Other Ambulatory Visit: Payer: Self-pay | Admitting: Cardiovascular Disease

## 2018-10-28 MED ORDER — ATORVASTATIN CALCIUM 40 MG PO TABS: 40.0000 mg | ORAL_TABLET | Freq: Every day | ORAL | 0 refills | Status: DC

## 2018-10-28 MED ORDER — EZETIMIBE 10 MG PO TABS: 10.0000 mg | ORAL_TABLET | Freq: Every day | ORAL | 0 refills | Status: DC

## 2018-10-28 NOTE — Telephone Encounter (Signed)
° ° ° °*  STAT* If patient is at the pharmacy, call can be transferred to refill team.   1. Which medications need to be refilled? (please list name of each medication and dose if known)  atorvastatin (LIPITOR) 40 MG tablet, ezetimibe (ZETIA) 10 MG tablet  2. Which pharmacy/location (including street and city if local pharmacy) is medication to be sent to? [facility], [place]  3. Do they need a 30 day or 90 day supply? [place]

## 2018-10-28 NOTE — Telephone Encounter (Signed)
Rx(s) sent to pharmacy electronically.  

## 2019-01-03 DIAGNOSIS — H35351 Cystoid macular degeneration, right eye: Secondary | ICD-10-CM | POA: Diagnosis not present

## 2019-01-05 ENCOUNTER — Telehealth: Payer: Self-pay | Admitting: Cardiovascular Disease

## 2019-01-05 NOTE — Telephone Encounter (Signed)
LVM to change visit with Dr. Claiborne Billings to video or telephone. It is documented on appt notes that pt would like to do video. Informrmed pt on vm that I need to know what his smartphone number is and if he is able to check bp, hr, and weight at home. Informed on vm that someone will call him a few minutes prior to appt time to review meds, and obtain vitals if he is able to get those. Asked that pt call back to let me know.

## 2019-01-05 NOTE — Telephone Encounter (Signed)
Follow up ° ° °Patient is returning your call. Please call. ° ° ° °

## 2019-01-06 NOTE — Telephone Encounter (Signed)
Virtual Visit Pre-Appointment Phone Call  "(Name), I am calling you today to discuss your upcoming appointment. We are currently trying to limit exposure to the virus that causes COVID-19 by seeing patients at home rather than in the office."  1. "What is the BEST phone number to call the day of the visit?" - include this in appointment notes  2. Do you have or have access to (through a family member/friend) a smartphone with video capability that we can use for your visit?" a. If yes - list this number in appt notes as cell (if different from BEST phone #) and list the appointment type as a VIDEO visit in appointment notes b. If no - list the appointment type as a PHONE visit in appointment notes  3. Confirm consent - "In the setting of the current Covid19 crisis, you are scheduled for a (phone or video) visit with your provider on (date) at (time).  Just as we do with many in-office visits, in order for you to participate in this visit, we must obtain consent.  If you'd like, I can send this to your mychart (if signed up) or email for you to review.  Otherwise, I can obtain your verbal consent now.  All virtual visits are billed to your insurance company just like a normal visit would be.  By agreeing to a virtual visit, we'd like you to understand that the technology does not allow for your provider to perform an examination, and thus may limit your provider's ability to fully assess your condition. If your provider identifies any concerns that need to be evaluated in person, we will make arrangements to do so.  Finally, though the technology is pretty good, we cannot assure that it will always work on either your or our end, and in the setting of a video visit, we may have to convert it to a phone-only visit.  In either situation, we cannot ensure that we have a secure connection.  Are you willing to proceed?" STAFF: Did the patient verbally acknowledge consent to telehealth visit? Document  YES/NO here: YES  4. Advise patient to be prepared - "Two hours prior to your appointment, go ahead and check your blood pressure, pulse, oxygen saturation, and your weight (if you have the equipment to check those) and write them all down. When your visit starts, your provider will ask you for this information. If you have an Apple Watch or Kardia device, please plan to have heart rate information ready on the day of your appointment. Please have a pen and paper handy nearby the day of the visit as well."  5. Give patient instructions for MyChart download to smartphone OR [email or web address] as below if video visit (depending on what platform provider is using)  6. Inform patient they will receive a phone call 15 minutes prior to their appointment time (may be from unknown caller ID) so they should be prepared to answer    TELEPHONE CALL NOTE  KEJON Elijah Peters has been deemed a candidate for a follow-up tele-health visit to limit community exposure during the Covid-19 pandemic. I spoke with the patient via phone to ensure availability of phone/video source, confirm preferred email & phone number, and discuss instructions and expectations.  I reminded ADRIC WREDE to be prepared with any vital sign and/or heart rhythm information that could potentially be obtained via home monitoring, at the time of his visit. I reminded DUEL CONRAD to expect a phone call prior to  his visit.  Therisa Doyne 01/06/2019 1:59 PM  Spoke to pt. He does not have a smartphone, but would like to try video visit on IPAD through his email account ([email or web address]). He stated he does not have a BP cuff, but he does have a weight scale.   IF USING DOXIMITY or DOXY.ME - The patient will receive a link just prior to their visit by text.     FULL LENGTH CONSENT FOR TELE-HEALTH VISIT   I hereby voluntarily request, consent and authorize [facility] and its employed or contracted physicians, physician  assistants, nurse practitioners or other licensed health care professionals (the Practitioner), to provide me with telemedicine health care services (the Services") as deemed necessary by the treating Practitioner. I acknowledge and consent to receive the Services by the Practitioner via telemedicine. I understand that the telemedicine visit will involve communicating with the Practitioner through live audiovisual communication technology and the disclosure of certain medical information by electronic transmission. I acknowledge that I have been given the opportunity to request an in-person assessment or other available alternative prior to the telemedicine visit and am voluntarily participating in the telemedicine visit.  I understand that I have the right to withhold or withdraw my consent to the use of telemedicine in the course of my care at any time, without affecting my right to future care or treatment, and that the Practitioner or I may terminate the telemedicine visit at any time. I understand that I have the right to inspect all information obtained and/or recorded in the course of the telemedicine visit and may receive copies of available information for a reasonable fee.  I understand that some of the potential risks of receiving the Services via telemedicine include:   Delay or interruption in medical evaluation due to technological equipment failure or disruption;  Information transmitted may not be sufficient (e.g. poor resolution of images) to allow for appropriate medical decision making by the Practitioner; and/or   In rare instances, security protocols could fail, causing a breach of personal health information.  Furthermore, I acknowledge that it is my responsibility to provide information about my medical history, conditions and care that is complete and accurate to the best of my ability. I acknowledge that Practitioner's advice, recommendations, and/or decision may be based on  factors not within their control, such as incomplete or inaccurate data provided by me or distortions of diagnostic images or specimens that may result from electronic transmissions. I understand that the practice of medicine is not an exact science and that Practitioner makes no warranties or guarantees regarding treatment outcomes. I acknowledge that I will receive a copy of this consent concurrently upon execution via email to the email address I last provided but may also request a printed copy by calling the office of [facility].    I understand that my insurance will be billed for this visit.   I have read or had this consent read to me.  I understand the contents of this consent, which adequately explains the benefits and risks of the Services being provided via telemedicine.   I have been provided ample opportunity to ask questions regarding this consent and the Services and have had my questions answered to my satisfaction.  I give my informed consent for the services to be provided through the use of telemedicine in my medical care  By participating in this telemedicine visit I agree to the above.

## 2019-01-09 ENCOUNTER — Telehealth: Payer: Self-pay | Admitting: Cardiovascular Disease

## 2019-01-09 NOTE — Telephone Encounter (Signed)
Mychart sent via email,  No smartphone ( will be using IPad for visit), consent (01/06/19), pre reg complete 01/09/19 AF

## 2019-01-10 ENCOUNTER — Encounter: Payer: Self-pay | Admitting: Cardiovascular Disease

## 2019-01-10 ENCOUNTER — Telehealth (INDEPENDENT_AMBULATORY_CARE_PROVIDER_SITE_OTHER): Payer: Medicare Other | Admitting: Cardiovascular Disease

## 2019-01-10 VITALS — Ht 71.0 in | Wt 181.0 lb

## 2019-01-10 DIAGNOSIS — I2510 Atherosclerotic heart disease of native coronary artery without angina pectoris: Secondary | ICD-10-CM

## 2019-01-10 DIAGNOSIS — I358 Other nonrheumatic aortic valve disorders: Secondary | ICD-10-CM | POA: Diagnosis not present

## 2019-01-10 DIAGNOSIS — E785 Hyperlipidemia, unspecified: Secondary | ICD-10-CM | POA: Diagnosis not present

## 2019-01-10 DIAGNOSIS — C310 Malignant neoplasm of maxillary sinus: Secondary | ICD-10-CM

## 2019-01-10 DIAGNOSIS — I10 Essential (primary) hypertension: Secondary | ICD-10-CM | POA: Diagnosis not present

## 2019-01-10 NOTE — Telephone Encounter (Signed)
Spoke to patient advised his video visit is at 2:00 pm this afternoon with Dr.Kelly.Advised he will get a call before visit to review meds.Advised to weigh and check B/P.Stated he does not have a B/P cuff.

## 2019-01-10 NOTE — Patient Instructions (Signed)

## 2019-01-10 NOTE — Progress Notes (Signed)
Virtual Visit via Telephone Note   This visit type was conducted due to national recommendations for restrictions regarding the COVID-19 Pandemic (e.g. social distancing) in an effort to limit this patient's exposure and mitigate transmission in our community.  Due to his co-morbid illnesses, this patient is at least at moderate risk for complications without adequate follow up.  This format is felt to be most appropriate for this patient at this time.  The patient did not have access to video technology/had technical difficulties with video requiring transitioning to audio format only (telephone).  All issues noted in this document were discussed and addressed.  No physical exam could be performed with this format.  Please refer to the patient's chart for his  consent to telehealth for [facility].   Date:  01/10/2019   ID:  Elijah Peters, DOB Feb 22, 1941, MRN [id number]  Patient Location: Home Provider Location: Home  PCP:  Jerrol Banana., MD  Cardiologist:  Shelva Majestic, MD Electrophysiologist:  None   Evaluation Performed:  Follow-Up Visit  Chief Complaint:  18 month F/U  History of Present Illness:    Elijah Peters is a 78 y.o. male who has established CAD and underwent initial PCI to his LAD in 23 while living in [place]. In January 1998, he underwent insertion of 2 stents in his left circumflex coronary artery by me. He has documented mild aortic valve sclerosis, mild concentric LV hypertrophy and normal systolic and diastolic function. Has a history of hyperlipidemia requiring combination therapy and mild hypertension. A nuclear perfusion study was in November 2013 and continued to show normal perfusion. A follow-up echo Doppler study in March 2015  showed an ejection fraction at 55-60% with grade 1 diastolic dysfunction.  His aortic valve is mildly thickened and mildly calcified without restricted mobility.  There was no stenosis.  There was trivial AR.  He did  have mild MR.  His left atrium was mildly dilated.  There is trivial TR.  Pulmonary pressures were normal  Do to insurance issues Crestor was switched atorvastatin 40 mg and he has continued to take Zetia 10 mg.  Laboratory i n February 2015 showed a total cholesterol of 104 triglycerides 73 HDL cholesterol 35 LDL cholesterol 54 and VLDL cholesterol 13. He had a normal chemistry profile and glucose was minimally increased at 102. CBC was normal.  When I  saw him he was  walking 1/2 miles per day which was down from previous 2-3 miles per day.  He denied any exertionally precipitated chest tightness.  He does note some fatigability.  He has prostate seeds in place for prostate CA with his PSA being very stable and low.   He was diagnosed with a left sinus mass and has been evaluated by Dr. Sandy Salaam  at [facility].  When I last saw him, he was scheduled to undergo a biopsy of this sinus mass on Thursday, 03/12/2016.  I recommended that he undergo a nuclear stress test which was done in August 2017.  Prior to any potential major surgery.  Ejection fraction was 63%.  He did not develop ECG changes.  There was normal perfusion.  He had a mild hypertensive blood pressure response to exercise.   He did not require any additional left maxillary surgery since the majority of the tumor was removed at the biopsy.  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  He has undergone chemotherapy  with Rituxan and Cytoxan as well as ARA-C.  He also underwent radiation treatment.  He has a Port-A-Cath, which will be removed on March 6.  I saw him in February 2018 at which time he felt well and denied any episodes of chest pain or palpitations or shortness of breath.   His last echo Doppler in September 2017 showed normal EF of 60-65% with grade 1 diastolic dysfunction, and mild diffuse aortic sclerosis and mitral annular calcification with trivial AR and mild MR.   When he was undergoing chemotherapy, he had stopped some of his cholesterol medication and a subsequent lab study did show an increase in his LDL cholesterol back to 89 from a previous level of 50.    I last saw him in November 2018 and he was back taking atorvastatin 40 mg and Zetia 10 mg.  He continues to take Toprol-XL 50 mg daily and ramipril 10 mg for hypertension.  Since I last saw him he has continued to be stable from a cardiovascular standpoint.  He specifically denies any episodes of chest pain palpitations shortness of breath, PND orthopnea.  He continues to be followed by Dr. Lawerance Bach regarding his remote prostate cancer which is stable.  His large B-cell lymphoma is also stable.  He had undergone an ophthalmologic procedure last year when he developed blurred vision.  His blood pressure has been running around 130/70.  He denies leg swelling.  He has had difficulty with tendinitis of his shoulder and had recently been started on Naprosyn as needed.  He is walking at least 1-1/2 to 2 miles per day.  The patient does not have symptoms concerning for COVID-19 infection (fever, chills, cough, or new shortness of breath).    Past Medical History:  Diagnosis Date   CAD (coronary artery disease)    Cancer ([place])    LYMPHOMA/ TUMOR LEFT SINUS   Cataract    GERD (gastroesophageal reflux disease)    Heart murmur    History of nuclear stress test 06/2012   normal pattern of perfusion; low risk scan   Hyperlipidemia    Hypertension    LVH (left ventricular hypertrophy)    mild, concentric   Mild aortic sclerosis ([place])    Myocardial infarction ([place]) 08/1996   non-Q-wave inferolateral    Non Hodgkin's lymphoma ([place])    Peyronie's disease    Prostate cancer [facility])    Past Surgical History:  Procedure Laterality Date   CARDIAC CATHETERIZATION  1992   PTCA to LAD, in [place]  08/06/2010   right eye   CATARACT EXTRACTION W/PHACO Left 09/23/2016    Procedure: CATARACT EXTRACTION PHACO AND INTRAOCULAR LENS PLACEMENT ([place]);  Surgeon: Estill Cotta, MD;  Location: [facility] ORS;  Service: Ophthalmology;  Laterality: Left;  [place] 1:38.5 AP% 25.3 CDE 48.16 Fluid pack lot # [id number]   CORONARY ANGIOPLASTY WITH STENT PLACEMENT  1998   L circumflex - 3.0x23.9x9 bare metal stent   EYE SURGERY  2011   Cataracts removed.   HEMORROIDECTOMY     HERNIA REPAIR  2004   lymphoma removed     PROSTATE SURGERY  03/2009   seed implant due to prostate cancer   TONSILLECTOMY  childhood   TRANSTHORACIC ECHOCARDIOGRAM  09/01/2011   EF=>55%; mild conc LVH; borderline RV enlargement; LA mildly dilated; mild mitral annular calcif; mild-mod MR; RV systolic pressure elevated; AV mildly sclerotic; mild AV regurg; aortic root sclerosis/calcif     Current Meds  Medication Sig  aspirin 81 MG tablet Take 81 mg by mouth daily.    atorvastatin (LIPITOR) 40 MG tablet Take 1 tablet (40 mg total) by mouth at bedtime. MUST KEEP APPOINTMENT 01/10/19 WITH DR Claiborne Billings FOR FUTURE REFILLS   B COMPLEX VITAMINS PO Take 1 tablet by mouth daily as needed (energy).    calcium elemental as carbonate (TUMS ULTRA 1000) 400 MG chewable tablet Chew 1,000 mg by mouth daily as needed for heartburn.   co-enzyme Q-10 50 MG capsule Take 200 mg by mouth daily.    Cod Liver Oil CAPS Take 4 capsules by mouth daily.    cyclobenzaprine (FLEXERIL) 10 MG tablet Take 1 tablet (10 mg total) by mouth daily as needed for muscle spasms.   ezetimibe (ZETIA) 10 MG tablet Take 1 tablet (10 mg total) by mouth at bedtime. MUST KEEP APPOINTMENT 01/10/19 WITH DR Claiborne Billings FOR FUTURE REFILLS   HYDROcodone-acetaminophen (NORCO/VICODIN) 5-325 MG per tablet Take 1 tablet by mouth daily as needed for moderate pain.    Icosapent Ethyl (VASCEPA) 1 g CAPS Take 2 g by mouth daily.   metoprolol succinate (TOPROL-XL) 50 MG 24 hr tablet Alternate taking 50 mg (1 tablet) daily and 25 mg (1/2 tablet) daily (Patient  taking differently: Take 25 mg by mouth every other day. Alternate taking 50 mg (1 tablet) daily and 25 mg (1/2 tablet) daily)   metoprolol succinate (TOPROL-XL) 50 MG 24 hr tablet TAKE 1 TABLET DAILY (Patient taking differently: Take 50 mg by mouth every other day. TAKE 1 TABLET DAILY)   naproxen (NAPROSYN) 500 MG tablet Take 500 mg by mouth 2 (two) times daily as needed.   OVER THE COUNTER MEDICATION Saline and baking soda mouth rinse, swish and spit 4 to 5 times daily   Potassium 99 MG TABS Take 1 tablet by mouth daily as needed (cramps).    ramipril (ALTACE) 10 MG capsule TAKE 1 CAPSULE DAILY   Vitamin D, Cholecalciferol, 10 MCG (400 UNIT) CAPS Take 1 tablet by mouth daily as needed.     Allergies:   Niacin and related and Niacin   Social History   Tobacco Use   Smoking status: Former Smoker    Years: 9.00    Types: Cigarettes    Last attempt to quit: 08/25/1963    Years since quitting: 55.4   Smokeless tobacco: Never Used  Substance Use Topics   Alcohol use: Yes    Alcohol/week: 2.0 - 3.0 standard drinks    Types: 2 - 3 Cans of beer per week   Drug use: No     Family Hx: The patient's family history includes COPD in his father; Emphysema in his father; Heart disease in his brother; Hyperlipidemia in his brother; Hypertension in his brother; Stroke in his mother.  ROS:   Please see the history of present illness.   No fever chills or night sweats, no change in taste or smell He developed blurred vision and was evaluated by Dr. Durward Fortes for optic disc edema No exertional dyspnea No anginal symptoms No PND orthopnea, presyncope or syncope Right shoulder tendinitis No abdominal discomfort No edema No  neurologic symptoms Sleeping well All other systems reviewed and are negative.   Prior CV studies:   The following studies were reviewed today:  Nuclear Study Highlights 03/24/2016    The left ventricular ejection fraction is normal (55-65%).  Nuclear  stress EF: 63%.  Blood pressure demonstrated a hypertensive response to exercise.  There was no ST segment deviation noted during stress.  No T wave inversion was noted during stress.  This is a low risk study.   Normal perfusion. LVEF 63% with normal wall motion. Good exercise tolerance with hypertensive response to exercise. No chest pain. This is a low risk study.     ------------------------------------------------------------------- Echo Doppler study 03/09/2016  Study Conclusions  - Left ventricle: Global longitudinal LV strain is normal at -19%  The cavity size was normal. There was moderate focal basal and  mild concentric hypertrophy. Systolic function was normal. The  estimated ejection fraction was in the range of 60% to 65%. Wall  motion was normal; there were no regional wall motion  abnormalities. There was an increased relative contribution of  atrial contraction to ventricular filling. Doppler parameters are  consistent with abnormal left ventricular relaxation (grade 1  diastolic dysfunction). - Aortic valve: Moderately calcified annulus. Trileaflet. Mild  diffuse calcification, consistent with sclerosis. There was  trivial regurgitation. - Mitral valve: Calcified annulus. There was mild regurgitation. - Pulmonic valve: There was trivial regurgitation.    I have reviewed the records of Dr. Rosana Hoes, Dr. Reita Chard, Dr. Rosanna Randy and Dr. Durward Fortes  Labs/Other Tests and Data Reviewed:    EKG:  An ECG dated 07/22/2017 was personally reviewed today and demonstrated:  Sinus bradycardia at 49 bpm.  No ectopy.  Normal intervals.  Recent Labs: 04/21/2018: ALT 20; BUN 14; Creatinine, Ser 0.93; Hemoglobin 14.6; Platelets 153; Potassium 4.5; Sodium 142; TSH 1.220   Recent Lipid Panel Lab Results  Component Value Date/Time   CHOL 138 04/21/2018 09:55 AM   TRIG 163 (H) 04/21/2018 09:55 AM   HDL 30 (L) 04/21/2018 09:55 AM   CHOLHDL 2.8 07/22/2017 10:38 AM    LDLCALC 75 04/21/2018 09:55 AM    Wt Readings from Last 3 Encounters:  01/10/19 181 lb (82.1 kg)  10/12/18 185 lb (83.9 kg)  04/12/18 183 lb (83 kg)     Objective:    Vital Signs:  Ht 5\' 11"  (1.803 m)    Wt 181 lb (82.1 kg)    BMI 25.24 kg/m    Since the video visit could not be completed this was a phone visit  He denies any significant change in his general appearance His breathing is normal and not labored I did not hear audible wheezing Palpation of his pulse revealed a normal pulse He denied any tenderness when he pressed on his chest No abdominal tenderness Recent tendinitis of his right shoulder No leg swelling No neurologic symptoms Normal cognition and mood  ASSESSMENT & PLAN:    1. CAD: Initial PCI to his LAD in 1992; bare-metal stenting to his left circumflex coronary artery in January 1998.  Currently asymptomatic without recurrent anginal symptoms.  I again reviewed his most recent nuclear perfusion study which was done prior to his sinus surgery.  This was low risk without scar or ischemia. 2. Essential hypertension: Blood pressure today is stable on his regimen consisting of ramipril 10 mg and Toprol 50 mg alternating with 25 mg every other day. 3. Hyperlipidemia with target LDL less than 70: Currently on atorvastatin 40 mg, Zetia 10 mg and Vascepa. 4. Aortic valve sclerosis, last echo 2017.  Will reassess cardiac murmur at next face-to-face office visit and if greater recommend repeat echo Doppler assessment. 5. History of prostate CA: Currently stable followed by Dr. Lawerance Bach 6. History of malignant tumor of the maxillary sinus/diffuse large B-cell lymphoma, clinical stage I E; currently stable completed chemotherapy followed by Dr. Reita Chard  COVID-19 Education: The  signs and symptoms of COVID-19 were discussed with the patient and how to seek care for testing (follow up with PCP or arrange E-visit).  The importance of social distancing was discussed  today.  Time:   Today, I have spent 26 minutes with the patient with telehealth technology discussing the above problems.     Medication Adjustments/Labs and Tests Ordered: Current medicines are reviewed at length with the patient today.  Concerns regarding medicines are outlined above.   Tests Ordered: No orders of the defined types were placed in this encounter.   Medication Changes: No orders of the defined types were placed in this encounter.   Disposition:  Follow up 6 months  Signed, Shelva Majestic, MD  01/10/2019 1:46 PM    [facility]

## 2019-01-10 NOTE — Telephone Encounter (Signed)
Follow Up      Pt is calling and says he has not gotten an email for his video chat yet    Please call

## 2019-01-19 DIAGNOSIS — D225 Melanocytic nevi of trunk: Secondary | ICD-10-CM | POA: Diagnosis not present

## 2019-01-19 DIAGNOSIS — I8393 Asymptomatic varicose veins of bilateral lower extremities: Secondary | ICD-10-CM | POA: Diagnosis not present

## 2019-01-19 DIAGNOSIS — L570 Actinic keratosis: Secondary | ICD-10-CM | POA: Diagnosis not present

## 2019-01-19 DIAGNOSIS — L821 Other seborrheic keratosis: Secondary | ICD-10-CM | POA: Diagnosis not present

## 2019-01-19 DIAGNOSIS — D229 Melanocytic nevi, unspecified: Secondary | ICD-10-CM | POA: Diagnosis not present

## 2019-01-19 DIAGNOSIS — L820 Inflamed seborrheic keratosis: Secondary | ICD-10-CM | POA: Diagnosis not present

## 2019-01-19 DIAGNOSIS — Z1283 Encounter for screening for malignant neoplasm of skin: Secondary | ICD-10-CM | POA: Diagnosis not present

## 2019-01-19 DIAGNOSIS — L219 Seborrheic dermatitis, unspecified: Secondary | ICD-10-CM | POA: Diagnosis not present

## 2019-01-19 DIAGNOSIS — L812 Freckles: Secondary | ICD-10-CM | POA: Diagnosis not present

## 2019-01-19 DIAGNOSIS — D1800 Hemangioma unspecified site: Secondary | ICD-10-CM | POA: Diagnosis not present

## 2019-01-19 DIAGNOSIS — D2230 Melanocytic nevi of unspecified part of face: Secondary | ICD-10-CM | POA: Diagnosis not present

## 2019-01-19 DIAGNOSIS — L578 Other skin changes due to chronic exposure to nonionizing radiation: Secondary | ICD-10-CM | POA: Diagnosis not present

## 2019-02-01 DIAGNOSIS — M2241 Chondromalacia patellae, right knee: Secondary | ICD-10-CM | POA: Diagnosis not present

## 2019-02-02 DIAGNOSIS — C8339 Diffuse large B-cell lymphoma, extranodal and solid organ sites: Secondary | ICD-10-CM | POA: Diagnosis not present

## 2019-02-06 DIAGNOSIS — M25561 Pain in right knee: Secondary | ICD-10-CM | POA: Diagnosis not present

## 2019-02-06 DIAGNOSIS — M6281 Muscle weakness (generalized): Secondary | ICD-10-CM | POA: Diagnosis not present

## 2019-02-07 DIAGNOSIS — H35371 Puckering of macula, right eye: Secondary | ICD-10-CM | POA: Diagnosis not present

## 2019-02-09 ENCOUNTER — Other Ambulatory Visit: Payer: Self-pay

## 2019-02-09 DIAGNOSIS — M62830 Muscle spasm of back: Secondary | ICD-10-CM

## 2019-02-09 NOTE — Telephone Encounter (Signed)
Patient's wife called stating that patients muscle spasms in his back has flared up again. She states the muscle relaxer's and pain medication  that they have at home expired (Flexeril and Hydrocodone)  and she wants to know if Dr. Rosanna Randy will send in a new prescription. Please advise.

## 2019-02-15 NOTE — Telephone Encounter (Signed)
Please review. I did not see this message until now. Thanks!

## 2019-02-16 MED ORDER — CYCLOBENZAPRINE HCL 10 MG PO TABS: 10.0000 mg | ORAL_TABLET | Freq: Every day | ORAL | 0 refills | Status: DC | PRN

## 2019-02-16 MED ORDER — HYDROCODONE-ACETAMINOPHEN 5-325 MG PO TABS: 1.0000 | ORAL_TABLET | Freq: Every day | ORAL | 0 refills | Status: DC | PRN

## 2019-03-23 ENCOUNTER — Other Ambulatory Visit: Payer: Self-pay | Admitting: Cardiovascular Disease

## 2019-04-06 NOTE — Progress Notes (Signed)
Subjective:   Elijah Peters is a 78 y.o. male who presents for Medicare Annual/Subsequent preventive examination.  Review of Systems:  N/A  Cardiac Risk Factors include: advanced age (>86men, >48 women);hypertension;male gender;dyslipidemia     Objective:    Vitals: BP 128/62 (BP Location: Right Arm)   Pulse (!) 59   Temp 97.9 F (36.6 C) (Oral)   Ht 5\' 11"  (1.803 m)   Wt 182 lb 12.8 oz (82.9 kg)   BMI 25.50 kg/m   Body mass index is 25.5 kg/m.  Advanced Directives 04/10/2019 04/06/2018 09/23/2016 09/17/2016  Does Patient Have a Medical Advance Directive? Yes Yes Yes Yes  Type of Paramedic of [place];Living will [place];Living will [place];Living will [place];Living will  Does patient want to make changes to medical advance directive? - - No - Patient declined -  Copy of [place] in Chart? Yes - validated most recent copy scanned in chart (See row information) Yes No - copy requested No - copy requested    Tobacco Social History   Tobacco Use  Smoking Status Former Smoker  . Years: 9.00  . Types: Cigarettes  . Quit date: 08/25/1963  . Years since quitting: 55.6  Smokeless Tobacco Never Used     Counseling given: Not Answered   Clinical Intake:  Pre-visit preparation completed: Yes  Pain : No/denies pain Pain Score: 0-No pain     Nutritional Status: BMI 25 -29 Overweight Nutritional Risks: None Diabetes: No  How often do you need to have someone help you when you read instructions, pamphlets, or other written materials from your doctor or pharmacy?: 1 - Never  Interpreter Needed?: No  Information entered by :: [facility], LPN  Past Medical History:  Diagnosis Date  . CAD (coronary artery disease)   . Cancer (HCC)    LYMPHOMA/ TUMOR LEFT SINUS  . Cataract   . GERD (gastroesophageal reflux disease)   . Heart murmur   . History of nuclear  stress test 06/2012   normal pattern of perfusion; low risk scan  . Hyperlipidemia   . Hypertension   . LVH (left ventricular hypertrophy)    mild, concentric  . Mild aortic sclerosis ([place])   . Myocardial infarction ([place]) 08/1996   non-Q-wave inferolateral   . Non Hodgkin's lymphoma ([place])   . Peyronie's disease   . Prostate cancer [facility])    Past Surgical History:  Procedure Laterality Date  . CARDIAC CATHETERIZATION  1992   PTCA to LAD, in [place]  . CATARACT EXTRACTION  08/06/2010   right eye  . CATARACT EXTRACTION W/PHACO Left 09/23/2016   Procedure: CATARACT EXTRACTION PHACO AND INTRAOCULAR LENS PLACEMENT (IOC);  Surgeon: Estill Cotta, MD;  Location: [facility] ORS;  Service: Ophthalmology;  Laterality: Left;  [place] 1:38.5 AP% 25.3 CDE 48.16 Fluid pack lot # [id number]  . CORONARY ANGIOPLASTY WITH STENT PLACEMENT  1998   L circumflex - 3.0x23.9x9 bare metal stent  . EYE SURGERY  2011   Cataracts removed.  Marland Kitchen HEMORROIDECTOMY    . HERNIA REPAIR  2004  . lymphoma removed    . PROSTATE SURGERY  03/2009   seed implant due to prostate cancer  . TONSILLECTOMY  childhood  . TRANSTHORACIC ECHOCARDIOGRAM  09/01/2011   EF=>55%; mild conc LVH; borderline RV enlargement; LA mildly dilated; mild mitral annular calcif; mild-mod MR; RV systolic pressure elevated; AV mildly sclerotic; mild AV regurg; aortic root sclerosis/calcif   Family History  Problem Relation Age of Onset  . Emphysema Father   . COPD Father   . Stroke Mother   . Hypertension Brother   . Hyperlipidemia Brother   . Heart disease Brother        CAD   Social History   Socioeconomic History  . Marital status: Married    Spouse name: Not on file  . Number of children: 4  . Years of education: Not on file  . Highest education level: Bachelor's degree (e.g., BA, AB, BS)  Occupational History  . Occupation: retired    Fish farm manager: [employer]  . Financial resource strain: Not hard at all  . Food insecurity     Worry: Never true    Inability: Never true  . Transportation needs    Medical: No    Non-medical: No  Tobacco Use  . Smoking status: Former Smoker    Years: 9.00    Types: Cigarettes    Quit date: 08/25/1963    Years since quitting: 55.6  . Smokeless tobacco: Never Used  Substance and Sexual Activity  . Alcohol use: Yes    Alcohol/week: 2.0 - 3.0 standard drinks    Types: 2 - 3 Glasses of wine per week  . Drug use: No  . Sexual activity: Never  Lifestyle  . Physical activity    Days per week: 0 days    Minutes per session: 0 min  . Stress: Not at all  Relationships  . Social Herbalist on phone: Patient refused    Gets together: Patient refused    Attends religious service: Patient refused    Active member of club or organization: Patient refused    Attends meetings of clubs or organizations: Patient refused    Relationship status: Patient refused  Other Topics Concern  . Not on file  Social History Narrative  . Not on file    Outpatient Encounter Medications as of 04/10/2019  Medication Sig  . aspirin 81 MG tablet Take 81 mg by mouth daily.   Marland Kitchen atorvastatin (LIPITOR) 40 MG tablet Take 1 tablet (40 mg total) by mouth at bedtime. MUST KEEP APPOINTMENT 01/10/19 WITH DR Claiborne Billings FOR FUTURE REFILLS  . B COMPLEX VITAMINS PO Take 1 tablet by mouth daily as needed (energy).   . calcium elemental as carbonate (TUMS ULTRA 1000) 400 MG chewable tablet Chew 1,000 mg by mouth daily as needed for heartburn.  . co-enzyme Q-10 50 MG capsule Take 200 mg by mouth daily.   Marland Kitchen Cod Liver Oil CAPS Take 4 capsules by mouth daily.   . cyclobenzaprine (FLEXERIL) 10 MG tablet Take 1 tablet (10 mg total) by mouth daily as needed for muscle spasms.  Marland Kitchen ezetimibe (ZETIA) 10 MG tablet Take 1 tablet (10 mg total) by mouth at bedtime. MUST KEEP APPOINTMENT 01/10/19 WITH DR Claiborne Billings FOR FUTURE REFILLS  . HYDROcodone-acetaminophen (NORCO/VICODIN) 5-325 MG tablet Take 1 tablet by mouth daily as needed  for moderate pain.  Vanessa Kick Ethyl (VASCEPA) 1 g CAPS Take 2 g by mouth 2 (two) times daily.   . metoprolol succinate (TOPROL-XL) 50 MG 24 hr tablet Alternate taking 50 mg (1 tablet) daily and 25 mg (1/2 tablet) daily (Patient taking differently: Take 25 mg by mouth every other day. Alternate taking 50 mg (1 tablet) daily and 25 mg (1/2 tablet) daily)  . naproxen (NAPROSYN) 500 MG tablet Take 500 mg by mouth 2 (two) times daily as needed.  Marland Kitchen OLANZapine (ZYPREXA)  5 MG tablet Take 5 mg by mouth at bedtime.  Marland Kitchen OVER THE COUNTER MEDICATION Saline and baking soda mouth rinse, swish and spit 4 to 5 times daily  . Potassium 99 MG TABS Take 1 tablet by mouth daily as needed (cramps).   . ramipril (ALTACE) 10 MG capsule TAKE 1 CAPSULE DAILY  . Vitamin D, Cholecalciferol, 10 MCG (400 UNIT) CAPS Take 1 tablet by mouth daily as needed.  . metoprolol succinate (TOPROL-XL) 50 MG 24 hr tablet TAKE 1 TABLET DAILY (Patient not taking: Reported on 04/10/2019)   No facility-administered encounter medications on file as of 04/10/2019.     Activities of Daily Living In your present state of health, do you have any difficulty performing the following activities: 04/10/2019  Hearing? N  Vision? Y  Comment Due to double visions. Pt is being seen by Dr Sandra Cockayne for this issue. Wears eye glasses daily.  Difficulty concentrating or making decisions? N  Walking or climbing stairs? N  Dressing or bathing? N  Doing errands, shopping? N  Preparing Food and eating ? N  Using the Toilet? N  In the past six months, have you accidently leaked urine? N  Do you have problems with loss of bowel control? N  Managing your Medications? N  Managing your Finances? N  Housekeeping or managing your Housekeeping? N  Some recent data might be hidden    Patient Care Team: Jerrol Banana., MD as PCP - General (Family Medicine) Dingeldein, Remo Lipps, MD as Consulting Physician (Ophthalmology) Myrlene Broker, MD as  Attending Physician (Urology) Troy Sine, MD as Consulting Physician (Cardiology) Corey Harold, MD as Referring Physician (Internal Medicine) Ralene Bathe, MD (Dermatology)   Assessment:   This is a routine wellness examination for [place].  Exercise Activities and Dietary recommendations Current Exercise Habits: Home exercise routine, Type of exercise: walking, Time (Minutes): 30, Frequency (Times/Week): 6, Weekly Exercise (Minutes/Week): 180, Intensity: Mild, Exercise limited by: None identified  Goals    . Increase water intake     Starting 09/17/16, I will increase my water intake to 4-5 glasses a day.       Fall Risk: Fall Risk  04/10/2019 04/06/2018 10/13/2017 09/17/2016 10/09/2015  Falls in the past year? 0 Yes Yes Yes No  Number falls in past yr: - 1 1 1  -  Injury with Fall? - No Yes No -  Comment - - Hematoma after falling when a chair slipped out from under him. - -  Follow up - Falls prevention discussed Falls prevention discussed Falls prevention discussed -    FALL RISK PREVENTION PERTAINING TO THE HOME:  Any stairs in or around the home? Yes  If so, are there any without handrails? No   Home free of loose throw rugs in walkways, pet beds, electrical cords, etc? Yes  Adequate lighting in your home to reduce risk of falls? Yes   ASSISTIVE DEVICES UTILIZED TO PREVENT FALLS:  Life alert? No  Use of a cane, walker or w/c? No  Grab bars in the bathroom? No  Shower chair or bench in shower? Yes  Elevated toilet seat or a handicapped toilet? Yes   TIMED UP AND GO:  Was the test performed? No .    Depression Screen PHQ 2/9 Scores 04/10/2019 04/06/2018 10/13/2017 09/17/2016  PHQ - 2 Score 0 0 0 0    Cognitive Function     6CIT Screen 04/10/2019 09/17/2016  What Year? 0 points 0 points  What  month? 0 points 0 points  What time? 0 points 0 points  Count back from 20 0 points 0 points  Months in reverse 0 points 0 points  Repeat phrase 0 points 2 points   Total Score 0 2    Immunization History  Administered Date(s) Administered  . Influenza, High Dose Seasonal PF 04/14/2017, 06/25/2018  . Influenza-Unspecified 05/24/2013, 05/30/2015  . Pneumococcal Conjugate-13 09/20/2014  . Pneumococcal Polysaccharide-23 07/01/2011  . Tdap 12/27/2014  . Zoster 09/27/2012    Qualifies for Shingles Vaccine? Yes  Zostavax completed 09/27/12. Due for Shingrix. Education has been provided regarding the importance of this vaccine. Pt has been advised to call insurance company to determine out of pocket expense. Advised may also receive vaccine at local pharmacy or Health Dept. Verbalized acceptance and understanding.  Tdap: Up to date  Flu Vaccine: Due fall 2020  Pneumococcal Vaccine: Completed series  Screening Tests Health Maintenance  Topic Date Due  . INFLUENZA VACCINE  03/25/2019  . TETANUS/TDAP  12/26/2024  . PNA vac Low Risk Adult  Completed   Cancer Screenings:  Colorectal Screening: No longer required.   Lung Cancer Screening: (Low Dose CT Chest recommended if Age 54-80 years, 30 pack-year currently smoking OR have quit w/in 15years.) does not qualify.   Additional Screening:  Vision Screening: Recommended annual ophthalmology exams for early detection of glaucoma and other disorders of the eye.  Dental Screening: Recommended annual dental exams for proper oral hygiene  Community Resource Referral:  CRR required this visit?  No        Plan:  I have personally reviewed and addressed the Medicare Annual Wellness questionnaire and have noted the following in the patient's chart:  A. Medical and social history B. Use of alcohol, tobacco or illicit drugs  C. Current medications and supplements D. Functional ability and status E.  Nutritional status F.  Physical activity G. Advance directives H. List of other physicians I.  Hospitalizations, surgeries, and ER visits in previous 12 months J.  [place] such as hearing  and vision if needed, cognitive and depression L. Referrals and appointments   In addition, I have reviewed and discussed with patient certain preventive protocols, quality metrics, and best practice recommendations. A written personalized care plan for preventive services as well as general preventive health recommendations were provided to patient.   [place] Score, [place]  [id number] Nurse Health Advisor   Nurse Notes: Influenza

## 2019-04-10 ENCOUNTER — Ambulatory Visit (INDEPENDENT_AMBULATORY_CARE_PROVIDER_SITE_OTHER): Payer: Medicare Other | Admitting: Family Medicine

## 2019-04-10 ENCOUNTER — Ambulatory Visit: Payer: Self-pay | Admitting: Family Medicine

## 2019-04-10 ENCOUNTER — Other Ambulatory Visit: Payer: Self-pay

## 2019-04-10 ENCOUNTER — Ambulatory Visit (INDEPENDENT_AMBULATORY_CARE_PROVIDER_SITE_OTHER): Payer: Medicare Other

## 2019-04-10 VITALS — BP 128/62 | HR 59 | Temp 97.9°F | Ht 71.0 in | Wt 182.8 lb

## 2019-04-10 VITALS — BP 128/62 | HR 59 | Temp 97.9°F | Resp 6 | Ht 71.0 in | Wt 182.0 lb

## 2019-04-10 DIAGNOSIS — M720 Palmar fascial fibromatosis [Dupuytren]: Secondary | ICD-10-CM | POA: Diagnosis not present

## 2019-04-10 DIAGNOSIS — C61 Malignant neoplasm of prostate: Secondary | ICD-10-CM | POA: Diagnosis not present

## 2019-04-10 DIAGNOSIS — I358 Other nonrheumatic aortic valve disorders: Secondary | ICD-10-CM

## 2019-04-10 DIAGNOSIS — I2510 Atherosclerotic heart disease of native coronary artery without angina pectoris: Secondary | ICD-10-CM

## 2019-04-10 DIAGNOSIS — Z0000 Encounter for general adult medical examination without abnormal findings: Secondary | ICD-10-CM

## 2019-04-10 DIAGNOSIS — I10 Essential (primary) hypertension: Secondary | ICD-10-CM | POA: Diagnosis not present

## 2019-04-10 DIAGNOSIS — C8331 Diffuse large B-cell lymphoma, lymph nodes of head, face, and neck: Secondary | ICD-10-CM | POA: Diagnosis not present

## 2019-04-10 DIAGNOSIS — E785 Hyperlipidemia, unspecified: Secondary | ICD-10-CM | POA: Diagnosis not present

## 2019-04-10 NOTE — Progress Notes (Signed)
Patient: Elijah Peters Male    DOB: 25-Aug-1940   78 y.o.   MRN: [id number] Visit Date: 04/10/2019  Today's Provider: [name], MD    Subjective:   Patient had AWV today.  HPI  Hypertension, follow-up:  BP Readings from Last 3 Encounters:  04/10/19 128/62  04/10/19 128/62  10/12/18 126/68    He was last seen for hypertension 6 months ago.  BP at that visit was 126/68. Management since that visit includes no changes. He reports good compliance with treatment. He is not having side effects. He is not exercising. He is adherent to low salt diet.   Outside blood pressures are checked occasionally. He is experiencing none.  Patient denies exertional chest pressure/discomfort and lower extremity edema Cardiovascular risk factors include dyslipidemia.    Weight trend: stable Wt Readings from Last 3 Encounters:  04/10/19 182 lb (82.6 kg)  04/10/19 182 lb 12.8 oz (82.9 kg)  01/10/19 181 lb (82.1 kg)    Current diet: well balanced   Lipid/Cholesterol, Follow-up:   Last seen for this6 months ago.  Management changes since that visit include no changes. . Last Lipid Panel:    Component Value Date/Time   CHOL 138 04/21/2018 0955   TRIG 163 (H) 04/21/2018 0955   HDL 30 (L) 04/21/2018 0955   CHOLHDL 2.8 07/22/2017 1038   [place] 75 04/21/2018 0955    Risk factors for vascular disease include hypertension  He reports good compliance with treatment. He is not having side effects.  Current symptoms include none and have been stable. CAD followed by dr Claiborne Billings. We will follow up for prostate cancer as urology has released him. Lymphoma per oncology. Allergies  Allergen Reactions  . Niacin And Related Itching  . Niacin Itching     Current Outpatient Medications:  .  aspirin 81 MG tablet, Take 81 mg by mouth daily. , Disp: , Rfl:  .  atorvastatin (LIPITOR) 40 MG tablet, Take 1 tablet (40 mg total) by mouth at bedtime. MUST KEEP APPOINTMENT  01/10/19 WITH DR Claiborne Billings FOR FUTURE REFILLS, Disp: 90 tablet, Rfl: 0 .  B COMPLEX VITAMINS PO, Take 1 tablet by mouth daily as needed (energy). , Disp: , Rfl:  .  calcium elemental as carbonate (TUMS ULTRA 1000) 400 MG chewable tablet, Chew 1,000 mg by mouth daily as needed for heartburn., Disp: , Rfl:  .  co-enzyme Q-10 50 MG capsule, Take 200 mg by mouth daily. , Disp: , Rfl:  .  Cod Liver Oil CAPS, Take 4 capsules by mouth daily. , Disp: , Rfl:  .  cyclobenzaprine (FLEXERIL) 10 MG tablet, Take 1 tablet (10 mg total) by mouth daily as needed for muscle spasms., Disp: 90 tablet, Rfl: 0 .  ezetimibe (ZETIA) 10 MG tablet, Take 1 tablet (10 mg total) by mouth at bedtime. MUST KEEP APPOINTMENT 01/10/19 WITH DR Claiborne Billings FOR FUTURE REFILLS, Disp: 90 tablet, Rfl: 0 .  HYDROcodone-acetaminophen (NORCO/VICODIN) 5-325 MG tablet, Take 1 tablet by mouth daily as needed for moderate pain., Disp: 30 tablet, Rfl: 0 .  Icosapent Ethyl (VASCEPA) 1 g CAPS, Take 2 g by mouth 2 (two) times daily. , Disp: , Rfl:  .  metoprolol succinate (TOPROL-XL) 50 MG 24 hr tablet, Alternate taking 50 mg (1 tablet) daily and 25 mg (1/2 tablet) daily (Patient taking differently: Take 25 mg by mouth every other day. Alternate taking 50 mg (1 tablet) daily and 25 mg (1/2 tablet) daily), Disp: 90  tablet, Rfl: 3 .  metoprolol succinate (TOPROL-XL) 50 MG 24 hr tablet, TAKE 1 TABLET DAILY (Patient not taking: Reported on 04/10/2019), Disp: 90 tablet, Rfl: 3 .  naproxen (NAPROSYN) 500 MG tablet, Take 500 mg by mouth 2 (two) times daily as needed., Disp: , Rfl:  .  OLANZapine (ZYPREXA) 5 MG tablet, Take 5 mg by mouth at bedtime., Disp: , Rfl:  .  OVER THE COUNTER MEDICATION, Saline and baking soda mouth rinse, swish and spit 4 to 5 times daily, Disp: , Rfl:  .  Potassium 99 MG TABS, Take 1 tablet by mouth daily as needed (cramps). , Disp: , Rfl:  .  ramipril (ALTACE) 10 MG capsule, TAKE 1 CAPSULE DAILY, Disp: 90 capsule, Rfl: 3 .  Vitamin D,  Cholecalciferol, 10 MCG (400 UNIT) CAPS, Take 1 tablet by mouth daily as needed., Disp: , Rfl:   Review of Systems  Constitutional: Negative.   HENT: Negative.   Eyes: Negative.   Respiratory: Negative for cough and shortness of breath.   Cardiovascular: Negative for chest pain, palpitations and leg swelling.  Gastrointestinal: Negative.   Endocrine: Negative.   Allergic/Immunologic: Negative for environmental allergies.  Neurological: Negative for dizziness, light-headedness and headaches.  Psychiatric/Behavioral: Negative for agitation.    Social History   Tobacco Use  . Smoking status: Former Smoker    Years: 9.00    Types: Cigarettes    Quit date: 08/25/1963    Years since quitting: 55.6  . Smokeless tobacco: Never Used  Substance Use Topics  . Alcohol use: Yes    Alcohol/week: 2.0 - 3.0 standard drinks    Types: 2 - 3 Glasses of wine per week      Objective:   BP 128/62   Pulse (!) 59   Temp 97.9 F (36.6 C)   Resp (!) 6   Ht 5\' 11"  (1.803 m)   Wt 182 lb (82.6 kg)   BMI 25.38 kg/m  Vitals:   04/10/19 0920  BP: 128/62  Pulse: (!) 59  Resp: (!) 6  Temp: 97.9 F (36.6 C)  Weight: 182 lb (82.6 kg)  Height: 5\' 11"  (1.803 m)     Physical Exam Vitals signs reviewed.  Constitutional:      Appearance: He is well-developed.  HENT:     Head: Normocephalic and atraumatic.     Right Ear: External ear normal.     Left Ear: External ear normal.     Nose: Nose normal.  Eyes:     General: No scleral icterus.    Conjunctiva/sclera: Conjunctivae normal.     Pupils: Pupils are equal, round, and reactive to light.  Neck:     Musculoskeletal: Normal range of motion and neck supple.     Thyroid: No thyromegaly.  Cardiovascular:     Rate and Rhythm: Normal rate and regular rhythm.     Heart sounds: Normal heart sounds.  Pulmonary:     Effort: Pulmonary effort is normal.     Breath sounds: Normal breath sounds.  Abdominal:     Palpations: Abdomen is soft.   Musculoskeletal: Normal range of motion.  Lymphadenopathy:     Cervical: No cervical adenopathy.  Skin:    General: Skin is warm and dry.  Neurological:     General: No focal deficit present.     Mental Status: He is alert and oriented to person, place, and time.     Deep Tendon Reflexes: Reflexes are normal and symmetric.  Psychiatric:  Mood and Affect: Mood normal.        Behavior: Behavior normal.        Thought Content: Thought content normal.        Judgment: Judgment normal.      No results found for any visits on 04/10/19.     Assessment & Plan    1. Essential (primary) hypertension Controlled with Altace and Toprol. - Comprehensive metabolic panel  2. Hyperlipidemia with target LDL less than 70 On Lipitor and Zetia.  - Lipid panel - TSH  3. Prostate CA (HCC)  - PSA  4. CAD in native artery  - CBC with Differential/Platelet  5. Contracture of palmar fascia (Dupuytren's)   6. Aortic valve sclerosis   7. 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

## 2019-04-10 NOTE — Patient Instructions (Signed)
Mr. Elijah Peters , Thank you for taking time to come for your Medicare Wellness Visit. I appreciate your ongoing commitment to your health goals. Please review the following plan we discussed and let me know if I can assist you in the future.   Screening recommendations/referrals: Colonoscopy: No longer required.  Recommended yearly ophthalmology/optometry visit for glaucoma screening and checkup Recommended yearly dental visit for hygiene and checkup  Vaccinations: Influenza vaccine: Due fall 2020 Pneumococcal vaccine: Completed series Tdap vaccine: Up to date, due 12/2024 Shingles vaccine: Pt declines today.     Advanced directives: Currently on file.   Conditions/risks identified: Continue to increase water intake to 6-8 8 oz glasses a day.   Next appointment: 9:20 AM today with Dr Rosanna Randy.   Preventive Care 65 Years and Older, Male Preventive care refers to lifestyle choices and visits with your health care provider that can promote health and wellness. What does preventive care include?  A yearly physical exam. This is also called an annual well check.  Dental exams once or twice a year.  Routine eye exams. Ask your health care provider how often you should have your eyes checked.  Personal lifestyle choices, including:  Daily care of your teeth and gums.  Regular physical activity.  Eating a healthy diet.  Avoiding tobacco and drug use.  Limiting alcohol use.  Practicing safe sex.  Taking low doses of aspirin every day.  Taking vitamin and mineral supplements as recommended by your health care provider. What happens during an annual well check? The services and screenings done by your health care provider during your annual well check will depend on your age, overall health, lifestyle risk factors, and family history of disease. Counseling  Your health care provider may ask you questions about your:  Alcohol use.  Tobacco use.  Drug use.  Emotional  well-being.  Home and relationship well-being.  Sexual activity.  Eating habits.  History of falls.  Memory and ability to understand (cognition).  Work and work Statistician. Screening  You may have the following tests or measurements:  Height, weight, and BMI.  Blood pressure.  Lipid and cholesterol levels. These may be checked every 5 years, or more frequently if you are over 19 years old.  Skin check.  Lung cancer screening. You may have this screening every year starting at age 37 if you have a 30-pack-year history of smoking and currently smoke or have quit within the past 15 years.  Fecal occult blood test (FOBT) of the stool. You may have this test every year starting at age 36.  Flexible sigmoidoscopy or colonoscopy. You may have a sigmoidoscopy every 5 years or a colonoscopy every 10 years starting at age 79.  Prostate cancer screening. Recommendations will vary depending on your family history and other risks.  Hepatitis C blood test.  Hepatitis B blood test.  Sexually transmitted disease (STD) testing.  Diabetes screening. This is done by checking your blood sugar (glucose) after you have not eaten for a while (fasting). You may have this done every 1-3 years.  Abdominal aortic aneurysm (AAA) screening. You may need this if you are a current or former smoker.  Osteoporosis. You may be screened starting at age 55 if you are at high risk. Talk with your health care provider about your test results, treatment options, and if necessary, the need for more tests. Vaccines  Your health care provider may recommend certain vaccines, such as:  Influenza vaccine. This is recommended every year.  Tetanus,  diphtheria, and acellular pertussis (Tdap, Td) vaccine. You may need a Td booster every 10 years.  Zoster vaccine. You may need this after age 32.  Pneumococcal 13-valent conjugate (PCV13) vaccine. One dose is recommended after age 12.  Pneumococcal  polysaccharide (PPSV23) vaccine. One dose is recommended after age 45. Talk to your health care provider about which screenings and vaccines you need and how often you need them. This information is not intended to replace advice given to you by your health care provider. Make sure you discuss any questions you have with your health care provider. Document Released: 09/06/2015 Document Revised: 04/29/2016 Document Reviewed: 06/11/2015 Elsevier Interactive Patient Education  2017 [place] Prevention in the Home Falls can cause injuries. They can happen to people of all ages. There are many things you can do to make your home safe and to help prevent falls. What can I do on the outside of my home?  Regularly fix the edges of walkways and driveways and fix any cracks.  Remove anything that might make you trip as you walk through a door, such as a raised step or threshold.  Trim any bushes or trees on the path to your home.  Use bright outdoor lighting.  Clear any walking paths of anything that might make someone trip, such as rocks or tools.  Regularly check to see if handrails are loose or broken. Make sure that both sides of any steps have handrails.  Any raised decks and porches should have guardrails on the edges.  Have any leaves, snow, or ice cleared regularly.  Use sand or salt on walking paths during winter.  Clean up any spills in your garage right away. This includes oil or grease spills. What can I do in the bathroom?  Use night lights.  Install grab bars by the toilet and in the tub and shower. Do not use towel bars as grab bars.  Use non-skid mats or decals in the tub or shower.  If you need to sit down in the shower, use a plastic, non-slip stool.  Keep the floor dry. Clean up any water that spills on the floor as soon as it happens.  Remove soap buildup in the tub or shower regularly.  Attach bath mats securely with double-sided non-slip rug tape.   Do not have throw rugs and other things on the floor that can make you trip. What can I do in the bedroom?  Use night lights.  Make sure that you have a light by your bed that is easy to reach.  Do not use any sheets or blankets that are too big for your bed. They should not hang down onto the floor.  Have a firm chair that has side arms. You can use this for support while you get dressed.  Do not have throw rugs and other things on the floor that can make you trip. What can I do in the kitchen?  Clean up any spills right away.  Avoid walking on wet floors.  Keep items that you use a lot in easy-to-reach places.  If you need to reach something above you, use a strong step stool that has a grab bar.  Keep electrical cords out of the way.  Do not use floor polish or wax that makes floors slippery. If you must use wax, use non-skid floor wax.  Do not have throw rugs and other things on the floor that can make you trip. What can I do with  my stairs?  Do not leave any items on the stairs.  Make sure that there are handrails on both sides of the stairs and use them. Fix handrails that are broken or loose. Make sure that handrails are as long as the stairways.  Check any carpeting to make sure that it is firmly attached to the stairs. Fix any carpet that is loose or worn.  Avoid having throw rugs at the top or bottom of the stairs. If you do have throw rugs, attach them to the floor with carpet tape.  Make sure that you have a light switch at the top of the stairs and the bottom of the stairs. If you do not have them, ask someone to add them for you. What else can I do to help prevent falls?  Wear shoes that:  Do not have high heels.  Have rubber bottoms.  Are comfortable and fit you well.  Are closed at the toe. Do not wear sandals.  If you use a stepladder:  Make sure that it is fully opened. Do not climb a closed stepladder.  Make sure that both sides of the  stepladder are locked into place.  Ask someone to hold it for you, if possible.  Clearly mark and make sure that you can see:  Any grab bars or handrails.  First and last steps.  Where the edge of each step is.  Use tools that help you move around (mobility aids) if they are needed. These include:  Canes.  Walkers.  Scooters.  Crutches.  Turn on the lights when you go into a dark area. Replace any light bulbs as soon as they burn out.  Set up your furniture so you have a clear path. Avoid moving your furniture around.  If any of your floors are uneven, fix them.  If there are any pets around you, be aware of where they are.  Review your medicines with your doctor. Some medicines can make you feel dizzy. This can increase your chance of falling. Ask your doctor what other things that you can do to help prevent falls. This information is not intended to replace advice given to you by your health care provider. Make sure you discuss any questions you have with your health care provider. Document Released: 06/06/2009 Document Revised: 01/16/2016 Document Reviewed: 09/14/2014 Elsevier Interactive Patient Education  2017 [employer].

## 2019-04-11 ENCOUNTER — Ambulatory Visit: Payer: Self-pay | Admitting: Family Medicine

## 2019-04-11 ENCOUNTER — Telehealth: Payer: Self-pay

## 2019-04-11 LAB — TSH: TSH: 2.07 u[IU]/mL (ref 0.450–4.500)

## 2019-04-11 LAB — COMPREHENSIVE METABOLIC PANEL
ALT: 14 IU/L (ref 0–44)
AST: 23 IU/L (ref 0–40)
Albumin/Globulin Ratio: 2.1 (ref 1.2–2.2)
Albumin: 4.2 g/dL (ref 3.7–4.7)
Alkaline Phosphatase: 88 IU/L (ref 39–117)
BUN/Creatinine Ratio: 13 (ref 10–24)
BUN: 12 mg/dL (ref 8–27)
Bilirubin Total: 1 mg/dL (ref 0.0–1.2)
CO2: 22 mmol/L (ref 20–29)
Calcium: 9.1 mg/dL (ref 8.6–10.2)
Chloride: 102 mmol/L (ref 96–106)
Creatinine, Ser: 0.91 mg/dL (ref 0.76–1.27)
GFR calc Af Amer: 92 mL/min/{1.73_m2} (ref >59)
GFR calc non Af Amer: 80 mL/min/{1.73_m2} (ref >59)
Globulin, Total: 2 g/dL (ref 1.5–4.5)
Glucose: 90 mg/dL (ref 65–99)
Potassium: 4.7 mmol/L (ref 3.5–5.2)
Sodium: 139 mmol/L (ref 134–144)
Total Protein: 6.2 g/dL (ref 6.0–8.5)

## 2019-04-11 LAB — CBC WITH DIFFERENTIAL/PLATELET
Basophils Absolute: 0 10*3/uL (ref 0.0–0.2)
Basos: 1 %
EOS (ABSOLUTE): 0.1 10*3/uL (ref 0.0–0.4)
Eos: 1 %
Hematocrit: 42.4 % (ref 37.5–51.0)
Hemoglobin: 14.7 g/dL (ref 13.0–17.7)
Immature Grans (Abs): 0 10*3/uL (ref 0.0–0.1)
Immature Granulocytes: 0 %
Lymphocytes Absolute: 1.8 10*3/uL (ref 0.7–3.1)
Lymphs: 37 %
MCH: 33.2 pg — ABNORMAL HIGH (ref 26.6–33.0)
MCHC: 34.7 g/dL (ref 31.5–35.7)
MCV: 96 fL (ref 79–97)
Monocytes Absolute: 0.5 10*3/uL (ref 0.1–0.9)
Monocytes: 9 %
Neutrophils Absolute: 2.5 10*3/uL (ref 1.4–7.0)
Neutrophils: 52 %
Platelets: 162 10*3/uL (ref 150–450)
RBC: 4.43 x10E6/uL (ref 4.14–5.80)
RDW: 12 % (ref 11.6–15.4)
WBC: 4.8 10*3/uL (ref 3.4–10.8)

## 2019-04-11 LAB — LIPID PANEL
Chol/HDL Ratio: 3.7 ratio (ref 0.0–5.0)
Cholesterol, Total: 119 mg/dL (ref 100–199)
HDL: 32 mg/dL — ABNORMAL LOW (ref >39)
LDL Calculated: 67 mg/dL (ref 0–99)
Triglycerides: 99 mg/dL (ref 0–149)
VLDL Cholesterol Cal: 20 mg/dL (ref 5–40)

## 2019-04-11 LAB — PSA: Prostate Specific Ag, Serum: <0.1 ng/mL (ref 0.0–4.0)

## 2019-04-11 NOTE — Telephone Encounter (Signed)
-----   Message from Jerrol Banana., MD sent at 04/11/2019  8:47 AM EDT ----- Labs ok

## 2019-04-11 NOTE — Telephone Encounter (Signed)
Patient notified of lab results

## 2019-04-12 ENCOUNTER — Telehealth: Payer: Self-pay

## 2019-04-12 NOTE — Telephone Encounter (Signed)
-----   Message from Jerrol Banana., MD sent at 04/12/2019  3:25 PM EDT ----- Labs good.

## 2019-04-12 NOTE — Telephone Encounter (Signed)
Pt advised.   Thanks,   -Laura  

## 2019-05-29 DIAGNOSIS — C61 Malignant neoplasm of prostate: Secondary | ICD-10-CM | POA: Diagnosis not present

## 2019-06-16 DIAGNOSIS — Z23 Encounter for immunization: Secondary | ICD-10-CM | POA: Diagnosis not present

## 2019-07-06 DIAGNOSIS — H35351 Cystoid macular degeneration, right eye: Secondary | ICD-10-CM | POA: Diagnosis not present

## 2019-07-17 ENCOUNTER — Other Ambulatory Visit: Payer: Self-pay

## 2019-08-22 ENCOUNTER — Ambulatory Visit (INDEPENDENT_AMBULATORY_CARE_PROVIDER_SITE_OTHER): Payer: Medicare Other | Admitting: Physician Assistant

## 2019-08-22 ENCOUNTER — Other Ambulatory Visit: Payer: Self-pay

## 2019-08-22 ENCOUNTER — Encounter: Payer: Self-pay | Admitting: Physician Assistant

## 2019-08-22 VITALS — BP 158/78 | HR 54 | Ht 70.5 in | Wt 181.6 lb

## 2019-08-22 DIAGNOSIS — I2510 Atherosclerotic heart disease of native coronary artery without angina pectoris: Secondary | ICD-10-CM | POA: Diagnosis not present

## 2019-08-22 DIAGNOSIS — I10 Essential (primary) hypertension: Secondary | ICD-10-CM | POA: Diagnosis not present

## 2019-08-22 DIAGNOSIS — C8590 Non-Hodgkin lymphoma, unspecified, unspecified site: Secondary | ICD-10-CM

## 2019-08-22 DIAGNOSIS — E785 Hyperlipidemia, unspecified: Secondary | ICD-10-CM | POA: Diagnosis not present

## 2019-08-22 MED ORDER — ICOSAPENT ETHYL 1 G PO CAPS: 2.0000 g | ORAL_CAPSULE | Freq: Two times a day (BID) | ORAL | 6 refills | Status: DC

## 2019-08-22 NOTE — Patient Instructions (Signed)
Medication Instructions:  °Your physician recommends that you continue on your current medications as directed. Please refer to the Current Medication list given to you today. ° °*If you need a refill on your cardiac medications before your next appointment, please call your pharmacy* ° °Lab Work: °NONE ordered at this time of appointment  ° °If you have labs (blood work) drawn today and your tests are completely normal, you will receive your results only by: °MyChart Message (if you have MyChart) OR °A paper copy in the mail °If you have any lab test that is abnormal or we need to change your treatment, we will call you to review the results. ° °Testing/Procedures: °NONE ordered at this time of appointment  ° °Follow-Up: °At [facility], you and your health needs are our priority.  As part of our continuing mission to provide you with exceptional heart care, we have created designated Provider Care Teams.  These Care Teams include your primary Cardiologist (physician) and Advanced Practice Providers (APPs -  Physician Assistants and Nurse Practitioners) who all work together to provide you with the care you need, when you need it. ° °Your next appointment:   °6 month(s) ° °The format for your next appointment:   °In Person ° °Provider:   °Thomas Kelly, MD   ° °Other Instructions ° ° °

## 2019-08-22 NOTE — Progress Notes (Signed)
Cardiology Office Note:    Date:  08/24/2019   ID:  Elijah Peters, DOB [birth year], MRN [id number]  PCP:  Jerrol Banana., MD  Cardiologist:  Shelva Majestic, MD  Electrophysiologist:  None   Referring MD: Jerrol Banana.,*   Chief Complaint  Patient presents with  . Follow-up    seen for Dr. Claiborne Billings.     History of Present Illness:    Elijah Peters is a 78 y.o. male with a hx of CAD s/p PCI to LAD in 1992 while living in [place], HTN, HLD, prostate cancer and non-Hodgkin's lymphoma.  In 1998, he underwent insertion to stent to the left circumflex artery. Echocardiogram in March 2015 showed normal EF of 55 to 60%, grade 1 DD, trivial AI and mild MR.  Last Myoview was obtained in July 2017 prior to sinus procedure, this showed EF 63%, normal perfusion.  Biopsy of the sinus tumor found to have diffuse large B-cell lymphoma, clinical stage I.  He eventually underwent chemotherapy and radiation therapy.  Repeat echocardiogram in September 2017 showed EF 60 to 65%, grade 1 DD, trivial AI and mild MR.  He was last seen via virtual visit by Dr. Claiborne Billings on 01/10/2019 at which time he was doing well and able to walk up to 2 miles per day.  Patient presents today for cardiology office visit.  He denies any recent chest pain, lower extremity edema, orthopnea or PND.  He has been compliant with his medication.  Most recent lipid panel obtained in August showed normal LDL, triglyceride and total cholesterol, chronically low HDL.  Despite his low HDL, Elijah Peters has been quite active and to continue to walk on a daily basis.  I recommended continue on the current therapy and to follow-up with Dr. Claiborne Billings in 6 months.  Past Medical History:  Diagnosis Date  . CAD (coronary artery disease)   . Cancer (HCC)    LYMPHOMA/ TUMOR LEFT SINUS  . Cataract   . GERD (gastroesophageal reflux disease)   . Heart murmur   . History of nuclear stress test 06/2012   normal pattern of perfusion; low  risk scan  . Hyperlipidemia   . Hypertension   . LVH (left ventricular hypertrophy)    mild, concentric  . Mild aortic sclerosis   . Myocardial infarction ([place]) 08/1996   non-Q-wave inferolateral   . Non Hodgkin's lymphoma ([place])   . Peyronie's disease   . Prostate cancer [facility])     Past Surgical History:  Procedure Laterality Date  . CARDIAC CATHETERIZATION  1992   PTCA to LAD, in [place]  . CATARACT EXTRACTION  08/06/2010   right eye  . CATARACT EXTRACTION W/PHACO Left 09/23/2016   Procedure: CATARACT EXTRACTION PHACO AND INTRAOCULAR LENS PLACEMENT (IOC);  Surgeon: Estill Cotta, MD;  Location: [facility] ORS;  Service: Ophthalmology;  Laterality: Left;  [place] 1:38.5 AP% 25.3 CDE 48.16 Fluid pack lot # [id number]  . CORONARY ANGIOPLASTY WITH STENT PLACEMENT  1998   L circumflex - 3.0x23.9x9 bare metal stent  . EYE SURGERY  2011   Cataracts removed.  Marland Kitchen HEMORROIDECTOMY    . HERNIA REPAIR  2004  . lymphoma removed    . PROSTATE SURGERY  03/2009   seed implant due to prostate cancer  . TONSILLECTOMY  childhood  . TRANSTHORACIC ECHOCARDIOGRAM  09/01/2011   EF=>55%; mild conc LVH; borderline RV enlargement; LA mildly dilated; mild mitral annular calcif; mild-mod MR; RV systolic pressure elevated; AV mildly sclerotic; mild AV  regurg; aortic root sclerosis/calcif    Current Medications: Current Meds  Medication Sig  . aspirin 81 MG tablet Take 81 mg by mouth daily.   Marland Kitchen atorvastatin (LIPITOR) 40 MG tablet Take 1 tablet (40 mg total) by mouth at bedtime. MUST KEEP APPOINTMENT 01/10/19 WITH DR Claiborne Billings FOR FUTURE REFILLS  . B COMPLEX VITAMINS PO Take 1 tablet by mouth daily as needed (energy).   . calcium elemental as carbonate (TUMS ULTRA 1000) 400 MG chewable tablet Chew 1,000 mg by mouth daily as needed for heartburn.  . co-enzyme Q-10 50 MG capsule Take 200 mg by mouth daily.   Marland Kitchen Cod Liver Oil CAPS Take 4 capsules by mouth daily.   . cyclobenzaprine (FLEXERIL) 10 MG tablet Take 1 tablet (10  mg total) by mouth daily as needed for muscle spasms.  Marland Kitchen ezetimibe (ZETIA) 10 MG tablet Take 1 tablet (10 mg total) by mouth at bedtime. MUST KEEP APPOINTMENT 01/10/19 WITH DR Claiborne Billings FOR FUTURE REFILLS  . HYDROcodone-acetaminophen (NORCO/VICODIN) 5-325 MG tablet Take 1 tablet by mouth daily as needed for moderate pain.  . metoprolol succinate (TOPROL-XL) 50 MG 24 hr tablet Alternate taking 50 mg (1 tablet) daily and 25 mg (1/2 tablet) daily (Patient taking differently: Take 25 mg by mouth every other day. Alternate taking 50 mg (1 tablet) daily and 25 mg (1/2 tablet) daily)  . naproxen (NAPROSYN) 500 MG tablet Take 500 mg by mouth 2 (two) times daily as needed.  Marland Kitchen OVER THE COUNTER MEDICATION Saline and baking soda mouth rinse, swish and spit 4 to 5 times daily  . Potassium 99 MG TABS Take 1 tablet by mouth daily as needed (cramps).   . ramipril (ALTACE) 10 MG capsule TAKE 1 CAPSULE DAILY  . Vitamin D, Cholecalciferol, 10 MCG (400 UNIT) CAPS Take 1 tablet by mouth daily as needed.  . [DISCONTINUED] Icosapent Ethyl (VASCEPA) 1 g CAPS Take 2 g by mouth 2 (two) times daily.      Allergies:   Niacin and related and Niacin   Social History   Socioeconomic History  . Marital status: Married    Spouse name: Not on file  . Number of children: 4  . Years of education: Not on file  . Highest education level: Bachelor's degree (e.g., BA, AB, BS)  Occupational History  . Occupation: retired    Fish farm manager: [employer]  Tobacco Use  . Smoking status: Former Smoker    Years: 9.00    Types: Cigarettes    Quit date: 08/25/1963    Years since quitting: 56.0  . Smokeless tobacco: Never Used  Substance and Sexual Activity  . Alcohol use: Yes    Alcohol/week: 2.0 - 3.0 standard drinks    Types: 2 - 3 Glasses of wine per week  . Drug use: No  . Sexual activity: Never  Other Topics Concern  . Not on file  Social History Narrative  . Not on file   Social Determinants of Health   Financial Resource  Strain:   . Difficulty of Paying Living Expenses: Not on file  Food Insecurity:   . Worried About Charity fundraiser in the Last Year: Not on file  . Ran Out of Food in the Last Year: Not on file  Transportation Needs:   . Lack of Transportation (Medical): Not on file  . Lack of Transportation (Non-Medical): Not on file  Physical Activity: Inactive  . Days of Exercise per Week: 0 days  . Minutes of Exercise per  Session: 0 min  Stress:   . Feeling of Stress : Not on file  Social Connections: Unknown  . Frequency of Communication with Friends and Family: Patient refused  . Frequency of Social Gatherings with Friends and Family: Patient refused  . Attends Religious Services: Patient refused  . Active Member of Clubs or Organizations: Patient refused  . Attends Archivist Meetings: Patient refused  . Marital Status: Patient refused     Family History: The patient's family history includes COPD in his father; Emphysema in his father; Heart disease in his brother; Hyperlipidemia in his brother; Hypertension in his brother; Stroke in his mother.  ROS:   Please see the history of present illness.     All other systems reviewed and are negative.  EKGs/Labs/Other Studies Reviewed:    The following studies were reviewed today:  Echo 03/09/2016 LV EF: 60% -   65% Study Conclusions  - Left ventricle: Global longitudinal LV strain is normal at -19%   The cavity size was normal. There was moderate focal basal and   mild concentric hypertrophy. Systolic function was normal. The   estimated ejection fraction was in the range of 60% to 65%. Wall   motion was normal; there were no regional wall motion   abnormalities. There was an increased relative contribution of   atrial contraction to ventricular filling. Doppler parameters are   consistent with abnormal left ventricular relaxation (grade 1   diastolic dysfunction). - Aortic valve: Moderately calcified annulus. Trileaflet.  Mild   diffuse calcification, consistent with sclerosis. There was   trivial regurgitation. - Mitral valve: Calcified annulus. There was mild regurgitation. - Pulmonic valve: There was trivial regurgitation.    Myoview 03/24/2016 Study Highlights    The left ventricular ejection fraction is normal (55-65%).  Nuclear stress EF: 63%.  Blood pressure demonstrated a hypertensive response to exercise.  There was no ST segment deviation noted during stress.  No T wave inversion was noted during stress.  This is a low risk study.   Normal perfusion. LVEF 63% with normal wall motion. Good exercise tolerance with hypertensive response to exercise. No chest pain. This is a low risk study.     EKG:  EKG is ordered today.  The ekg ordered today demonstrates sinus bradycardia, Q waves in the inferior lead.  Recent Labs: 04/10/2019: ALT 14; BUN 12; Creatinine, Ser 0.91; Hemoglobin 14.7; Platelets 162; Potassium 4.7; Sodium 139; TSH 2.070  Recent Lipid Panel    Component Value Date/Time   CHOL 119 04/10/2019 1025   TRIG 99 04/10/2019 1025   HDL 32 (L) 04/10/2019 1025   CHOLHDL 3.7 04/10/2019 1025   LDLCALC 67 04/10/2019 1025    Physical Exam:    VS:  BP (!) 158/78   Pulse (!) 54   Ht 5' 10.5" (1.791 m)   Wt 181 lb 9.6 oz (82.4 kg)   SpO2 98%   BMI 25.69 kg/m     Wt Readings from Last 3 Encounters:  08/22/19 181 lb 9.6 oz (82.4 kg)  04/10/19 182 lb (82.6 kg)  04/10/19 182 lb 12.8 oz (82.9 kg)     GEN:  Well nourished, well developed in no acute distress HEENT: Normal NECK: No JVD; No carotid bruits LYMPHATICS: No lymphadenopathy CARDIAC: RRR, no murmurs, rubs, gallops RESPIRATORY:  Clear to auscultation without rales, wheezing or rhonchi  ABDOMEN: Soft, non-tender, non-distended MUSCULOSKELETAL:  No edema; No deformity  SKIN: Warm and dry NEUROLOGIC:  Alert and oriented x 3 PSYCHIATRIC:  Normal affect   ASSESSMENT:    1. Coronary artery disease involving native  coronary artery of native heart without angina pectoris   2. Essential hypertension   3. Hyperlipidemia with target LDL less than 70   4. Non-Hodgkin's lymphoma, unspecified body region, unspecified non-Hodgkin lymphoma type ([place])    PLAN:    In order of problems listed above:  1. CAD: Denies any recent anginal symptoms.  On aspirin and Lipitor.  2. Hypertension: Blood pressure stable  3. Hyperlipidemia: Panel obtained in August 2020 showed total cholesterol 119, HDL 32, LDL 67, triglyceride 99.  Continue current therapy  4. Non-Hodgkin's lymphoma: Underwent chemo and radiation therapy.   Medication Adjustments/Labs and Tests Ordered: Current medicines are reviewed at length with the patient today.  Concerns regarding medicines are outlined above.  Orders Placed This Encounter  Procedures  . EKG 12-Lead   Meds ordered this encounter  Medications  . icosapent Ethyl (VASCEPA) 1 g capsule    Sig: Take 2 capsules (2 g total) by mouth 2 (two) times daily.    Dispense:  120 capsule    Refill:  6    Patient Instructions  Medication Instructions:  Your physician recommends that you continue on your current medications as directed. Please refer to the Current Medication list given to you today.  *If you need a refill on your cardiac medications before your next appointment, please call your pharmacy*  Lab Work: NONE ordered at this time of appointment   If you have labs (blood work) drawn today and your tests are completely normal, you will receive your results only by: Marland Kitchen MyChart Message (if you have MyChart) OR . A paper copy in the mail If you have any lab test that is abnormal or we need to change your treatment, we will call you to review the results.  Testing/Procedures: NONE ordered at this time of appointment   Follow-Up: At [facility], you and your health needs are our priority.  As part of our continuing mission to provide you with exceptional heart care, we have  created designated Provider Care Teams.  These Care Teams include your primary Cardiologist (physician) and Advanced Practice Providers (APPs -  Physician Assistants and Nurse Practitioners) who all work together to provide you with the care you need, when you need it.  Your next appointment:   6 month(s)  The format for your next appointment:   In Person  Provider:   Shelva Majestic, MD  Other Instructions      Signed, Almyra Deforest, [place]  08/24/2019 12:43 AM    [place]

## 2019-08-24 ENCOUNTER — Encounter: Payer: Self-pay | Admitting: Physician Assistant

## 2019-09-07 DIAGNOSIS — I10 Essential (primary) hypertension: Secondary | ICD-10-CM | POA: Diagnosis not present

## 2019-09-07 DIAGNOSIS — C8339 Diffuse large B-cell lymphoma, extranodal and solid organ sites: Secondary | ICD-10-CM | POA: Diagnosis not present

## 2019-09-19 ENCOUNTER — Other Ambulatory Visit: Payer: Self-pay | Admitting: Cardiovascular Disease

## 2019-09-20 ENCOUNTER — Ambulatory Visit: Payer: Medicare Other

## 2019-09-29 ENCOUNTER — Ambulatory Visit: Payer: Medicare Other | Attending: Family Medicine

## 2019-09-29 DIAGNOSIS — Z23 Encounter for immunization: Secondary | ICD-10-CM | POA: Insufficient documentation

## 2019-09-29 NOTE — Progress Notes (Signed)
   Covid-19 Vaccination Clinic  Name:  Elijah Peters    MRN: [id number] DOB: Jun 10, 1941  09/29/2019  [name] was observed post Covid-19 immunization for 15 minutes without incidence. He was provided with Vaccine Information Sheet and instruction to access the V-Safe system.   [name] was instructed to call 911 with any severe reactions post vaccine: Marland Kitchen Difficulty breathing  . Swelling of your face and throat  . A fast heartbeat  . A bad rash all over your body  . Dizziness and weakness    Immunizations Administered    Name Date Dose VIS Date Route   Pfizer COVID-19 Vaccine 09/29/2019  9:21 AM 0.3 mL 08/04/2019 Intramuscular   Manufacturer: [place]   Lot: [id number]   [place]: [id number]

## 2019-10-07 ENCOUNTER — Ambulatory Visit: Payer: Medicare Other

## 2019-10-10 NOTE — Progress Notes (Signed)
Patient: Elijah Peters Male    DOB: Oct 31, 1940   78 y.o.   MRN: [id number] Visit Date: 10/11/2019  Today's Provider: [name], MD   Chief Complaint  Patient presents with  . Hypertension  . Nail Problem   Subjective:     HPI  Patient presents in office today for follow up hypertension and patient states that he would like to address concerns of unusual  growth to his great toes. Patient states that he has noticed that a new nail is pushing through the nail bed, patient denies pain or injury.  He has no other issues or complaints.  He feels well. Essential (primary) hypertension From 04/10/2019-Controlled with Altace and Toprol. Labs checked showing-okay.  Hyperlipidemia with target LDL less than 70 From 04/10/2019-On Lipitor and Zetia. Labs checked showing-okay.  CAD in native artery From 04/10/2019-Labs checked showing-okay.  Aortic valve sclerosis From 04/10/2019-Labs checked showing-okay.  Diffuse large B-cell lymphoma of lymph nodes of head ([place]) From 04/10/2019-Per Oncology.   Allergies  Allergen Reactions  . Niacin And Related Itching  . Niacin Itching     Current Outpatient Medications:  .  aspirin 81 MG tablet, Take 81 mg by mouth daily. , Disp: , Rfl:  .  atorvastatin (LIPITOR) 40 MG tablet, Take 1 tablet (40 mg total) by mouth at bedtime. MUST KEEP APPOINTMENT 01/10/19 WITH DR Claiborne Billings FOR FUTURE REFILLS, Disp: 90 tablet, Rfl: 0 .  B COMPLEX VITAMINS PO, Take 1 tablet by mouth daily as needed (energy). , Disp: , Rfl:  .  calcium elemental as carbonate (TUMS ULTRA 1000) 400 MG chewable tablet, Chew 1,000 mg by mouth daily as needed for heartburn., Disp: , Rfl:  .  co-enzyme Q-10 50 MG capsule, Take 200 mg by mouth daily. , Disp: , Rfl:  .  Cod Liver Oil CAPS, Take 4 capsules by mouth daily. , Disp: , Rfl:  .  cyclobenzaprine (FLEXERIL) 10 MG tablet, Take 1 tablet (10 mg total) by mouth daily as needed for muscle spasms., Disp: 90 tablet, Rfl:  0 .  ezetimibe (ZETIA) 10 MG tablet, Take 1 tablet (10 mg total) by mouth at bedtime. MUST KEEP APPOINTMENT 01/10/19 WITH DR Claiborne Billings FOR FUTURE REFILLS, Disp: 90 tablet, Rfl: 0 .  HYDROcodone-acetaminophen (NORCO/VICODIN) 5-325 MG tablet, Take 1 tablet by mouth daily as needed for moderate pain., Disp: 30 tablet, Rfl: 0 .  icosapent Ethyl (VASCEPA) 1 g capsule, Take 2 capsules (2 g total) by mouth 2 (two) times daily., Disp: 120 capsule, Rfl: 6 .  metoprolol succinate (TOPROL-XL) 50 MG 24 hr tablet, Take 1 tablet (50 mg total) by mouth every other day. Alternate taking 50 mg (1 tablet) daily and 25 mg (1/2 tablet) daily, Disp: 72 tablet, Rfl: 3 .  naproxen (NAPROSYN) 500 MG tablet, Take 500 mg by mouth 2 (two) times daily as needed., Disp: , Rfl:  .  Potassium 99 MG TABS, Take 1 tablet by mouth daily as needed (cramps). , Disp: , Rfl:  .  ramipril (ALTACE) 10 MG capsule, TAKE 1 CAPSULE DAILY, Disp: 90 capsule, Rfl: 3 .  Vitamin D, Cholecalciferol, 10 MCG (400 UNIT) CAPS, Take 1 tablet by mouth daily as needed., Disp: , Rfl:  .  naproxen sodium (ALEVE) 220 MG tablet, Take by mouth., Disp: , Rfl:  .  OVER THE COUNTER MEDICATION, Saline and baking soda mouth rinse, swish and spit 4 to 5 times daily, Disp: , Rfl:   Review of Systems  Constitutional: Negative for appetite change, chills and fever.  HENT: Negative.   Respiratory: Negative for chest tightness, shortness of breath and wheezing.   Cardiovascular: Negative for chest pain and palpitations.  Gastrointestinal: Negative for abdominal pain, nausea and vomiting.  Endocrine: Negative.   Musculoskeletal: Negative.   Skin: Negative.   Allergic/Immunologic: Negative.   Neurological: Negative.   Hematological: Negative.   Psychiatric/Behavioral: Negative.     Social History   Tobacco Use  . Smoking status: Former Smoker    Years: 9.00    Types: Cigarettes    Quit date: 08/25/1963    Years since quitting: 56.1  . Smokeless tobacco: Never  Used  Substance Use Topics  . Alcohol use: Yes    Alcohol/week: 2.0 - 3.0 standard drinks    Types: 2 - 3 Glasses of wine per week      Objective:   BP (!) 144/74   Pulse (!) 58   Temp (!) 97.1 F (36.2 C) (Oral)   Resp 16   Wt 186 lb 6.4 oz (84.6 kg)   BMI 26.37 kg/m  Vitals:   10/11/19 0828  BP: (!) 144/74  Pulse: (!) 58  Resp: 16  Temp: (!) 97.1 F (36.2 C)  TempSrc: Oral  Weight: 186 lb 6.4 oz (84.6 kg)  Body mass index is 26.37 kg/m.   Physical Exam Vitals reviewed.  Constitutional:      Appearance: He is well-developed.  HENT:     Head: Normocephalic and atraumatic.  Eyes:     Conjunctiva/sclera: Conjunctivae normal.     Pupils: Pupils are equal, round, and reactive to light.  Neck:     Thyroid: No thyromegaly.  Cardiovascular:     Rate and Rhythm: Normal rate and regular rhythm.     Heart sounds: Normal heart sounds.  Pulmonary:     Effort: Pulmonary effort is normal.     Breath sounds: Normal breath sounds.  Abdominal:     Palpations: Abdomen is soft.  Musculoskeletal:        General: Normal range of motion.     Cervical back: Normal range of motion and neck supple.  Lymphadenopathy:     Cervical: No cervical adenopathy.  Skin:    General: Skin is warm and dry.     Comments: The proximal half of the nailbed of both great toes appears to be growing in normally under what appears to be onychomycosis of the distal half of the toe nail.  Neurological:     General: No focal deficit present.     Mental Status: He is alert and oriented to person, place, and time. Mental status is at baseline.     Deep Tendon Reflexes: Reflexes are normal and symmetric.  Psychiatric:        Mood and Affect: Mood normal.        Behavior: Behavior normal.        Thought Content: Thought content normal.        Judgment: Judgment normal.      No results found for any visits on 10/11/19.     Assessment & Plan    1. CAD in native artery All risk factors  treated.  2. Aortic valve sclerosis Followed by cardiology  3. Prostate CA [facility]) Remote history.  4. Hypercholesterolemia without hypertriglyceridemia On atorvastatin.  5. Diffuse large B-cell lymphoma, unspecified body region [facility]) In remission, followed by oncology     [name], MD  [facility]

## 2019-10-11 ENCOUNTER — Encounter: Payer: Self-pay | Admitting: Family Medicine

## 2019-10-11 ENCOUNTER — Other Ambulatory Visit: Payer: Self-pay

## 2019-10-11 ENCOUNTER — Ambulatory Visit (INDEPENDENT_AMBULATORY_CARE_PROVIDER_SITE_OTHER): Payer: Medicare Other | Admitting: Family Medicine

## 2019-10-11 VITALS — BP 144/74 | HR 58 | Temp 97.1°F | Resp 16 | Wt 186.4 lb

## 2019-10-11 DIAGNOSIS — C61 Malignant neoplasm of prostate: Secondary | ICD-10-CM

## 2019-10-11 DIAGNOSIS — I358 Other nonrheumatic aortic valve disorders: Secondary | ICD-10-CM

## 2019-10-11 DIAGNOSIS — C8330 Diffuse large B-cell lymphoma, unspecified site: Secondary | ICD-10-CM | POA: Diagnosis not present

## 2019-10-11 DIAGNOSIS — E7800 Pure hypercholesterolemia, unspecified: Secondary | ICD-10-CM | POA: Diagnosis not present

## 2019-10-11 DIAGNOSIS — I2510 Atherosclerotic heart disease of native coronary artery without angina pectoris: Secondary | ICD-10-CM | POA: Diagnosis not present

## 2019-10-16 DIAGNOSIS — L821 Other seborrheic keratosis: Secondary | ICD-10-CM | POA: Diagnosis not present

## 2019-10-16 DIAGNOSIS — D229 Melanocytic nevi, unspecified: Secondary | ICD-10-CM | POA: Diagnosis not present

## 2019-10-16 DIAGNOSIS — Z1283 Encounter for screening for malignant neoplasm of skin: Secondary | ICD-10-CM | POA: Diagnosis not present

## 2019-10-16 DIAGNOSIS — L603 Nail dystrophy: Secondary | ICD-10-CM | POA: Diagnosis not present

## 2019-10-16 DIAGNOSIS — D1801 Hemangioma of skin and subcutaneous tissue: Secondary | ICD-10-CM | POA: Diagnosis not present

## 2019-10-16 DIAGNOSIS — D225 Melanocytic nevi of trunk: Secondary | ICD-10-CM | POA: Diagnosis not present

## 2019-10-16 DIAGNOSIS — L570 Actinic keratosis: Secondary | ICD-10-CM | POA: Diagnosis not present

## 2019-10-16 DIAGNOSIS — L820 Inflamed seborrheic keratosis: Secondary | ICD-10-CM | POA: Diagnosis not present

## 2019-10-16 DIAGNOSIS — L814 Other melanin hyperpigmentation: Secondary | ICD-10-CM | POA: Diagnosis not present

## 2019-10-16 DIAGNOSIS — L578 Other skin changes due to chronic exposure to nonionizing radiation: Secondary | ICD-10-CM | POA: Diagnosis not present

## 2019-10-16 DIAGNOSIS — D2230 Melanocytic nevi of unspecified part of face: Secondary | ICD-10-CM | POA: Diagnosis not present

## 2019-10-24 ENCOUNTER — Ambulatory Visit: Payer: Medicare Other | Attending: Internal Medicine

## 2019-10-24 DIAGNOSIS — Z23 Encounter for immunization: Secondary | ICD-10-CM | POA: Insufficient documentation

## 2019-10-24 NOTE — Progress Notes (Signed)
   Covid-19 Vaccination Clinic  Name:  Elijah Peters    MRN: [id number] DOB: 03-06-1941  10/24/2019  [name] was observed post Covid-19 immunization for 15 minutes without incident. He was provided with Vaccine Information Sheet and instruction to access the V-Safe system.   [name] was instructed to call 911 with any severe reactions post vaccine: Marland Kitchen Difficulty breathing  . Swelling of face and throat  . A fast heartbeat  . A bad rash all over body  . Dizziness and weakness   Immunizations Administered    Name Date Dose VIS Date Route   Pfizer COVID-19 Vaccine 10/24/2019  9:53 AM 0.3 mL 08/04/2019 Intramuscular   Manufacturer: [place]   Lot: [id number]   [place]: [id number]

## 2019-11-12 ENCOUNTER — Other Ambulatory Visit: Payer: Self-pay

## 2019-11-12 ENCOUNTER — Encounter: Payer: Self-pay | Admitting: Emergency Medicine

## 2019-11-12 ENCOUNTER — Emergency Department
Admission: EM | Admit: 2019-11-12 | Discharge: 2019-11-12 | Disposition: A | Discharge status: Home / Self Care | Payer: Medicare Other | Attending: Emergency Medicine | Admitting: Emergency Medicine

## 2019-11-12 ENCOUNTER — Emergency Department: Payer: Medicare Other

## 2019-11-12 DIAGNOSIS — I10 Essential (primary) hypertension: Secondary | ICD-10-CM | POA: Diagnosis not present

## 2019-11-12 DIAGNOSIS — R1033 Periumbilical pain: Secondary | ICD-10-CM | POA: Diagnosis present

## 2019-11-12 DIAGNOSIS — Z955 Presence of coronary angioplasty implant and graft: Secondary | ICD-10-CM | POA: Diagnosis not present

## 2019-11-12 DIAGNOSIS — Z8546 Personal history of malignant neoplasm of prostate: Secondary | ICD-10-CM | POA: Diagnosis not present

## 2019-11-12 DIAGNOSIS — Z79899 Other long term (current) drug therapy: Secondary | ICD-10-CM | POA: Insufficient documentation

## 2019-11-12 DIAGNOSIS — Z7982 Long term (current) use of aspirin: Secondary | ICD-10-CM | POA: Diagnosis not present

## 2019-11-12 DIAGNOSIS — I2510 Atherosclerotic heart disease of native coronary artery without angina pectoris: Secondary | ICD-10-CM | POA: Insufficient documentation

## 2019-11-12 DIAGNOSIS — Z8522 Personal history of malignant neoplasm of nasal cavities, middle ear, and accessory sinuses: Secondary | ICD-10-CM | POA: Diagnosis not present

## 2019-11-12 DIAGNOSIS — Z87891 Personal history of nicotine dependence: Secondary | ICD-10-CM | POA: Diagnosis not present

## 2019-11-12 DIAGNOSIS — I252 Old myocardial infarction: Secondary | ICD-10-CM | POA: Insufficient documentation

## 2019-11-12 DIAGNOSIS — N200 Calculus of kidney: Secondary | ICD-10-CM | POA: Diagnosis not present

## 2019-11-12 DIAGNOSIS — Z8572 Personal history of non-Hodgkin lymphomas: Secondary | ICD-10-CM | POA: Diagnosis not present

## 2019-11-12 DIAGNOSIS — K8020 Calculus of gallbladder without cholecystitis without obstruction: Secondary | ICD-10-CM | POA: Diagnosis not present

## 2019-11-12 LAB — COMPREHENSIVE METABOLIC PANEL
ALT: 19 U/L (ref 0–44)
AST: 28 U/L (ref 15–41)
Albumin: 4.1 g/dL (ref 3.5–5.0)
Alkaline Phosphatase: 83 U/L (ref 38–126)
Anion gap: 12 (ref 5–15)
BUN: 17 mg/dL (ref 8–23)
CO2: 24 mmol/L (ref 22–32)
Calcium: 9.1 mg/dL (ref 8.9–10.3)
Chloride: 102 mmol/L (ref 98–111)
Creatinine, Ser: 1.17 mg/dL (ref 0.61–1.24)
GFR calc Af Amer: >60 mL/min (ref >60)
GFR calc non Af Amer: 59 mL/min — ABNORMAL LOW (ref >60)
Glucose, Bld: 152 mg/dL — ABNORMAL HIGH (ref 70–99)
Potassium: 3.7 mmol/L (ref 3.5–5.1)
Sodium: 138 mmol/L (ref 135–145)
Total Bilirubin: 1.9 mg/dL — ABNORMAL HIGH (ref 0.3–1.2)
Total Protein: 7.3 g/dL (ref 6.5–8.1)

## 2019-11-12 LAB — URINALYSIS, COMPLETE (UACMP) WITH MICROSCOPIC
Bacteria, UA: NONE SEEN
Bilirubin Urine: NEGATIVE
Glucose, UA: NEGATIVE mg/dL
Hgb urine dipstick: NEGATIVE
Ketones, ur: 5 mg/dL — AB
Leukocytes,Ua: NEGATIVE
Nitrite: NEGATIVE
Protein, ur: 30 mg/dL — AB
Specific Gravity, Urine: 1.019 (ref 1.005–1.030)
Squamous Epithelial / HPF: NONE SEEN (ref 0–5)
pH: 6 (ref 5.0–8.0)

## 2019-11-12 LAB — CBC
HCT: 45 % (ref 39.0–52.0)
Hemoglobin: 15.4 g/dL (ref 13.0–17.0)
MCH: 32.6 pg (ref 26.0–34.0)
MCHC: 34.2 g/dL (ref 30.0–36.0)
MCV: 95.1 fL (ref 80.0–100.0)
Platelets: 156 10*3/uL (ref 150–400)
RBC: 4.73 MIL/uL (ref 4.22–5.81)
RDW: 11.6 % (ref 11.5–15.5)
WBC: 8.7 10*3/uL (ref 4.0–10.5)
nRBC: 0 % (ref 0.0–0.2)

## 2019-11-12 LAB — LIPASE, BLOOD: Lipase: 27 U/L (ref 11–51)

## 2019-11-12 MED ORDER — IBUPROFEN 600 MG PO TABS
600.0000 mg | ORAL_TABLET | ORAL | Status: AC
  Administered 2019-11-12: 15:00:00 600 mg via ORAL
  Filled 2019-11-12: qty 1

## 2019-11-12 MED ORDER — MORPHINE SULFATE (PF) 2 MG/ML IV SOLN
2.0000 mg | Freq: Once | INTRAVENOUS | Status: AC
  Administered 2019-11-12: 2 mg via INTRAVENOUS
  Filled 2019-11-12: qty 1

## 2019-11-12 MED ORDER — SODIUM CHLORIDE 0.9 % IV BOLUS
1000.0000 mL | Freq: Once | INTRAVENOUS | Status: AC
  Administered 2019-11-12: 1000 mL via INTRAVENOUS

## 2019-11-12 MED ORDER — ONDANSETRON 4 MG PO TBDP: 4.0000 mg | ORAL_TABLET | Freq: Four times a day (QID) | ORAL | 0 refills | Status: DC | PRN

## 2019-11-12 MED ORDER — IOHEXOL 300 MG/ML  SOLN
100.0000 mL | Freq: Once | INTRAMUSCULAR | Status: AC | PRN
  Administered 2019-11-12: 100 mL via INTRAVENOUS

## 2019-11-12 MED ORDER — FENTANYL CITRATE (PF) 100 MCG/2ML IJ SOLN
50.0000 ug | Freq: Once | INTRAMUSCULAR | Status: AC
  Administered 2019-11-12: 50 ug via INTRAVENOUS
  Filled 2019-11-12: qty 2

## 2019-11-12 MED ORDER — SODIUM CHLORIDE 0.9% FLUSH
3.0000 mL | Freq: Once | INTRAVENOUS | Status: AC
  Administered 2019-11-12: 10 mL via INTRAVENOUS

## 2019-11-12 MED ORDER — HYDROCODONE-ACETAMINOPHEN 5-325 MG PO TABS
1.0000 | ORAL_TABLET | Freq: Once | ORAL | Status: AC
  Administered 2019-11-12: 1 via ORAL
  Filled 2019-11-12: qty 1

## 2019-11-12 MED ORDER — ONDANSETRON HCL 4 MG/2ML IJ SOLN
4.0000 mg | Freq: Once | INTRAMUSCULAR | Status: AC
  Administered 2019-11-12: 4 mg via INTRAVENOUS
  Filled 2019-11-12: qty 2

## 2019-11-12 MED ORDER — ONDANSETRON HCL 4 MG/2ML IJ SOLN
4.0000 mg | Freq: Once | INTRAMUSCULAR | Status: AC | PRN
  Administered 2019-11-12: 4 mg via INTRAVENOUS
  Filled 2019-11-12: qty 2

## 2019-11-12 NOTE — ED Notes (Signed)
Pt given cup of water and saltine crackers, reports pain has improved and denies n/v

## 2019-11-12 NOTE — ED Notes (Signed)
Family at bedside. 

## 2019-11-12 NOTE — ED Notes (Signed)
Received call from  CT pt was vomiting and c/o pain, informed Dr. Jacqualine Code

## 2019-11-12 NOTE — ED Provider Notes (Signed)
Patient reports he is adequate prescription of hydrocodone at home which he uses intermittently for back pain.  Does not require pain medication prescription at this time   Delman Kitten, MD 11/12/19 1539

## 2019-11-12 NOTE — ED Provider Notes (Signed)
-----------------------------------------   3:50 PM on 11/12/2019 -----------------------------------------  Patient has eaten and is drinking.  Patient states his pain is much better at this time.  Patient has Vicodin at home.  We will discharge with a nausea medication.  Dr. Erlene Quan will follow up with the patient tomorrow in the office.  I discussed return precautions.   Harvest Dark, MD 11/12/19 [phone]

## 2019-11-12 NOTE — ED Notes (Signed)
Pt c/o abdominal pain, left lower quad.  Pt states he has not been able to eat since Friday, tried enema last night, states he got 2 walnut like stools out.

## 2019-11-12 NOTE — ED Provider Notes (Signed)
[facility] Emergency Department Provider Note   ____________________________________________   First MD Initiated Contact with Patient 11/12/19 1255     (approximate)  I have reviewed the triage vital signs and the nursing notes.   HISTORY  Chief Complaint Abdominal Pain and Nausea    HPI Elijah Peters is a 79 y.o. male here for evaluation of abdominal pain  Patient reports Friday morning he started having pain discomfort in his abdomen primarily just left of his bellybutton.  Since then he has had a feeling like he cannot defecate.  He is vomited once today.  He has not eaten anything for today.  Reports he feels very full, stomach seems swollen.  Had given himself an enema and had to "walnut" size stools come out, but not passing gas.  No history of abdominal problems.  Normally very regular.  Denies pain in the rectal area   Past Medical History:  Diagnosis Date  . CAD (coronary artery disease)   . Cancer (HCC)    LYMPHOMA/ TUMOR LEFT SINUS  . Cataract   . GERD (gastroesophageal reflux disease)   . Heart murmur   . History of nuclear stress test 06/2012   normal pattern of perfusion; low risk scan  . Hyperlipidemia   . Hypertension   . LVH (left ventricular hypertrophy)    mild, concentric  . Mild aortic sclerosis   . Myocardial infarction ([place]) 08/1996   non-Q-wave inferolateral   . Non Hodgkin's lymphoma ([place])   . Peyronie's disease   . Prostate cancer [facility])     Patient Active Problem List   Diagnosis Date Noted  . CAD in native artery 03/01/2015  . Benign enlargement of prostate 03/01/2015  . Narrowing of intervertebral disc space 03/01/2015  . Contracture of palmar fascia ([place]ren's) 03/01/2015  . Essential (primary) hypertension 03/01/2015  . Acid reflux 03/01/2015  . H/O acute myocardial infarction 03/01/2015  . CA of prostate ([place]) 03/01/2015  . Hypercholesterolemia without hypertriglyceridemia 03/01/2015  . Prostate  CA ([place]) 05/24/2014  . CAD (coronary artery disease) 04/29/2013  . HTN (hypertension) 04/29/2013  . Hyperlipidemia with target LDL less than 70 04/29/2013  . Aortic valve sclerosis 04/29/2013    Past Surgical History:  Procedure Laterality Date  . CARDIAC CATHETERIZATION  1992   PTCA to LAD, in [place]  . CATARACT EXTRACTION  08/06/2010   right eye  . CATARACT EXTRACTION W/PHACO Left 09/23/2016   Procedure: CATARACT EXTRACTION PHACO AND INTRAOCULAR LENS PLACEMENT (IOC);  Surgeon: Estill Cotta, MD;  Location: [facility] ORS;  Service: Ophthalmology;  Laterality: Left;  [place] 1:38.5 AP% 25.3 CDE 48.16 Fluid pack lot # [id number]  . CORONARY ANGIOPLASTY WITH STENT PLACEMENT  1998   L circumflex - 3.0x23.9x9 bare metal stent  . EYE SURGERY  2011   Cataracts removed.  Marland Kitchen HEMORROIDECTOMY    . HERNIA REPAIR  2004  . lymphoma removed    . PROSTATE SURGERY  03/2009   seed implant due to prostate cancer  . TONSILLECTOMY  childhood  . TRANSTHORACIC ECHOCARDIOGRAM  09/01/2011   EF=>55%; mild conc LVH; borderline RV enlargement; LA mildly dilated; mild mitral annular calcif; mild-mod MR; RV systolic pressure elevated; AV mildly sclerotic; mild AV regurg; aortic root sclerosis/calcif    Prior to Admission medications   Medication Sig Start Date End Date Taking? Authorizing Provider  aspirin 81 MG tablet Take 81 mg by mouth daily.  07/01/11  Yes [provider]  icosapent Ethyl (VASCEPA) 1 g capsule Take  2 capsules (2 g total) by mouth 2 (two) times daily. 08/22/19  Yes Almyra Deforest, PA  metoprolol succinate (TOPROL-XL) 50 MG 24 hr tablet Take 1 tablet (50 mg total) by mouth every other day. Alternate taking 50 mg (1 tablet) daily and 25 mg (1/2 tablet) daily 09/20/19  Yes Troy Sine, MD  ramipril (ALTACE) 10 MG capsule TAKE 1 CAPSULE DAILY 03/23/19  Yes Troy Sine, MD  Vitamin D, Cholecalciferol, 10 MCG (400 UNIT) CAPS Take 1 tablet by mouth daily as needed.   Yes [provider]    atorvastatin (LIPITOR) 40 MG tablet Take 1 tablet (40 mg total) by mouth at bedtime. MUST KEEP APPOINTMENT 01/10/19 WITH DR Claiborne Billings FOR FUTURE REFILLS Patient not taking: Reported on 11/12/2019 10/28/18   Troy Sine, MD  B COMPLEX VITAMINS PO Take 1 tablet by mouth daily as needed (energy).     [provider]  cyclobenzaprine (FLEXERIL) 10 MG tablet Take 1 tablet (10 mg total) by mouth daily as needed for muscle spasms. Patient not taking: Reported on 11/12/2019 02/16/19   Jerrol Banana., MD  ezetimibe (ZETIA) 10 MG tablet Take 1 tablet (10 mg total) by mouth at bedtime. MUST KEEP APPOINTMENT 01/10/19 WITH DR Claiborne Billings FOR FUTURE REFILLS Patient not taking: Reported on 11/12/2019 10/28/18   Troy Sine, MD  HYDROcodone-acetaminophen (NORCO/VICODIN) 5-325 MG tablet Take 1 tablet by mouth daily as needed for moderate pain. Patient not taking: Reported on 11/12/2019 02/16/19   Jerrol Banana., MD  Potassium 99 MG TABS Take 1 tablet by mouth daily as needed (cramps).     [provider]    Allergies Niacin and related and Niacin  Family History  Problem Relation Age of Onset  . Emphysema Father   . COPD Father   . Stroke Mother   . Hypertension Brother   . Hyperlipidemia Brother   . Heart disease Brother        CAD    Social History Social History   Tobacco Use  . Smoking status: Former Smoker    Years: 9.00    Types: Cigarettes    Quit date: 08/25/1963    Years since quitting: 56.2  . Smokeless tobacco: Never Used  Substance Use Topics  . Alcohol use: Yes    Alcohol/week: 2.0 - 3.0 standard drinks    Types: 2 - 3 Glasses of wine per week  . Drug use: No    Review of Systems Constitutional: No fever/chills or Covid exposure Eyes: No visual changes. ENT: No sore throat. Cardiovascular: Denies chest pain. Respiratory: Denies shortness of breath. Gastrointestinal: See HPI Genitourinary: Negative for dysuria. Musculoskeletal: Negative for back  pain. Skin: Negative for rash. Neurological: Negative for headaches, areas of focal weakness or numbness.    ____________________________________________   PHYSICAL EXAM:  VITAL SIGNS: ED Triage Vitals  Enc Vitals Group     BP 11/12/19 1217 (!) 187/98     Pulse Rate 11/12/19 1217 76     Resp 11/12/19 1217 18     Temp 11/12/19 1217 98.2 F (36.8 C)     Temp Source 11/12/19 1217 Oral     SpO2 11/12/19 1217 96 %     Weight 11/12/19 1217 178 lb (80.7 kg)     Height 11/12/19 1217 5\' 10"  (1.778 m)     Head Circumference --      Peak Flow --      Pain Score 11/12/19 1223 6  Pain Loc --      Pain Edu? --      Excl. in [place]? --     Constitutional: Alert and oriented.  Laying on his side, appears uncomfortable.  In no acute distress.  Very pleasant.  Appears in some pain Eyes: Conjunctivae are normal. Head: Atraumatic. Nose: No congestion/rhinnorhea. Mouth/Throat: Mucous membranes are moist. Neck: No stridor.  Cardiovascular: Normal rate, regular rhythm. Grossly normal heart sounds.  Good peripheral circulation. Respiratory: Normal respiratory effort.  No retractions. Lungs CTAB. Gastrointestinal: Soft somewhat tympanic.  Some mild tenderness throughout, but does report focal or increase tenderness primarily in the left lower quadrant.  Rectal exam performed with nurse Caryl Pina, rectum and rectal vault are empty, no fecal impaction is palpable.  No black or bloody stool. Musculoskeletal: No lower extremity tenderness nor edema. Neurologic:  Normal speech and language. No gross focal neurologic deficits are appreciated.  Skin:  Skin is warm, dry and intact. No rash noted. Psychiatric: Mood and affect are normal. Speech and behavior are normal.  ____________________________________________   LABS (all labs ordered are listed, but only abnormal results are displayed)  Labs Reviewed  COMPREHENSIVE METABOLIC PANEL - Abnormal; Notable for the following components:      Result Value    Glucose, Bld 152 (*)    Total Bilirubin 1.9 (*)    GFR calc non Af Amer 59 (*)    All other components within normal limits  URINALYSIS, COMPLETE (UACMP) WITH MICROSCOPIC - Abnormal; Notable for the following components:   Color, Urine YELLOW (*)    APPearance CLEAR (*)    Ketones, ur 5 (*)    Protein, ur 30 (*)    All other components within normal limits  LIPASE, BLOOD  CBC   ____________________________________________  EKG   ____________________________________________  RADIOLOGY  CT ABDOMEN PELVIS W CONTRAST  Result Date: 11/12/2019 CLINICAL DATA:  Acute lower abdominal pain. EXAM: CT ABDOMEN AND PELVIS WITH CONTRAST TECHNIQUE: Multidetector CT imaging of the abdomen and pelvis was performed using the standard protocol following bolus administration of intravenous contrast. CONTRAST:  124mL OMNIPAQUE IOHEXOL 300 MG/ML  SOLN COMPARISON:  None. FINDINGS: Lower chest: No acute abnormality. Hepatobiliary: Large solitary gallstone is noted in neck of the gallbladder. No inflammation is noted. No biliary dilatation is noted. Liver is unremarkable. Pancreas: Unremarkable. No pancreatic ductal dilatation or surrounding inflammatory changes. Spleen: Normal in size without focal abnormality. Adrenals/Urinary Tract: Adrenal glands appear normal. Right kidney and ureter are unremarkable. Urinary bladder appears normal. Exophytic cyst is seen arising from upper pole of left kidney. Mild left hydroureteronephrosis is noted secondary to 7 mm calculus at the left ureterovesical junction. Stomach/Bowel: The stomach appears normal. There is no evidence of bowel obstruction or inflammation. The appendix is not visualized. Vascular/Lymphatic: Aortic atherosclerosis. No enlarged abdominal or pelvic lymph nodes. Reproductive: Status post prostatic brachytherapy seed placement. Other: No abdominal wall hernia or abnormality. No abdominopelvic ascites. Musculoskeletal: No acute or significant osseous  findings. IMPRESSION: Large solitary gallstone is noted without evidence of cholecystitis. Mild left hydroureteronephrosis is noted secondary to 7 mm calculus at the left ureterovesical junction. Aortic Atherosclerosis (ICD10-I70.0). Electronically Signed   By: Marijo Conception M.D.   On: 11/12/2019 13:56     CT scan reviewed, most notable for hydronephrosis mild with 7 mm calculus at the left UVJ. ____________________________________________   PROCEDURES  Procedure(s) performed: None  Procedures  Critical Care performed: No  ____________________________________________   INITIAL IMPRESSION / ASSESSMENT AND PLAN / ED COURSE  Pertinent labs & imaging results that were available during my care of the patient were reviewed by me and considered in my medical decision making (see chart for details).   Differential diagnosis includes but is not limited to, abdominal perforation, aortic dissection, cholecystitis, appendicitis, diverticulitis, colitis, esophagitis/gastritis, kidney stone, pyelonephritis, urinary tract infection, aortic aneurysm. All are considered in decision and treatment plan. Based upon the patient's presentation and risk factors, I am concerned about possible obstructive or other intra-abdominal etiologies.  He does not have any obvious infectious symptoms from clinical history or exam.  We will proceed with CT scan.      Clinical Course as of Nov 11 1537  Sun Nov 12, 2019  1434 Patient reports his pain has improved.  Nausea improving though he did vomit once just after CT scan.  Currently receiving IV fluid bolus.  Pain has improved.  Have paged Dr. Erlene Quan to discuss   [MQ]  1443 Ct abd/plv discussed with Dr. Nyoka Cowden. No AAA. L sided kidney stone.    [MQ]  1446 Case reviewed with Dr. Erlene Quan, advises if this symptoms such as pain and nausea are well controlled patient may be discharged and they can follow-up with the patient in their office tomorrow.   [MQ]  1507  Creatinine: 1.17 [KP]    Clinical Course User Index [KP] Harvest Dark, MD [MQ] Delman Kitten, MD    ----------------------------------------- 3:38 PM on 11/12/2019 -----------------------------------------  Ongoing care assigned to Dr. Nelva Bush.  Follow-up on reassessment the patient pain control, p.o. challenge ____________________________________________   FINAL CLINICAL IMPRESSION(S) / ED DIAGNOSES  Final diagnoses:  Kidney stone on left side        Note:  This document was prepared using Dragon voice recognition software and may include unintentional dictation errors       Delman Kitten, MD 11/12/19 1539

## 2019-11-12 NOTE — ED Triage Notes (Signed)
Pt to ED via POV c/o lower abdominal pain. Worse on the left. Has had pain since Friday. Nausea and dry heaving that started in triage. Pt denies fever or chills. Pt denies diarrhea. Pt states that he has not had a BM since Thursday. Pt states that he gave himself an enema this morning around 2 but was only able to pass a small about of stool.

## 2019-11-12 NOTE — ED Notes (Signed)
Pt given cup for urine sample 

## 2019-11-12 NOTE — Discharge Instructions (Addendum)
Please follow-up with Dr. Erlene Quan tomorrow by calling the number provided for urology tomorrow morning.  Return to the emergency department for any worsening of pain, fever, or any other symptom personally concerning to yourself.

## 2019-11-13 NOTE — Progress Notes (Signed)
11/14/19 3:41 PM   [name] 1940/09/15 [id number]  Referring provider: Jerrol Banana., MD [address] [place],  [place] [address]  Chief Complaint  Patient presents with  . Nephrolithiasis    HPI: Elijah Peters is a 79 y.o. white M with a hx of prostate cancer s/p Brachytherapy in 2010 presents today following an ED visit for the evaluation and management of nephrolithiasis.   He presented to ED on 11/12/19 complaining of right lumbar region abdominal pain onset morning of 11/10/19 with associated vomiting. He was followed up with a CT of Abd/pelvis indicating a large solitary gallstone and mild left hydroureteronephrosis secondary to 7 mm calculus of left UVJ.   His KUB indicated a stone in similar position near the level of bladder in comparison to CT scan.  No other upper tract stones noted.   His last pain episode was during his ED visit for which he was given Morphine. After discharge he has taken Naproxen which provided pain relief. He was not started on Flomax during his visit.    He reports of constipation however has not had a fibrous meal and takes narcotics.   No past history of kidney stones.    He is followed by [facility] for his history of prostate cancer. His last PSA was undetectable on 10/20.  PMH: Past Medical History:  Diagnosis Date  . CAD (coronary artery disease)   . Cancer (HCC)    LYMPHOMA/ TUMOR LEFT SINUS  . Cataract   . GERD (gastroesophageal reflux disease)   . Heart murmur   . History of nuclear stress test 06/2012   normal pattern of perfusion; low risk scan  . Hyperlipidemia   . Hypertension   . LVH (left ventricular hypertrophy)    mild, concentric  . Mild aortic sclerosis   . Myocardial infarction ([place]) 08/1996   non-Q-wave inferolateral   . Non Hodgkin's lymphoma ([place])   . Peyronie's disease   . Prostate cancer [facility])     Surgical History: Past Surgical History:  Procedure Laterality Date   . CARDIAC CATHETERIZATION  1992   PTCA to LAD, in [place]  . CATARACT EXTRACTION  08/06/2010   right eye  . CATARACT EXTRACTION W/PHACO Left 09/23/2016   Procedure: CATARACT EXTRACTION PHACO AND INTRAOCULAR LENS PLACEMENT (IOC);  Surgeon: Estill Cotta, MD;  Location: [facility] ORS;  Service: Ophthalmology;  Laterality: Left;  [place] 1:38.5 AP% 25.3 CDE 48.16 Fluid pack lot # [id number]  . CORONARY ANGIOPLASTY WITH STENT PLACEMENT  1998   L circumflex - 3.0x23.9x9 bare metal stent  . EYE SURGERY  2011   Cataracts removed.  Marland Kitchen HEMORROIDECTOMY    . HERNIA REPAIR  2004  . lymphoma removed    . PROSTATE SURGERY  03/2009   seed implant due to prostate cancer  . TONSILLECTOMY  childhood  . TRANSTHORACIC ECHOCARDIOGRAM  09/01/2011   EF=>55%; mild conc LVH; borderline RV enlargement; LA mildly dilated; mild mitral annular calcif; mild-mod MR; RV systolic pressure elevated; AV mildly sclerotic; mild AV regurg; aortic root sclerosis/calcif    Home Medications:  Allergies as of 11/14/2019      Reactions   Niacin And Related Itching   Niacin Itching      Medication List       Accurate as of November 14, 2019  3:41 PM. If you have any questions, ask your nurse or doctor.        aspirin 81 MG tablet Take 81 mg  by mouth daily.   atorvastatin 40 MG tablet Commonly known as: LIPITOR Take 1 tablet (40 mg total) by mouth at bedtime. MUST KEEP APPOINTMENT 01/10/19 WITH DR Claiborne Billings FOR FUTURE REFILLS   B COMPLEX VITAMINS PO Take 1 tablet by mouth daily as needed (energy).   cyclobenzaprine 10 MG tablet Commonly known as: FLEXERIL Take 1 tablet (10 mg total) by mouth daily as needed for muscle spasms.   ezetimibe 10 MG tablet Commonly known as: Zetia Take 1 tablet (10 mg total) by mouth at bedtime. MUST KEEP APPOINTMENT 01/10/19 WITH DR Claiborne Billings FOR FUTURE REFILLS   HYDROcodone-acetaminophen 5-325 MG tablet Commonly known as: NORCO/VICODIN Take 1 tablet by mouth daily as needed for moderate pain.    icosapent Ethyl 1 g capsule Commonly known as: Vascepa Take 2 capsules (2 g total) by mouth 2 (two) times daily.   metoprolol succinate 50 MG 24 hr tablet Commonly known as: TOPROL-XL Take 1 tablet (50 mg total) by mouth every other day. Alternate taking 50 mg (1 tablet) daily and 25 mg (1/2 tablet) daily   ondansetron 4 MG disintegrating tablet Commonly known as: Zofran ODT Take 1 tablet (4 mg total) by mouth every 6 (six) hours as needed for nausea or vomiting.   Potassium 99 MG Tabs Take 1 tablet by mouth daily as needed (cramps).   ramipril 10 MG capsule Commonly known as: ALTACE TAKE 1 CAPSULE DAILY   tamsulosin 0.4 MG Caps capsule Commonly known as: FLOMAX Take 1 capsule (0.4 mg total) by mouth daily. Started by: Hollice Espy, MD   Vitamin D (Cholecalciferol) 10 MCG (400 UNIT) Caps Take 1 tablet by mouth daily as needed.       Allergies:  Allergies  Allergen Reactions  . Niacin And Related Itching  . Niacin Itching    Family History: Family History  Problem Relation Age of Onset  . Emphysema Father   . COPD Father   . Stroke Mother   . Hypertension Brother   . Hyperlipidemia Brother   . Heart disease Brother        CAD    Social History:  reports that he quit smoking about 56 years ago. His smoking use included cigarettes. He quit after 9.00 years of use. He has never used smokeless tobacco. He reports current alcohol use of about 2.0 - 3.0 standard drinks of alcohol per week. He reports that he does not use drugs.   Physical Exam: BP (!) 159/89   Pulse 76   Ht 5\' 10"  (1.778 m)   Wt 178 lb (80.7 kg)   BMI 25.54 kg/m   Constitutional:  Alert and oriented, No acute distress. HEENT: [place] AT, moist mucus membranes.  Trachea midline, no masses. Cardiovascular: No clubbing, cyanosis, or edema. Respiratory: Normal respiratory effort, no increased work of breathing. Skin: No rashes, bruises or suspicious lesions. Neurologic: Grossly intact, no focal  deficits, moving all 4 extremities. Psychiatric: Normal mood and affect.  Laboratory Data: Lab Results  Component Value Date   CREATININE 1.17 11/12/2019   Urinalysis UA today 3-10 RBC and 11-30 WBC.  Pertinent Imaging: CLINICAL DATA:  Acute lower abdominal pain.  EXAM: CT ABDOMEN AND PELVIS WITH CONTRAST  TECHNIQUE: Multidetector CT imaging of the abdomen and pelvis was performed using the standard protocol following bolus administration of intravenous contrast.  CONTRAST:  144mL OMNIPAQUE IOHEXOL 300 MG/ML  SOLN  COMPARISON:  None.  FINDINGS: Lower chest: No acute abnormality.  Hepatobiliary: Large solitary gallstone is noted in neck of  the gallbladder. No inflammation is noted. No biliary dilatation is noted. Liver is unremarkable.  Pancreas: Unremarkable. No pancreatic ductal dilatation or surrounding inflammatory changes.  Spleen: Normal in size without focal abnormality.  Adrenals/Urinary Tract: Adrenal glands appear normal. Right kidney and ureter are unremarkable. Urinary bladder appears normal. Exophytic cyst is seen arising from upper pole of left kidney. Mild left hydroureteronephrosis is noted secondary to 7 mm calculus at the left ureterovesical junction.  Stomach/Bowel: The stomach appears normal. There is no evidence of bowel obstruction or inflammation. The appendix is not visualized.  Vascular/Lymphatic: Aortic atherosclerosis. No enlarged abdominal or pelvic lymph nodes.  Reproductive: Status post prostatic brachytherapy seed placement.  Other: No abdominal wall hernia or abnormality. No abdominopelvic ascites.  Musculoskeletal: No acute or significant osseous findings.  IMPRESSION: Large solitary gallstone is noted without evidence of cholecystitis.  Mild left hydroureteronephrosis is noted secondary to 7 mm calculus at the left ureterovesical junction.  Aortic Atherosclerosis (ICD10-I70.0).   Electronically  Signed   By: Marijo Conception M.D.   On: 11/12/2019 13:56  I have personally reviewed the images and agree with radiologist interpretation.   I also ordered a KUB this AM, left distal ureteral stone visible in similar unchanged position and conformation  Assessment & Plan:    1. History of prostate cancer [place] 3+4 prostate cancer for which he underwent Brachytherapy in 2010  He is followed by [facility]  PSA remains low and undetectable Due for annual PSA in 10/21  2. Nephrolithiasis / Left distal ureteral calculus KUB indicates stone unchanged from previous CT scan  UA today indicates 3-10 RBC and 11-30 WBC. Sent for urine culture although do no suspect infection, will treated as needed  We discussed various treatment options including active surveillance vs ESWL vs. ureteroscopy, laser lithotripsy, and stent vs continue surviellence. We discussed the risks and benefits of both including bleeding, infection, damage to surrounding structures, efficacy with need for possible further intervention, and need for temporary ureteral stent.  We discussed general stone prevention techniques including drinking plenty water with goal of producing 2.5 L urine daily, increased citric acid intake, avoidance of high oxalate containing foods, and decreased salt intake.  Information about dietary recommendations given today.   Started patient on Flomax and expained the risks and benefits. Explained to pt to stop medication if sone passes or experiences symptoms including dizziness.  He has elected to return in 3 weeks with KUB for further intervention if stone has not passed.  Warning symptoms and indication for more urgent/ emergent evaluation   3. Microscopic hematuria  See 2. Secondary to above.   Return in about 3 weeks (around 12/05/2019) for KUB.  Hollice Espy, MD  [facility] [address], [place], [place] [address] [phone]  I,  Lucas Mallow, am acting as a scribe for Dr. Hollice Espy,  I have reviewed the above documentation for accuracy and completeness, and I agree with the above.   Hollice Espy, MD

## 2019-11-14 ENCOUNTER — Other Ambulatory Visit: Payer: Self-pay | Admitting: Urology

## 2019-11-14 ENCOUNTER — Encounter: Payer: Self-pay | Admitting: Urology

## 2019-11-14 ENCOUNTER — Ambulatory Visit
Admission: RE | Admit: 2019-11-14 | Discharge: 2019-11-14 | Disposition: A | Discharge status: Home / Self Care | Payer: Medicare Other | Source: Ambulatory Visit | Attending: Urology | Admitting: Urology

## 2019-11-14 ENCOUNTER — Other Ambulatory Visit: Payer: Self-pay

## 2019-11-14 ENCOUNTER — Ambulatory Visit (INDEPENDENT_AMBULATORY_CARE_PROVIDER_SITE_OTHER): Payer: Medicare Other | Admitting: Urology

## 2019-11-14 VITALS — BP 159/89 | HR 76 | Ht 70.0 in | Wt 178.0 lb

## 2019-11-14 DIAGNOSIS — C61 Malignant neoplasm of prostate: Secondary | ICD-10-CM | POA: Diagnosis not present

## 2019-11-14 DIAGNOSIS — N200 Calculus of kidney: Secondary | ICD-10-CM | POA: Diagnosis not present

## 2019-11-14 DIAGNOSIS — I2510 Atherosclerotic heart disease of native coronary artery without angina pectoris: Secondary | ICD-10-CM

## 2019-11-14 DIAGNOSIS — N201 Calculus of ureter: Secondary | ICD-10-CM | POA: Diagnosis not present

## 2019-11-14 LAB — URINALYSIS, COMPLETE
Bilirubin, UA: NEGATIVE
Glucose, UA: NEGATIVE
Ketones, UA: NEGATIVE
Leukocytes,UA: NEGATIVE
Nitrite, UA: NEGATIVE
Specific Gravity, UA: 1.025 (ref 1.005–1.030)
Urobilinogen, Ur: 4 mg/dL — ABNORMAL HIGH (ref 0.2–1.0)
pH, UA: 5.5 (ref 5.0–7.5)

## 2019-11-14 LAB — MICROSCOPIC EXAMINATION: Bacteria, UA: NONE SEEN

## 2019-11-14 MED ORDER — TAMSULOSIN HCL 0.4 MG PO CAPS: 0.4000 mg | ORAL_CAPSULE | Freq: Every day | ORAL | 1 refills | Status: DC

## 2019-11-15 ENCOUNTER — Ambulatory Visit: Payer: Medicare Other | Admitting: Urology

## 2019-11-17 ENCOUNTER — Encounter: Payer: Self-pay | Admitting: Physician Assistant

## 2019-11-17 ENCOUNTER — Other Ambulatory Visit: Payer: Self-pay

## 2019-11-17 ENCOUNTER — Ambulatory Visit (INDEPENDENT_AMBULATORY_CARE_PROVIDER_SITE_OTHER): Payer: Medicare Other | Admitting: Physician Assistant

## 2019-11-17 VITALS — BP 147/77 | HR 60 | Temp 97.3°F | Wt 183.8 lb

## 2019-11-17 DIAGNOSIS — L03032 Cellulitis of left toe: Secondary | ICD-10-CM

## 2019-11-17 MED ORDER — DOXYCYCLINE HYCLATE 100 MG PO TABS: 100.0000 mg | ORAL_TABLET | Freq: Two times a day (BID) | ORAL | 0 refills | Status: AC

## 2019-11-17 NOTE — Progress Notes (Signed)
Patient: Elijah Peters Male    DOB: [birth year]   79 y.o.   MRN: [id number] Visit Date: 11/17/2019  Today's Provider: Trinna Post, PA-C   Chief Complaint  Patient presents with  . Toe Pain   Subjective:     Toe Pain  The incident occurred more than 1 week ago. The pain is present in the left toes and right toes (both big toes). The quality of the pain is described as aching. The pain is mild. Associated symptoms include an inability to bear weight. Pertinent negatives include no numbness or tingling. He reports no foreign bodies present. The symptoms are aggravated by weight bearing. Treatments tried: epsom salt soak. The treatment provided no relief.   Patient presents today complaining that both big toes are red and swollen. For the past four - five months his toenails have dislodged and new ones have been growing. No trauma. No fungus. The toes are not painful generally, but most recently left big toe is red and painful and swollen. Denies claudication symptoms.   Allergies  Allergen Reactions  . Niacin And Related Itching  . Niacin Itching     Current Outpatient Medications:  .  aspirin 81 MG tablet, Take 81 mg by mouth daily. , Disp: , Rfl:  .  atorvastatin (LIPITOR) 40 MG tablet, Take 1 tablet (40 mg total) by mouth at bedtime. MUST KEEP APPOINTMENT 01/10/19 WITH DR Claiborne Billings FOR FUTURE REFILLS, Disp: 90 tablet, Rfl: 0 .  B COMPLEX VITAMINS PO, Take 1 tablet by mouth daily as needed (energy). , Disp: , Rfl:  .  cyclobenzaprine (FLEXERIL) 10 MG tablet, Take 1 tablet (10 mg total) by mouth daily as needed for muscle spasms., Disp: 90 tablet, Rfl: 0 .  ezetimibe (ZETIA) 10 MG tablet, Take 1 tablet (10 mg total) by mouth at bedtime. MUST KEEP APPOINTMENT 01/10/19 WITH DR Claiborne Billings FOR FUTURE REFILLS, Disp: 90 tablet, Rfl: 0 .  HYDROcodone-acetaminophen (NORCO/VICODIN) 5-325 MG tablet, Take 1 tablet by mouth daily as needed for moderate pain., Disp: 30 tablet, Rfl: 0 .   icosapent Ethyl (VASCEPA) 1 g capsule, Take 2 capsules (2 g total) by mouth 2 (two) times daily., Disp: 120 capsule, Rfl: 6 .  metoprolol succinate (TOPROL-XL) 50 MG 24 hr tablet, Take 1 tablet (50 mg total) by mouth every other day. Alternate taking 50 mg (1 tablet) daily and 25 mg (1/2 tablet) daily, Disp: 72 tablet, Rfl: 3 .  ondansetron (ZOFRAN ODT) 4 MG disintegrating tablet, Take 1 tablet (4 mg total) by mouth every 6 (six) hours as needed for nausea or vomiting., Disp: 20 tablet, Rfl: 0 .  Potassium 99 MG TABS, Take 1 tablet by mouth daily as needed (cramps). , Disp: , Rfl:  .  ramipril (ALTACE) 10 MG capsule, TAKE 1 CAPSULE DAILY, Disp: 90 capsule, Rfl: 3 .  tamsulosin (FLOMAX) 0.4 MG CAPS capsule, Take 1 capsule (0.4 mg total) by mouth daily., Disp: 30 capsule, Rfl: 1 .  Vitamin D, Cholecalciferol, 10 MCG (400 UNIT) CAPS, Take 1 tablet by mouth daily as needed., Disp: , Rfl:   Review of Systems  Neurological: Negative for tingling and numbness.    Social History   Tobacco Use  . Smoking status: Former Smoker    Years: 9.00    Types: Cigarettes    Quit date: 08/25/1963    Years since quitting: 56.2  . Smokeless tobacco: Never Used  Substance Use Topics  . Alcohol use: Yes  Alcohol/week: 2.0 - 3.0 standard drinks    Types: 2 - 3 Glasses of wine per week      Objective:   There were no vitals taken for this visit. There were no vitals filed for this visit.There is no height or weight on file to calculate BMI.   Physical Exam Cardiovascular:     Rate and Rhythm: Normal rate.  Pulmonary:     Effort: Pulmonary effort is normal.  Feet:     Right foot:     Skin integrity: Erythema and warmth present.     Comments: There is a distinct erythematous demarcation extending distally from his left great toe proximally towards his nail bed. This area is swollen and tender to palpation. There is some dried purulence at the nail bed. Right great toe does not display erythema. Both  great toes are midway through growing new nails. The remainder of his toes bilaterally are mottled, purple in appearance and slightly cold. He has many varicose veins in his lower extremities.  Skin:    General: Skin is warm.  Neurological:     Mental Status: He is oriented to person, place, and time. Mental status is at baseline.  Psychiatric:        Mood and Affect: Mood normal.        Behavior: Behavior normal.      No results found for any visits on 11/17/19.     Assessment & Plan    1. Paronychia of great toe, left  Treat as below. Counseled patient he would likely to do well to see a vascular surgeon about his circulatory issues in his lower extremities. He would like to think about this. He is seeing PCP next Wednesday and can follow up then.  - doxycycline (VIBRA-TABS) 100 MG tablet; Take 1 tablet (100 mg total) by mouth 2 (two) times daily for 7 days.  Dispense: 14 tablet; Refill: 0  The entirety of the information documented in the History of Present Illness, Review of Systems and Physical Exam were personally obtained by me. Portions of this information were initially documented by [facility] and reviewed by me for thoroughness and accuracy.      Trinna Post, PA-C  [facility]

## 2019-11-17 NOTE — Patient Instructions (Signed)
Vascular Surgeon - Dr. Lucky Cowboy and Dr. Delana Meyer - [facility]    Paronychia Paronychia is an infection of the skin. It happens near a fingernail or toenail. It may cause pain and swelling around the nail. In some cases, a fluid-filled bump (abscess) can form near or under the nail. Usually, this condition is not serious, and it clears up with treatment. Follow these instructions at home: Wound care  Keep the affected area clean.  Soak the fingers or toes in warm water as told by your doctor. You may be told to do this for 20 minutes, 2-3 times a day.  Keep the area dry when you are not soaking it.  Do not try to drain a fluid-filled bump on your own.  Follow instructions from your doctor about how to take care of the affected area. Make sure you: ? Wash your hands with soap and water before you change your bandage (dressing). If you cannot use soap and water, use hand sanitizer. ? Change your bandage as told by your doctor.  If you had a fluid-filled bump and your doctor drained it, check the area every day for signs of infection. Check for: ? Redness, swelling, or pain. ? Fluid or blood. ? Warmth. ? Pus or a bad smell. Medicines   Take over-the-counter and prescription medicines only as told by your doctor.  If you were prescribed an antibiotic medicine, take it as told by your doctor. Do not stop taking it even if you start to feel better. General instructions  Avoid touching any chemicals.  Do not pick at the affected area. Prevention  To prevent this condition from happening again: ? Wear rubber gloves when putting your hands in water for washing dishes or other tasks. ? Wear gloves if your hands might touch cleaners or chemicals. ? Avoid injuring your nails or fingertips. ? Do not bite your nails or tear hangnails. ? Do not cut your nails very short. ? Do not cut the skin at the base and sides of the nail (cuticles). ? Use clean nail clippers or scissors  when trimming nails. Contact a doctor if:  You feel worse.  You do not get better.  You have more fluid, blood, or pus coming from the affected area.  Your finger or knuckle is swollen or is hard to move. Get help right away if you have:  A fever or chills.  Redness spreading from the affected area.  Pain in a joint or muscle. Summary  Paronychia is an infection of the skin. It happens near a fingernail or toenail.  This condition may cause pain and swelling around the nail.  Soak the fingers or toes in warm water as told by your doctor.  Usually, this condition is not serious, and it clears up with treatment. This information is not intended to replace advice given to you by your health care provider. Make sure you discuss any questions you have with your health care provider. Document Revised: 08/27/2017 Document Reviewed: 08/23/2017 Elsevier Patient Education  2020 [employer].

## 2019-11-19 LAB — CULTURE, URINE COMPREHENSIVE

## 2019-11-20 ENCOUNTER — Telehealth: Payer: Self-pay | Admitting: Physician Assistant

## 2019-11-20 MED ORDER — CIPROFLOXACIN HCL 250 MG PO TABS: 250.0000 mg | ORAL_TABLET | Freq: Two times a day (BID) | ORAL | 0 refills | Status: AC

## 2019-11-20 NOTE — Telephone Encounter (Signed)
Patient notified, voiced understanding.

## 2019-11-20 NOTE — Telephone Encounter (Signed)
Please contact the patient and inform him that his urine culture came back positive for a mild urinary tract infection.  Given that he is attempting to pass a stone at this time, I would like to proceed with antibiotics.  I have sent a prescription for ciprofloxacin 250 mg twice daily x7 days to the [facility] in [place].  Please counsel him to contact our office immediately or proceed to the emergency room if outside office hours if he develops fever, chills, nausea, vomiting, or uncontrollable flank pain.

## 2019-11-20 NOTE — Progress Notes (Signed)
Patient: Elijah Peters Male    DOB: 22-Jan-1941   78 y.o.   MRN: [id number] Visit Date: 11/21/2019  Today's Provider: [name], MD   Chief Complaint  Patient presents with  . Hospitalization Follow-up   Subjective:     HPI    Follow up ER visit He has had no further pain since the ED in hopes he is passed a stone.  His foot is improved since being on doxycycline and the Cipro was added. Patient was seen in ER for Abdominal Pain and Nausea on 11/12/2019. He was treated for; Kidney stone on left side. Treatment for this included CT abdominal scan. labs He reports excellent compliance with treatment. He reports this condition is Improved.  ------------------------------------------------------------------------------------  Patient would like dr. To view both big toes from where he has an infection and was seen on Friday.   Allergies  Allergen Reactions  . Niacin And Related Itching  . Niacin Itching     Current Outpatient Medications:  .  aspirin 81 MG tablet, Take 81 mg by mouth daily. , Disp: , Rfl:  .  atorvastatin (LIPITOR) 40 MG tablet, Take 1 tablet (40 mg total) by mouth at bedtime. MUST KEEP APPOINTMENT 01/10/19 WITH DR Claiborne Billings FOR FUTURE REFILLS, Disp: 90 tablet, Rfl: 0 .  B COMPLEX VITAMINS PO, Take 1 tablet by mouth daily as needed (energy). , Disp: , Rfl:  .  ciprofloxacin (CIPRO) 250 MG tablet, Take 1 tablet (250 mg total) by mouth 2 (two) times daily for 7 days., Disp: 14 tablet, Rfl: 0 .  cyclobenzaprine (FLEXERIL) 10 MG tablet, Take 1 tablet (10 mg total) by mouth daily as needed for muscle spasms., Disp: 90 tablet, Rfl: 0 .  doxycycline (VIBRA-TABS) 100 MG tablet, Take 1 tablet (100 mg total) by mouth 2 (two) times daily for 7 days., Disp: 14 tablet, Rfl: 0 .  ezetimibe (ZETIA) 10 MG tablet, Take 1 tablet (10 mg total) by mouth at bedtime. MUST KEEP APPOINTMENT 01/10/19 WITH DR Claiborne Billings FOR FUTURE REFILLS, Disp: 90 tablet, Rfl: 0 .   HYDROcodone-acetaminophen (NORCO/VICODIN) 5-325 MG tablet, Take 1 tablet by mouth daily as needed for moderate pain., Disp: 30 tablet, Rfl: 0 .  icosapent Ethyl (VASCEPA) 1 g capsule, Take 2 capsules (2 g total) by mouth 2 (two) times daily., Disp: 120 capsule, Rfl: 6 .  metoprolol succinate (TOPROL-XL) 50 MG 24 hr tablet, Take 1 tablet (50 mg total) by mouth every other day. Alternate taking 50 mg (1 tablet) daily and 25 mg (1/2 tablet) daily, Disp: 72 tablet, Rfl: 3 .  ondansetron (ZOFRAN ODT) 4 MG disintegrating tablet, Take 1 tablet (4 mg total) by mouth every 6 (six) hours as needed for nausea or vomiting. (Patient not taking: Reported on 11/17/2019), Disp: 20 tablet, Rfl: 0 .  Potassium 99 MG TABS, Take 1 tablet by mouth daily as needed (cramps). , Disp: , Rfl:  .  ramipril (ALTACE) 10 MG capsule, TAKE 1 CAPSULE DAILY, Disp: 90 capsule, Rfl: 3 .  tamsulosin (FLOMAX) 0.4 MG CAPS capsule, Take 1 capsule (0.4 mg total) by mouth daily., Disp: 30 capsule, Rfl: 1 .  Vitamin D, Cholecalciferol, 10 MCG (400 UNIT) CAPS, Take 1 tablet by mouth daily as needed., Disp: , Rfl:   Review of Systems  Constitutional: Negative for appetite change, chills and fever.  HENT: Negative.   Eyes: Negative.   Respiratory: Negative for chest tightness, shortness of breath and wheezing.   Cardiovascular:  Negative for chest pain and palpitations.  Gastrointestinal: Negative for abdominal pain, nausea and vomiting.  Endocrine: Negative.   Genitourinary: Positive for flank pain.  Skin: Positive for wound.  Allergic/Immunologic: Negative.   Hematological: Negative.   Psychiatric/Behavioral: Negative.     Social History   Tobacco Use  . Smoking status: Former Smoker    Years: 9.00    Types: Cigarettes    Quit date: 08/25/1963    Years since quitting: 56.2  . Smokeless tobacco: Never Used  Substance Use Topics  . Alcohol use: Yes    Alcohol/week: 2.0 - 3.0 standard drinks    Types: 2 - 3 Glasses of wine per  week      Objective:   There were no vitals taken for this visit. There were no vitals filed for this visit.There is no height or weight on file to calculate BMI.  Physical exam Weight is 185, temperature 97.1, blood pressure 159/79, heart rate 60  In general he is a well-nourished well-developed male in no acute distress.  He appears younger than his age of 36. The oral regular rate and rhythm lungs clear abdomen soft no CVA tenderness  The right great toe has a about 5 mm of erythema.  I cannot express anything from the area.  It is minimally tender if I press firmly on the just behind the nail.   No results found for any visits on 11/21/19.     Assessment & Plan     1. Paronychia of great toe, left Finish antibiotics.  Patient and wife states it is improving.  Have offered to incise it but he declines.  I will have him follow-up next week with Simona Huh Chrismon.  2. Kidney stone Follow-up with Dr. Erlene Quan.  3. CAD in native artery All risk factors treated  4h/o . Prostate CA ([place])   5. H/o  Hodgkin lymphoma of lymph nodes of neck, unspecified Hodgkin lymphoma type ([place]) Remission.     Shardee Dieu Cranford Mon, MD  [facility]

## 2019-11-20 NOTE — Telephone Encounter (Signed)
He should take both. The bacteria in his urine is resistant to Doxy.

## 2019-11-20 NOTE — Telephone Encounter (Signed)
Patient was seen by his PCP prescribed Doxycyline for 14 days for Paronychia of his toes. He started RX on 3/25. Does he need to start Cipro? Please advise

## 2019-11-21 ENCOUNTER — Other Ambulatory Visit: Payer: Self-pay

## 2019-11-21 ENCOUNTER — Ambulatory Visit (INDEPENDENT_AMBULATORY_CARE_PROVIDER_SITE_OTHER): Payer: Medicare Other | Admitting: Family Medicine

## 2019-11-21 ENCOUNTER — Encounter: Payer: Self-pay | Admitting: Family Medicine

## 2019-11-21 VITALS — BP 159/79 | HR 60 | Temp 97.1°F | Ht 70.0 in | Wt 185.0 lb

## 2019-11-21 DIAGNOSIS — N200 Calculus of kidney: Secondary | ICD-10-CM

## 2019-11-21 DIAGNOSIS — L03032 Cellulitis of left toe: Secondary | ICD-10-CM

## 2019-11-21 DIAGNOSIS — C8191 Hodgkin lymphoma, unspecified, lymph nodes of head, face, and neck: Secondary | ICD-10-CM | POA: Diagnosis not present

## 2019-11-21 DIAGNOSIS — C61 Malignant neoplasm of prostate: Secondary | ICD-10-CM

## 2019-11-21 DIAGNOSIS — I2510 Atherosclerotic heart disease of native coronary artery without angina pectoris: Secondary | ICD-10-CM | POA: Diagnosis not present

## 2019-11-28 ENCOUNTER — Other Ambulatory Visit: Payer: Self-pay

## 2019-11-28 ENCOUNTER — Encounter: Payer: Self-pay | Admitting: Family Medicine

## 2019-11-28 ENCOUNTER — Ambulatory Visit (INDEPENDENT_AMBULATORY_CARE_PROVIDER_SITE_OTHER): Payer: Medicare Other | Admitting: Family Medicine

## 2019-11-28 VITALS — BP 126/67 | HR 64 | Temp 97.3°F | Resp 18 | Wt 184.0 lb

## 2019-11-28 DIAGNOSIS — L03032 Cellulitis of left toe: Secondary | ICD-10-CM | POA: Diagnosis not present

## 2019-11-28 DIAGNOSIS — I2510 Atherosclerotic heart disease of native coronary artery without angina pectoris: Secondary | ICD-10-CM | POA: Diagnosis not present

## 2019-11-28 NOTE — Progress Notes (Signed)
Established patient visit      Patient: Elijah Peters   DOB: 1940-10-04   79 y.o. Male  MRN: [id number] Visit Date: 11/28/2019  Today's healthcare provider: Vernie Murders, PA  Subjective:    Chief Complaint  Patient presents with  . Follow-up   HPI  Follow up for Paronychia of great toe, left  The patient was last seen for this 11/21/2019 by Dr. Rosanna Randy. Changes made at last visit include none; patient was advised to finish antibiotics and follow up in 1 week.  He reports good compliance with treatment. He feels that condition is Improved. He is not having side effects.   ------------------------------------------------------------------------------------    Patient Active Problem List   Diagnosis Date Noted  . CAD in native artery 03/01/2015  . Benign enlargement of prostate 03/01/2015  . Narrowing of intervertebral disc space 03/01/2015  . Contracture of palmar fascia (Dupuytren's) 03/01/2015  . Essential (primary) hypertension 03/01/2015  . Acid reflux 03/01/2015  . H/O acute myocardial infarction 03/01/2015  . CA of prostate ([place]) 03/01/2015  . Hypercholesterolemia without hypertriglyceridemia 03/01/2015  . Prostate CA ([place]) 05/24/2014  . CAD (coronary artery disease) 04/29/2013  . HTN (hypertension) 04/29/2013  . Hyperlipidemia with target LDL less than 70 04/29/2013  . Aortic valve sclerosis 04/29/2013   Past Surgical History:  Procedure Laterality Date  . CARDIAC CATHETERIZATION  1992   PTCA to LAD, in [place]  . CATARACT EXTRACTION  08/06/2010   right eye  . CATARACT EXTRACTION W/PHACO Left 09/23/2016   Procedure: CATARACT EXTRACTION PHACO AND INTRAOCULAR LENS PLACEMENT (IOC);  Surgeon: Estill Cotta, MD;  Location: [facility] ORS;  Service: Ophthalmology;  Laterality: Left;  [place] 1:38.5 AP% 25.3 CDE 48.16 Fluid pack lot # [id number]  . CORONARY ANGIOPLASTY WITH STENT PLACEMENT  1998   L circumflex - 3.0x23.9x9 bare metal stent  . EYE SURGERY  2011    Cataracts removed.  Marland Kitchen HEMORROIDECTOMY    . HERNIA REPAIR  2004  . lymphoma removed    . PROSTATE SURGERY  03/2009   seed implant due to prostate cancer  . TONSILLECTOMY  childhood  . TRANSTHORACIC ECHOCARDIOGRAM  09/01/2011   EF=>55%; mild conc LVH; borderline RV enlargement; LA mildly dilated; mild mitral annular calcif; mild-mod MR; RV systolic pressure elevated; AV mildly sclerotic; mild AV regurg; aortic root sclerosis/calcif   Allergies  Allergen Reactions  . Niacin And Related Itching  . Niacin Itching       Medications: Outpatient Medications Prior to Visit  Medication Sig  . aspirin 81 MG tablet Take 81 mg by mouth daily.   Marland Kitchen atorvastatin (LIPITOR) 40 MG tablet Take 1 tablet (40 mg total) by mouth at bedtime. MUST KEEP APPOINTMENT 01/10/19 WITH DR Claiborne Billings FOR FUTURE REFILLS  . B COMPLEX VITAMINS PO Take 1 tablet by mouth daily as needed (energy).   . cyclobenzaprine (FLEXERIL) 10 MG tablet Take 1 tablet (10 mg total) by mouth daily as needed for muscle spasms.  Marland Kitchen ezetimibe (ZETIA) 10 MG tablet Take 1 tablet (10 mg total) by mouth at bedtime. MUST KEEP APPOINTMENT 01/10/19 WITH DR Claiborne Billings FOR FUTURE REFILLS  . HYDROcodone-acetaminophen (NORCO/VICODIN) 5-325 MG tablet Take 1 tablet by mouth daily as needed for moderate pain.  Marland Kitchen icosapent Ethyl (VASCEPA) 1 g capsule Take 2 capsules (2 g total) by mouth 2 (two) times daily.  . metoprolol succinate (TOPROL-XL) 50 MG 24 hr tablet Take 1 tablet (50 mg total) by mouth every other day. Alternate taking 50 mg (1  tablet) daily and 25 mg (1/2 tablet) daily  . ondansetron (ZOFRAN ODT) 4 MG disintegrating tablet Take 1 tablet (4 mg total) by mouth every 6 (six) hours as needed for nausea or vomiting.  . Potassium 99 MG TABS Take 1 tablet by mouth daily as needed (cramps).   . ramipril (ALTACE) 10 MG capsule TAKE 1 CAPSULE DAILY  . tamsulosin (FLOMAX) 0.4 MG CAPS capsule Take 1 capsule (0.4 mg total) by mouth daily.  . Vitamin D, Cholecalciferol,  10 MCG (400 UNIT) CAPS Take 1 tablet by mouth daily as needed.   No facility-administered medications prior to visit.    Review of Systems  Constitutional: Negative for appetite change, chills and fever.  Respiratory: Negative for chest tightness, shortness of breath and wheezing.   Cardiovascular: Negative for chest pain and palpitations.  Gastrointestinal: Negative for abdominal pain, nausea and vomiting.        Objective:    BP 126/67 (BP Location: Right Arm, Patient Position: Sitting, Cuff Size: Large)   Pulse 64   Temp (!) 97.3 F (36.3 C) (Temporal)   Resp 18   Wt 184 lb (83.5 kg)   BMI 26.40 kg/m    Physical Exam Constitutional:      General: He is not in acute distress.    Appearance: He is well-developed.  HENT:     Head: Normocephalic and atraumatic.     Right Ear: Hearing normal.     Left Ear: Hearing normal.     Nose: Nose normal.  Eyes:     General: Lids are normal. No scleral icterus.       Right eye: No discharge.        Left eye: No discharge.     Conjunctiva/sclera: Conjunctivae normal.  Pulmonary:     Effort: Pulmonary effort is normal. No respiratory distress.  Musculoskeletal:        General: Normal range of motion.  Skin:    Findings: No lesion or rash.     Comments: Pinkness around the left great toenail without drainage and minimal tenderness today. Transverse ridge on both great toenails with opacity beneath them. No significant thickening of nails or crumbling of nails.  Neurological:     Mental Status: He is alert and oriented to person, place, and time.  Psychiatric:        Speech: Speech normal.        Behavior: Behavior normal.        Thought Content: Thought content normal.        Assessment & Plan:    1. Paronychia of great toe, left Only slight pinkness remains around the left great toenail since taking Doxycycline and Cipro. Feeling improved with little discomfort remaining. Thick transverse ridge of both great toenails.  Cuticles are missing but no drainage. Continue Epsom Saltwater soaks and recheck if no further improvement or worsening. May need podiatry referral with early hammer toe deformity developing in the right toes.      Vernie Murders, [place] [phone] (phone) [phone] (fax)  [place]

## 2019-12-05 ENCOUNTER — Other Ambulatory Visit: Payer: Self-pay

## 2019-12-05 ENCOUNTER — Ambulatory Visit (INDEPENDENT_AMBULATORY_CARE_PROVIDER_SITE_OTHER): Payer: Medicare Other | Admitting: Physician Assistant

## 2019-12-05 ENCOUNTER — Ambulatory Visit
Admission: RE | Admit: 2019-12-05 | Discharge: 2019-12-05 | Disposition: A | Discharge status: Home / Self Care | Payer: Medicare Other | Source: Ambulatory Visit | Attending: Urology | Admitting: Urology

## 2019-12-05 VITALS — BP 142/85 | HR 93 | Ht 71.0 in | Wt 180.0 lb

## 2019-12-05 DIAGNOSIS — N201 Calculus of ureter: Secondary | ICD-10-CM | POA: Diagnosis not present

## 2019-12-05 DIAGNOSIS — N200 Calculus of kidney: Secondary | ICD-10-CM | POA: Diagnosis not present

## 2019-12-05 DIAGNOSIS — R197 Diarrhea, unspecified: Secondary | ICD-10-CM | POA: Diagnosis not present

## 2019-12-05 DIAGNOSIS — Z87442 Personal history of urinary calculi: Secondary | ICD-10-CM | POA: Diagnosis not present

## 2019-12-05 LAB — URINALYSIS, COMPLETE
Bilirubin, UA: NEGATIVE
Glucose, UA: NEGATIVE
Ketones, UA: NEGATIVE
Leukocytes,UA: NEGATIVE
Nitrite, UA: NEGATIVE
Specific Gravity, UA: 1.025 (ref 1.005–1.030)
Urobilinogen, Ur: 0.2 mg/dL (ref 0.2–1.0)
pH, UA: 5.5 (ref 5.0–7.5)

## 2019-12-05 LAB — MICROSCOPIC EXAMINATION
Bacteria, UA: NONE SEEN
RBC, Urine: >30 /hpf — AB (ref 0–2)

## 2019-12-05 NOTE — Progress Notes (Signed)
12/05/2019 4:46 PM   Elijah Peters March 03, 1941 [id number]  CC: Left ureteral stone follow-up  HPI: Elijah Peters is a 79 y.o. male who presents today for follow-up of a 7 mm distal left ureteral stone.  He was seen by Dr. Erlene Quan on 11/14/2019 for the same.  At that time, he elected to proceed with trial of passage on Flomax.  UA notable for microscopic hematuria at that time.  Urine culture ultimately grew low colony count of tetracycline resistant E faecalis.  Given his active stone episode, I prescribed ciprofloxacin 250 mg twice daily x7 days for management.  Today, he reports complete resolution of pain since his last visit.  Upon further reflection, he does report feeling a slight "nudge" in the region of his prostate this morning.  He has been straining his urine 100% of the time since his last visit and does not believe that he has passed any stone yet.  Follow-up KUB today revealed no clear ureteral stone.  In-office UA today positive for 3+ blood and 1+ protein; urine microscopy with >30 RBCs/HPF.  PMH: Past Medical History:  Diagnosis Date  . CAD (coronary artery disease)   . Cancer (HCC)    LYMPHOMA/ TUMOR LEFT SINUS  . Cataract   . GERD (gastroesophageal reflux disease)   . Heart murmur   . History of nuclear stress test 06/2012   normal pattern of perfusion; low risk scan  . Hyperlipidemia   . Hypertension   . LVH (left ventricular hypertrophy)    mild, concentric  . Mild aortic sclerosis   . Myocardial infarction ([place]) 08/1996   non-Q-wave inferolateral   . Non Hodgkin's lymphoma ([place])   . Peyronie's disease   . Prostate cancer [facility])     Surgical History: Past Surgical History:  Procedure Laterality Date  . CARDIAC CATHETERIZATION  1992   PTCA to LAD, in [place]  . CATARACT EXTRACTION  08/06/2010   right eye  . CATARACT EXTRACTION W/PHACO Left 09/23/2016   Procedure: CATARACT EXTRACTION PHACO AND INTRAOCULAR LENS PLACEMENT (IOC);  Surgeon: Estill Cotta, MD;  Location: [facility] ORS;  Service: Ophthalmology;  Laterality: Left;  [place] 1:38.5 AP% 25.3 CDE 48.16 Fluid pack lot # [id number]  . CORONARY ANGIOPLASTY WITH STENT PLACEMENT  1998   L circumflex - 3.0x23.9x9 bare metal stent  . EYE SURGERY  2011   Cataracts removed.  Marland Kitchen HEMORROIDECTOMY    . HERNIA REPAIR  2004  . lymphoma removed    . PROSTATE SURGERY  03/2009   seed implant due to prostate cancer  . TONSILLECTOMY  childhood  . TRANSTHORACIC ECHOCARDIOGRAM  09/01/2011   EF=>55%; mild conc LVH; borderline RV enlargement; LA mildly dilated; mild mitral annular calcif; mild-mod MR; RV systolic pressure elevated; AV mildly sclerotic; mild AV regurg; aortic root sclerosis/calcif    Home Medications:  Allergies as of 12/05/2019      Reactions   Niacin And Related Itching   Niacin Itching      Medication List       Accurate as of December 05, 2019  4:46 PM. If you have any questions, ask your nurse or doctor.        aspirin 81 MG tablet Take 81 mg by mouth daily.   atorvastatin 40 MG tablet Commonly known as: LIPITOR Take 1 tablet (40 mg total) by mouth at bedtime. MUST KEEP APPOINTMENT 01/10/19 WITH DR Claiborne Billings FOR FUTURE REFILLS   B COMPLEX VITAMINS PO Take 1 tablet by mouth daily as  needed (energy).   cyclobenzaprine 10 MG tablet Commonly known as: FLEXERIL Take 1 tablet (10 mg total) by mouth daily as needed for muscle spasms.   ezetimibe 10 MG tablet Commonly known as: Zetia Take 1 tablet (10 mg total) by mouth at bedtime. MUST KEEP APPOINTMENT 01/10/19 WITH DR Claiborne Billings FOR FUTURE REFILLS   HYDROcodone-acetaminophen 5-325 MG tablet Commonly known as: NORCO/VICODIN Take 1 tablet by mouth daily as needed for moderate pain.   icosapent Ethyl 1 g capsule Commonly known as: Vascepa Take 2 capsules (2 g total) by mouth 2 (two) times daily.   metoprolol succinate 50 MG 24 hr tablet Commonly known as: TOPROL-XL Take 1 tablet (50 mg total) by mouth every other day. Alternate  taking 50 mg (1 tablet) daily and 25 mg (1/2 tablet) daily   ondansetron 4 MG disintegrating tablet Commonly known as: Zofran ODT Take 1 tablet (4 mg total) by mouth every 6 (six) hours as needed for nausea or vomiting.   Potassium 99 MG Tabs Take 1 tablet by mouth daily as needed (cramps).   ramipril 10 MG capsule Commonly known as: ALTACE TAKE 1 CAPSULE DAILY   tamsulosin 0.4 MG Caps capsule Commonly known as: FLOMAX Take 1 capsule (0.4 mg total) by mouth daily.   Vitamin D (Cholecalciferol) 10 MCG (400 UNIT) Caps Take 1 tablet by mouth daily as needed.       Allergies:  Allergies  Allergen Reactions  . Niacin And Related Itching  . Niacin Itching    Family History: Family History  Problem Relation Age of Onset  . Emphysema Father   . COPD Father   . Stroke Mother   . Hypertension Brother   . Hyperlipidemia Brother   . Heart disease Brother        CAD    Social History:   reports that he quit smoking about 56 years ago. His smoking use included cigarettes. He quit after 9.00 years of use. He has never used smokeless tobacco. He reports current alcohol use of about 2.0 - 3.0 standard drinks of alcohol per week. He reports that he does not use drugs.  Physical Exam: BP (!) 142/85   Pulse 93   Ht 5\' 11"  (1.803 m)   Wt 180 lb (81.6 kg)   BMI 25.10 kg/m   Constitutional:  Alert and oriented, no acute distress, nontoxic appearing HEENT: [place], AT Cardiovascular: No clubbing, cyanosis, or edema Respiratory: Normal respiratory effort, no increased work of breathing Skin: No rashes, bruises or suspicious lesions Neurologic: Grossly intact, no focal deficits, moving all 4 extremities Psychiatric: Normal mood and affect  Laboratory Data: Results for orders placed or performed in visit on 12/05/19  Microscopic Examination   URINE  Result Value Ref Range   WBC, UA 0-5 0 - 5 /hpf   RBC >30 (A) 0 - 2 /hpf   Epithelial Cells (non renal) 0-10 0 - 10 /hpf   Casts  Present (A) None seen /lpf   Cast Type Hyaline casts N/A   Crystals Present (A) N/A   Crystal Type Calcium Oxalate N/A   Mucus, UA Present (A) Not Estab.   Bacteria, UA None seen None seen/Few  Urinalysis, Complete  Result Value Ref Range   Specific Gravity, UA 1.025 1.005 - 1.030   pH, UA 5.5 5.0 - 7.5   Color, UA Yellow Yellow   Appearance Ur Clear Clear   Leukocytes,UA Negative Negative   Protein,UA 1+ (A) Negative/Trace   Glucose, UA Negative Negative  Ketones, UA Negative Negative   RBC, UA 3+ (A) Negative   Bilirubin, UA Negative Negative   Urobilinogen, Ur 0.2 0.2 - 1.0 mg/dL   Nitrite, UA Negative Negative   Microscopic Examination See below:    Pertinent Imaging: KUB, 12/05/2019: CLINICAL DATA:  Left-sided kidney stone 11/02/2019. Diarrhea recently.  EXAM: ABDOMEN - 1 VIEW  COMPARISON:  11/14/2019 and CT 11/12/2019  FINDINGS: Bowel gas pattern is nonobstructive. No free peritoneal air. No definite calcifications over the kidneys. No abnormal calcifications over the pelvis other than stable phleboliths. Degenerative change of the spine and hips. Radiation seed implants over the prostate.  IMPRESSION: Nonobstructive bowel gas pattern.  No evidence of urinary stones.   Electronically Signed   By: Marin Olp M.D.   On: 12/05/2019 15:36  I personally reviewed the images referenced above and note the absence of clear ureteral calculi.  Assessment & Plan:   1. Left ureteral stone 79 year old male presents for trial of passage follow-up for management of a 7 mm distal left ureteral stone.  Patient reports resolution of flank pain since his last visit, however he has been experiencing some irritative voiding symptoms this morning.  UA notable for microscopic hematuria, otherwise reassuring for infection.  I suspect patient's symptoms and UA are suggestive of stone progression with imminent passage.  We will continue with trial of passage at this time.   Counseled patient to stay well-hydrated, continue straining his urine, and continue Flomax with plans to return to clinic in approximately 3 weeks with KUB prior and repeat UA.  Counseled him to capture the stone if he passes it spontaneously so that we may send it for analysis.  He expressed understanding.  Counseled patient to contact her office immediately or proceed to the emergency room if outside of normal office hours if he develops acute worsening of pain, fever, chills, nausea, or vomiting.  He expressed understanding. - Urinalysis, Complete - DG Abd 1 View; Future   Return in about 3 weeks (around 12/26/2019) for Stone f/u with KUB prior + repeat UA.  Debroah Loop, PA-C  [facility] [address], [place], [place] [address] [phone]

## 2019-12-05 NOTE — Patient Instructions (Signed)
1. Continue daily Flomax and stay well hydrated. 2. If you pass your stone, keep it so we can send it for analysis. 3. I will see you back in 3 weeks with another X-ray prior. If you have not passed the stone by then, we will discuss our next steps. 4. If you develop severe pain, nausea, vomiting, fever, or chills, either call our office immediately or proceed to the Emergency Room.

## 2019-12-22 ENCOUNTER — Ambulatory Visit
Admission: RE | Admit: 2019-12-22 | Discharge: 2019-12-22 | Disposition: A | Discharge status: Home / Self Care | Payer: Medicare Other | Source: Ambulatory Visit | Attending: Physician Assistant | Admitting: Physician Assistant

## 2019-12-22 ENCOUNTER — Ambulatory Visit (INDEPENDENT_AMBULATORY_CARE_PROVIDER_SITE_OTHER): Payer: Medicare Other | Admitting: Physician Assistant

## 2019-12-22 ENCOUNTER — Other Ambulatory Visit: Payer: Self-pay

## 2019-12-22 ENCOUNTER — Encounter: Payer: Self-pay | Admitting: Physician Assistant

## 2019-12-22 VITALS — BP 123/71 | HR 67 | Ht 71.0 in | Wt 180.0 lb

## 2019-12-22 DIAGNOSIS — K5900 Constipation, unspecified: Secondary | ICD-10-CM | POA: Diagnosis not present

## 2019-12-22 DIAGNOSIS — N200 Calculus of kidney: Secondary | ICD-10-CM

## 2019-12-22 DIAGNOSIS — N201 Calculus of ureter: Secondary | ICD-10-CM

## 2019-12-22 NOTE — Progress Notes (Signed)
12/22/2019 10:53 AM   Elijah Peters 12/15/1940 [id number]  CC: Left ureteral stone follow-up  HPI: Elijah Peters is a 79 y.o. male who presents for reevaluation of a trial of passage for management of a 7 mm distal left ureteral stone.  I saw him most recently on 12/05/2019 with reports of resolution of left flank pain, however with new onset of irritative symptoms described as a "twinge at the prostate" as well as microscopic hematuria on UA, otherwise reassuring for infection.  Today, patient reports having passed stone fragments simultaneously 2 weeks ago.  He has had complete resolution of flank pain and irritative lower tract symptoms since.  No acute concerns.  KUB today revealed no discrete ureteral calculi.  UA and microscopy today pan negative.  PMH: Past Medical History:  Diagnosis Date  . CAD (coronary artery disease)   . Cancer (HCC)    LYMPHOMA/ TUMOR LEFT SINUS  . Cataract   . GERD (gastroesophageal reflux disease)   . Heart murmur   . History of nuclear stress test 06/2012   normal pattern of perfusion; low risk scan  . Hyperlipidemia   . Hypertension   . LVH (left ventricular hypertrophy)    mild, concentric  . Mild aortic sclerosis   . Myocardial infarction ([place]) 08/1996   non-Q-wave inferolateral   . Non Hodgkin's lymphoma ([place])   . Peyronie's disease   . Prostate cancer [facility])     Surgical History: Past Surgical History:  Procedure Laterality Date  . CARDIAC CATHETERIZATION  1992   PTCA to LAD, in [place]  . CATARACT EXTRACTION  08/06/2010   right eye  . CATARACT EXTRACTION W/PHACO Left 09/23/2016   Procedure: CATARACT EXTRACTION PHACO AND INTRAOCULAR LENS PLACEMENT (IOC);  Surgeon: Estill Cotta, MD;  Location: [facility] ORS;  Service: Ophthalmology;  Laterality: Left;  [place] 1:38.5 AP% 25.3 CDE 48.16 Fluid pack lot # [id number]  . CORONARY ANGIOPLASTY WITH STENT PLACEMENT  1998   L circumflex - 3.0x23.9x9 bare metal stent  . EYE SURGERY   2011   Cataracts removed.  Marland Kitchen HEMORROIDECTOMY    . HERNIA REPAIR  2004  . lymphoma removed    . PROSTATE SURGERY  03/2009   seed implant due to prostate cancer  . TONSILLECTOMY  childhood  . TRANSTHORACIC ECHOCARDIOGRAM  09/01/2011   EF=>55%; mild conc LVH; borderline RV enlargement; LA mildly dilated; mild mitral annular calcif; mild-mod MR; RV systolic pressure elevated; AV mildly sclerotic; mild AV regurg; aortic root sclerosis/calcif    Home Medications:  Allergies as of 12/22/2019      Reactions   Niacin And Related Itching   Niacin Itching      Medication List       Accurate as of December 22, 2019 10:53 AM. If you have any questions, ask your nurse or doctor.        aspirin 81 MG tablet Take 81 mg by mouth daily.   atorvastatin 40 MG tablet Commonly known as: LIPITOR Take 1 tablet (40 mg total) by mouth at bedtime. MUST KEEP APPOINTMENT 01/10/19 WITH DR Claiborne Billings FOR FUTURE REFILLS   B COMPLEX VITAMINS PO Take 1 tablet by mouth daily as needed (energy).   cyclobenzaprine 10 MG tablet Commonly known as: FLEXERIL Take 1 tablet (10 mg total) by mouth daily as needed for muscle spasms.   ezetimibe 10 MG tablet Commonly known as: Zetia Take 1 tablet (10 mg total) by mouth at bedtime. MUST KEEP APPOINTMENT 01/10/19 WITH DR Claiborne Billings FOR FUTURE  REFILLS   HYDROcodone-acetaminophen 5-325 MG tablet Commonly known as: NORCO/VICODIN Take 1 tablet by mouth daily as needed for moderate pain.   icosapent Ethyl 1 g capsule Commonly known as: Vascepa Take 2 capsules (2 g total) by mouth 2 (two) times daily.   metoprolol succinate 50 MG 24 hr tablet Commonly known as: TOPROL-XL Take 1 tablet (50 mg total) by mouth every other day. Alternate taking 50 mg (1 tablet) daily and 25 mg (1/2 tablet) daily   ondansetron 4 MG disintegrating tablet Commonly known as: Zofran ODT Take 1 tablet (4 mg total) by mouth every 6 (six) hours as needed for nausea or vomiting.   Potassium 99 MG Tabs Take  1 tablet by mouth daily as needed (cramps).   ramipril 10 MG capsule Commonly known as: ALTACE TAKE 1 CAPSULE DAILY   tamsulosin 0.4 MG Caps capsule Commonly known as: FLOMAX Take 1 capsule (0.4 mg total) by mouth daily.   Vitamin D (Cholecalciferol) 10 MCG (400 UNIT) Caps Take 1 tablet by mouth daily as needed.       Allergies:  Allergies  Allergen Reactions  . Niacin And Related Itching  . Niacin Itching    Family History: Family History  Problem Relation Age of Onset  . Emphysema Father   . COPD Father   . Stroke Mother   . Hypertension Brother   . Hyperlipidemia Brother   . Heart disease Brother        CAD    Social History:  reports that he quit smoking about 56 years ago. His smoking use included cigarettes. He quit after 9.00 years of use. He has never used smokeless tobacco. He reports current alcohol use of about 2.0 - 3.0 standard drinks of alcohol per week. He reports that he does not use drugs.  Physical Exam: BP 123/71   Pulse 67   Ht 5\' 11"  (1.803 m)   Wt 180 lb (81.6 kg)   BMI 25.10 kg/m   Constitutional:  Alert and oriented, No acute distress. HEENT: [place] AT, moist mucus membranes.  Trachea midline, no masses. Cardiovascular: No clubbing, cyanosis, or edema. Respiratory: Normal respiratory effort, no increased work of breathing. Skin: No rashes, bruises or suspicious lesions. Neurologic: Grossly intact, no focal deficits, moving all 4 extremities. Psychiatric: Normal mood and affect.  Laboratory Data: Results for orders placed or performed in visit on 12/22/19  Microscopic Examination   URINE  Result Value Ref Range   WBC, UA 0-5 0 - 5 /hpf   RBC 0-2 0 - 2 /hpf   Epithelial Cells (non renal) 0-10 0 - 10 /hpf   Casts Present (A) None seen /lpf   Cast Type Hyaline casts N/A   Bacteria, UA None seen None seen/Few  Urinalysis, Complete  Result Value Ref Range   Specific Gravity, UA 1.025 1.005 - 1.030   pH, UA 6.0 5.0 - 7.5   Color, UA  Yellow Yellow   Appearance Ur Clear Clear   Leukocytes,UA Negative Negative   Protein,UA Negative Negative/Trace   Glucose, UA Negative Negative   Ketones, UA Negative Negative   RBC, UA Negative Negative   Bilirubin, UA Negative Negative   Urobilinogen, Ur 1.0 0.2 - 1.0 mg/dL   Nitrite, UA Negative Negative   Microscopic Examination See below:    Pertinent Imaging: KUB, 12/22/2019: CLINICAL DATA:  Evaluate for LEFT ureteral stone. LEFT-sided pain since March 19th.  EXAM: ABDOMEN - 1 VIEW  COMPARISON:  Plain film of the abdomen dated 12/05/2019.  FINDINGS: No evidence of renal or ureteral stone. Multiple small phleboliths within the lower pelvis.  Bowel gas pattern is nonobstructive. Fairly large amount of stool throughout the nondistended colon. No evidence of abnormal fluid collection. No evidence of free intraperitoneal air. Brachytherapy seeds in the lower pelvis. No acute or suspicious osseous finding. Lung bases appear clear.  IMPRESSION: 1. No evidence of renal or ureteral stone. 2. Nonobstructive bowel gas pattern. 3. Fairly large amount of stool throughout the nondistended colon (constipation?).   Electronically Signed   By: Franki Cabot M.D.   On: 12/22/2019 14:39  I personally reviewed the images referenced above and note clearance of the distal left ureteral stone.  Assessment & Plan:   1. Kidney stones 79 year old male presents today for reevaluation of a trial of passage of a 7 mm distal left ureteral stone.  He has passed several fragments and brings them with him to the office today.  MH resolved.  KUB reassuring for retained stone.  Will send fragments for analysis and contact patient with results.  I counseled the patient on general stone prevention techniques including increasing water intake with a goal of producing 2.5L of urine daily, increased citric acid intake, avoidance of high oxalate-containing foods, and decreasing salt intake.   Written and verbal recommendations provided today.   Patient does not have a history of nephrolithiasis and prefers to follow-up with [place] as needed.  I am in agreement with this plan. - Urinalysis, Complete  Return if symptoms worsen or fail to improve.  Debroah Loop, PA-C  [facility] [address], [place], [place] [address] [phone]

## 2019-12-25 LAB — MICROSCOPIC EXAMINATION: Bacteria, UA: NONE SEEN

## 2019-12-25 LAB — URINALYSIS, COMPLETE
Bilirubin, UA: NEGATIVE
Glucose, UA: NEGATIVE
Ketones, UA: NEGATIVE
Leukocytes,UA: NEGATIVE
Nitrite, UA: NEGATIVE
Protein,UA: NEGATIVE
RBC, UA: NEGATIVE
Specific Gravity, UA: 1.025 (ref 1.005–1.030)
Urobilinogen, Ur: 1 mg/dL (ref 0.2–1.0)
pH, UA: 6 (ref 5.0–7.5)

## 2019-12-29 ENCOUNTER — Other Ambulatory Visit: Payer: Self-pay | Admitting: Physician Assistant

## 2020-01-01 ENCOUNTER — Ambulatory Visit: Payer: Self-pay | Admitting: Physician Assistant

## 2020-01-02 DIAGNOSIS — H35351 Cystoid macular degeneration, right eye: Secondary | ICD-10-CM | POA: Diagnosis not present

## 2020-02-27 ENCOUNTER — Other Ambulatory Visit: Payer: Self-pay | Admitting: Cardiovascular Disease

## 2020-03-07 DIAGNOSIS — C8339 Diffuse large B-cell lymphoma, extranodal and solid organ sites: Secondary | ICD-10-CM | POA: Diagnosis not present

## 2020-04-10 ENCOUNTER — Ambulatory Visit: Payer: Medicare Other | Admitting: Dermatology

## 2020-04-11 ENCOUNTER — Encounter: Payer: Medicare Other | Admitting: Family Medicine

## 2020-04-16 ENCOUNTER — Ambulatory Visit: Payer: Self-pay

## 2020-04-16 ENCOUNTER — Encounter: Payer: Self-pay | Admitting: Family Medicine

## 2020-05-23 ENCOUNTER — Ambulatory Visit (INDEPENDENT_AMBULATORY_CARE_PROVIDER_SITE_OTHER): Payer: Medicare Other

## 2020-05-23 ENCOUNTER — Other Ambulatory Visit: Payer: Self-pay

## 2020-05-23 DIAGNOSIS — Z23 Encounter for immunization: Secondary | ICD-10-CM | POA: Diagnosis not present

## 2020-06-03 DIAGNOSIS — R350 Frequency of micturition: Secondary | ICD-10-CM | POA: Diagnosis not present

## 2020-06-03 DIAGNOSIS — C61 Malignant neoplasm of prostate: Secondary | ICD-10-CM | POA: Diagnosis not present

## 2020-06-05 ENCOUNTER — Ambulatory Visit: Payer: Medicare Other

## 2020-06-10 ENCOUNTER — Ambulatory Visit: Payer: Medicare Other

## 2020-06-20 ENCOUNTER — Other Ambulatory Visit: Payer: Self-pay

## 2020-06-20 ENCOUNTER — Ambulatory Visit (INDEPENDENT_AMBULATORY_CARE_PROVIDER_SITE_OTHER): Payer: Medicare Other

## 2020-06-20 DIAGNOSIS — Z23 Encounter for immunization: Secondary | ICD-10-CM | POA: Diagnosis not present

## 2020-06-27 ENCOUNTER — Other Ambulatory Visit: Payer: Self-pay

## 2020-06-27 ENCOUNTER — Encounter: Payer: Self-pay | Admitting: Cardiovascular Disease

## 2020-06-27 ENCOUNTER — Ambulatory Visit (INDEPENDENT_AMBULATORY_CARE_PROVIDER_SITE_OTHER): Payer: Medicare Other | Admitting: Cardiovascular Disease

## 2020-06-27 VITALS — BP 159/72 | HR 52 | Ht 70.0 in | Wt 180.4 lb

## 2020-06-27 DIAGNOSIS — I2510 Atherosclerotic heart disease of native coronary artery without angina pectoris: Secondary | ICD-10-CM | POA: Diagnosis not present

## 2020-06-27 DIAGNOSIS — I358 Other nonrheumatic aortic valve disorders: Secondary | ICD-10-CM | POA: Diagnosis not present

## 2020-06-27 DIAGNOSIS — E785 Hyperlipidemia, unspecified: Secondary | ICD-10-CM

## 2020-06-27 DIAGNOSIS — I10 Essential (primary) hypertension: Secondary | ICD-10-CM | POA: Diagnosis not present

## 2020-06-27 NOTE — Patient Instructions (Addendum)
Medication Instructions:  No Changes In Medications at this time.  *If you need a refill on your cardiac medications before your next appointment, please call your pharmacy*  Lab Work: TSH, LIPIDS, CMET, CBC- these need to be done after fasting, please return for this.  If you have labs (blood work) drawn today and your tests are completely normal, you will receive your results only by: Marland Kitchen MyChart Message (if you have MyChart) OR . A paper copy in the mail If you have any lab test that is abnormal or we need to change your treatment, we will call you to review the results.  Follow-Up: At [facility], you and your health needs are our priority.  As part of our continuing mission to provide you with exceptional heart care, we have created designated Provider Care Teams.  These Care Teams include your primary Cardiologist (physician) and Advanced Practice Providers (APPs -  Physician Assistants and Nurse Practitioners) who all work together to provide you with the care you need, when you need it.  Your next appointment:   1 year(s)  The format for your next appointment:   In Person  Provider:   Shelva Majestic, MD

## 2020-06-27 NOTE — Progress Notes (Signed)
Patient ID: Elijah Peters, male   DOB: December 21, 1940, 79 y.o.   MRN: [id number]      Primary MD: Dr. Miguel Aschoff  HPI: Elijah Peters is a 79 y.o. male who presents to the office for an 64 month cardiology followup evaluation  Mr. Sees has established CAD and underwent initial PCI to his LAD in 1992 while living in [place]. In January 1998, he underwent insertion of 2 stents in his left circumflex coronary artery by me. He has documented mild aortic valve sclerosis, mild concentric LV hypertrophy and normal systolic and diastolic function. Has a history of hyperlipidemia requiring combination therapy and mild hypertension.   His last nuclear perfusion study was in November 2013 and continued to show normal perfusion. A follow-up echo Doppler study in March 2015  showed an ejection fraction at 55-60% with grade 1 diastolic dysfunction.  His aortic valve is mildly thickened and mildly calcified without restricted mobility.  There was no stenosis.  There was trivial AR.  He did have mild MR.  His left atrium was mildly dilated.  There is trivial TR.  Pulmonary pressures were normal  Do to insurance issues Crestor was switched atorvastatin 40 mg and he has continued to take Zetia 10 mg.  Laboratory i n February 2015 showed a total cholesterol of 104 triglycerides 73 HDL cholesterol 35 LDL cholesterol 54 and VLDL cholesterol 13. He had a normal chemistry profile and glucose was minimally increased at 102. CBC was normal.  When I last saw him he was  walking 1/2 miles per day which was down from previous 2-3 miles per day.  He denied any exertionally precipitated chest tightness.  He does note some fatigability.  He has prostate seeds in place for prostate CA with his PSA being very stable and low.   He was diagnosed with a left sinus mass and has been evaluated by Dr. Sandy Salaam  at [facility].  When I last saw him, he was scheduled to undergo a biopsy of this sinus mass on Thursday, 03/12/2016.  I  recommended that he undergo a nuclear stress test which was done in August 2017.  Prior to any potential major surgery.  Ejection fraction was 63%.  He did not develop ECG changes.  There was normal perfusion.  He had a mild hypertensive blood pressure response to exercise.   He did not require any additional left maxillary surgery since the majority of the tumor was removed at the biopsy.  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  He has undergone chemotherapy with Rituxan and Cytoxan as well as ARA-C.  He also underwent radiation treatment.  He has a Port-A-Cath, which will be removed on March 6.  I saw him in February 2018 at which time he felt well and denied any episodes of chest pain or palpitations or shortness of breath. His last echo Doppler in September 2017 showed normal EF of 60-65% with grade 1 diastolic dysfunction, and mild diffuse aortic sclerosis and mitral annular calcification with trivial AR and mild MR. When he was undergoing chemotherapy, he had stopped some of his cholesterol medication and a subsequent lab study did show an increase in his LDL cholesterol back to 89 from a previous level of 50.   I last saw him in November 2018 and he was back taking atorvastatin 40 mg and Zetia 10 mg. He continues to take Toprol-XL 50 mg daily and ramipril  10 mg for hypertension.  I last evaluated him in a telemedicine visit in May 2020.  Since his prior evaluation he had  continued to be stable from a cardiovascular standpoint.  He specifically denied any episodes of chest pain palpitations shortness of breath, PND orthopnea.  He continues to be followed by Dr. Lawerance Bach regarding his remote prostate cancer which is stable.  His large B-cell lymphoma is also stable.  He had undergone an ophthalmologic procedure last year when he developed blurred vision.  His blood pressure has been running around 130/70.  He denies leg swelling.   He has had difficulty with tendinitis of his shoulder and had recently been started on Naprosyn as needed.  He is walking at least 1-1/2 to 2 miles per day.  Since I last saw him, he was evaluated by Almyra Deforest, PA in December 2020.  At that time he continued to be stable and denied any chest pain or shortness of breath.  Since his last evaluation, he continues to be active and walks at least 6 days/week for a mile and a half with his wife in the morning.  He denies chest pain or shortness of breath.  He denies any dizzy spells.  He had a kidney stone in March 2021 which ultimately cleared.  He completed chemotherapy and radiation for his lymphoma which is stable.  He presents for evaluation.   Past Medical History:  Diagnosis Date  . CAD (coronary artery disease)   . Cancer (HCC)    LYMPHOMA/ TUMOR LEFT SINUS  . Cataract   . GERD (gastroesophageal reflux disease)   . Heart murmur   . History of nuclear stress test 06/2012   normal pattern of perfusion; low risk scan  . Hyperlipidemia   . Hypertension   . LVH (left ventricular hypertrophy)    mild, concentric  . Mild aortic sclerosis   . Myocardial infarction ([place]) 08/1996   non-Q-wave inferolateral   . Non Hodgkin's lymphoma ([place])   . Peyronie's disease   . Prostate cancer [facility])     Past Surgical History:  Procedure Laterality Date  . CARDIAC CATHETERIZATION  1992   PTCA to LAD, in [place]  . CATARACT EXTRACTION  08/06/2010   right eye  . CATARACT EXTRACTION W/PHACO Left 09/23/2016   Procedure: CATARACT EXTRACTION PHACO AND INTRAOCULAR LENS PLACEMENT (IOC);  Surgeon: Estill Cotta, MD;  Location: [facility] ORS;  Service: Ophthalmology;  Laterality: Left;  [place] 1:38.5 AP% 25.3 CDE 48.16 Fluid pack lot # [id number]  . CORONARY ANGIOPLASTY WITH STENT PLACEMENT  1998   L circumflex - 3.0x23.9x9 bare metal stent  . EYE SURGERY  2011   Cataracts removed.  Marland Kitchen HEMORROIDECTOMY    . HERNIA REPAIR  2004  . lymphoma removed    . PROSTATE  SURGERY  03/2009   seed implant due to prostate cancer  . TONSILLECTOMY  childhood  . TRANSTHORACIC ECHOCARDIOGRAM  09/01/2011   EF=>55%; mild conc LVH; borderline RV enlargement; LA mildly dilated; mild mitral annular calcif; mild-mod MR; RV systolic pressure elevated; AV mildly sclerotic; mild AV regurg; aortic root sclerosis/calcif    Allergies  Allergen Reactions  . Niacin And Related Itching  . Niacin Itching    Current Outpatient Medications  Medication Sig Dispense Refill  . Ascorbic Acid (VITAMIN C) 500 MG CAPS Take by mouth.    Marland Kitchen aspirin 81 MG tablet Take 81 mg by mouth daily.     Marland Kitchen atorvastatin (LIPITOR) 40 MG tablet TAKE 1 TABLET AT  BEDTIME (MUST KEEP APPOINTMENT 01/10/19 WITH DR Claiborne Billings FOR FURTHER REFILLS) 90 tablet 3  . B COMPLEX VITAMINS PO Take 1 tablet by mouth daily as needed (energy).     . COD LIVER OIL PO Take by mouth. 2 tablets daily    . Coenzyme Q10 200 MG TABS Take by mouth.    . cyclobenzaprine (FLEXERIL) 10 MG tablet Take 1 tablet (10 mg total) by mouth daily as needed for muscle spasms. 90 tablet 0  . ezetimibe (ZETIA) 10 MG tablet Take 1 tablet (10 mg total) by mouth at bedtime. MUST KEEP APPOINTMENT 01/10/19 WITH DR Claiborne Billings FOR FUTURE REFILLS 90 tablet 0  . HYDROcodone-acetaminophen (NORCO/VICODIN) 5-325 MG tablet Take 1 tablet by mouth daily as needed for moderate pain. 30 tablet 0  . icosapent Ethyl (VASCEPA) 1 g capsule Take 2 capsules (2 g total) by mouth 2 (two) times daily. (Patient taking differently: Take 2 g by mouth daily. ) 120 capsule 6  . metoprolol succinate (TOPROL-XL) 50 MG 24 hr tablet Take 1 tablet (50 mg total) by mouth every other day. Alternate taking 50 mg (1 tablet) daily and 25 mg (1/2 tablet) daily 72 tablet 3  . Omega-3 Fatty Acids (FISH OIL PO) Take by mouth.    . Potassium 99 MG TABS Take 1 tablet by mouth daily as needed (cramps).     . ramipril (ALTACE) 10 MG capsule TAKE 1 CAPSULE DAILY 90 capsule 3  . tamsulosin (FLOMAX) 0.4 MG CAPS  capsule Take 1 capsule (0.4 mg total) by mouth daily. 30 capsule 1   No current facility-administered medications for this visit.    Social History   Socioeconomic History  . Marital status: Married    Spouse name: Not on file  . Number of children: 4  . Years of education: Not on file  . Highest education level: Bachelor's degree (e.g., BA, AB, BS)  Occupational History  . Occupation: retired    Fish farm manager: [employer]  Tobacco Use  . Smoking status: Former Smoker    Years: 9.00    Types: Cigarettes    Quit date: 08/25/1963    Years since quitting: 56.8  . Smokeless tobacco: Never Used  Vaping Use  . Vaping Use: Never used  Substance and Sexual Activity  . Alcohol use: Yes    Alcohol/week: 2.0 - 3.0 standard drinks    Types: 2 - 3 Glasses of wine per week  . Drug use: No  . Sexual activity: Never  Other Topics Concern  . Not on file  Social History Narrative  . Not on file   Social Determinants of Health   Financial Resource Strain:   . Difficulty of Paying Living Expenses: Not on file  Food Insecurity:   . Worried About Charity fundraiser in the Last Year: Not on file  . Ran Out of Food in the Last Year: Not on file  Transportation Needs:   . Lack of Transportation (Medical): Not on file  . Lack of Transportation (Non-Medical): Not on file  Physical Activity:   . Days of Exercise per Week: Not on file  . Minutes of Exercise per Session: Not on file  Stress:   . Feeling of Stress : Not on file  Social Connections:   . Frequency of Communication with Friends and Family: Not on file  . Frequency of Social Gatherings with Friends and Family: Not on file  . Attends Religious Services: Not on file  . Active Member of Clubs or  Organizations: Not on file  . Attends Archivist Meetings: Not on file  . Marital Status: Not on file  Intimate Partner Violence:   . Fear of Current or Ex-Partner: Not on file  . Emotionally Abused: Not on file  . Physically  Abused: Not on file  . Sexually Abused: Not on file    Family History  Problem Relation Age of Onset  . Emphysema Father   . COPD Father   . Stroke Mother   . Hypertension Brother   . Hyperlipidemia Brother   . Heart disease Brother        CAD   Socially he is married has 4 children 5 grandchildren. He is retired. He walks daily. He does take an occasional glass of wine.   ROS General: Negative; No fevers, chills, or night sweats;  HEENT: Positive for recently diagnosed left maxillary sinus mass which is having some impingement inferiorly on his left eye.  No changes in  hearing, difficulty swallowing Pulmonary: Negative; No cough, wheezing, shortness of breath, hemoptysis Cardiovascular: Negative; No chest pain, presyncope, syncope, palpitations GI: Positive for GERD; No nausea, vomiting, diarrhea, or abdominal pain GU: Negative; No dysuria, hematuria, or difficulty voiding Musculoskeletal: Right shin contusion Hematologic/Oncology: Positive for prostate CA treated with seed implantation; no easy bruising, bleeding Endocrine: Negative; no heat/cold intolerance; no diabetes Neuro: Negative; no changes in balance, headaches Skin: Negative; No rashes or skin lesions Psychiatric: Negative; No behavioral problems, depression Sleep: Negative; No snoring, daytime sleepiness, hypersomnolence, bruxism, restless legs, hypnogognic hallucinations, no cataplexy Other comprehensive 14 point system review is negative.   PE BP (!) 159/72   Pulse (!) 52   Ht 5' 10"  (1.778 m)   Wt 180 lb 6.4 oz (81.8 kg)   SpO2 96%   BMI 25.88 kg/m    Repeat blood pressure by me was 136/78 supine and 132/74 standing  Wt Readings from Last 3 Encounters:  06/27/20 180 lb 6.4 oz (81.8 kg)  12/22/19 180 lb (81.6 kg)  12/05/19 180 lb (81.6 kg)   General: Alert, oriented, no distress.  Skin: normal turgor, no rashes, warm and dry HEENT: Normocephalic, atraumatic. Pupils equal round and reactive to light;  sclera anicteric; extraocular muscles intact;  Nose without nasal septal hypertrophy Mouth/Parynx benign; Mallinpatti scale 2 Neck: No JVD, no carotid bruits; normal carotid upstroke Lungs: clear to ausculatation and percussion; no wheezing or rales Chest wall: without tenderness to palpitation Heart: PMI not displaced, RRR, s1 s2 normal, 2/6 systolic murmur, no diastolic murmur, no rubs, gallops, thrills, or heaves Abdomen: soft, nontender; no hepatosplenomehaly, BS+; abdominal aorta nontender and not dilated by palpation. Back: no CVA tenderness Pulses 2+ Musculoskeletal: full range of motion, normal strength, no joint deformities Extremities: no clubbing cyanosis or edema, Homan's sign negative  Neurologic: grossly nonfocal; Cranial nerves grossly wnl Psychologic: Normal mood and affect   ECG (independently read by me): Sinus bradycardia 52 bpm, left axis deviation.  Q wave in lead III and aVF.  Normal intervals.  No ectopy  November 2018 ECG (independently read by me): Sinus bradycardia at 49 bpm.  No ectopy.  Normal intervals.  February 2018 ECG (independently read by me): Normal sinus rhythm at 69 bpm.  Small Q waves in III and F.  No ST segment changes.  Normal intervals.  July 2017 ECG (independently read by me): Normal sinus rhythm at 84 bpm; old inferior Q waves in leads 3 and aVF.  Normal intervals  November 2016 ECG (independently read by  me): Sinus bradycardia 54, old inferior Q waves in 3 and aVF.  Normal intervals.  October 2015 ECG (independently read by me): Sinus bradycardia 55 beats per minute.  Q waves in leads 3 and aVF  March 2015 ECG (independently read by me): Normal sinus rhythm at 61 beats per minute. Normal intervals. Q waves in III and F.  Prior 04/28/13 ECG: Sinus rhythm at 55 beats per minute.  LABS: BMP Latest Ref Rng & Units 11/12/2019 04/10/2019 04/21/2018  Glucose 70 - 99 mg/dL 152(H) 90 97  BUN 8 - 23 mg/dL 17 12 14   Creatinine 0.61 - 1.24 mg/dL 1.17  0.91 0.93  BUN/Creat Ratio 10 - 24 - 13 15  Sodium 135 - 145 mmol/L 138 139 142  Potassium 3.5 - 5.1 mmol/L 3.7 4.7 4.5  Chloride 98 - 111 mmol/L 102 102 105  CO2 22 - 32 mmol/L 24 22 22   Calcium 8.9 - 10.3 mg/dL 9.1 9.1 9.4   Hepatic Function Latest Ref Rng & Units 11/12/2019 04/10/2019 04/21/2018  Total Protein 6.5 - 8.1 g/dL 7.3 6.2 6.3  Albumin 3.5 - 5.0 g/dL 4.1 4.2 4.1  AST 15 - 41 U/L 28 23 20   ALT 0 - 44 U/L 19 14 20   Alk Phosphatase 38 - 126 U/L 83 88 86  Total Bilirubin 0.3 - 1.2 mg/dL 1.9(H) 1.0 0.8  Bilirubin, Direct 0.00 - 0.40 mg/dL - - -   CBC Latest Ref Rng & Units 11/12/2019 04/10/2019 04/21/2018  WBC 4.0 - 10.5 K/uL 8.7 4.8 4.8  Hemoglobin 13.0 - 17.0 g/dL 15.4 14.7 14.6  Hematocrit 39 - 52 % 45.0 42.4 42.3  Platelets 150 - 400 K/uL 156 162 153   Lab Results  Component Value Date   MCV 95.1 11/12/2019   MCV 96 04/10/2019   MCV 96 04/21/2018   Lab Results  Component Value Date   TSH 2.070 04/10/2019  No results found for: HGBA1C   Lipid Panel     Component Value Date/Time   CHOL 119 04/10/2019 1025   TRIG 99 04/10/2019 1025   HDL 32 (L) 04/10/2019 1025   CHOLHDL 3.7 04/10/2019 1025   LDLCALC 67 04/10/2019 1025     ------------------------------------------------------------------- Echo Doppler study 03/09/2016  Study Conclusions  - Left ventricle: Global longitudinal LV strain is normal at -19%  The cavity size was normal. There was moderate focal basal and  mild concentric hypertrophy. Systolic function was normal. The  estimated ejection fraction was in the range of 60% to 65%. Wall  motion was normal; there were no regional wall motion  abnormalities. There was an increased relative contribution of  atrial contraction to ventricular filling. Doppler parameters are  consistent with abnormal left ventricular relaxation (grade 1  diastolic dysfunction). - Aortic valve: Moderately calcified annulus. Trileaflet. Mild  diffuse  calcification, consistent with sclerosis. There was  trivial regurgitation. - Mitral valve: Calcified annulus. There was mild regurgitation. - Pulmonic valve: There was trivial regurgitation.   Nuclear Study Highlights 03/24/2016    The left ventricular ejection fraction is normal (55-65%).  Nuclear stress EF: 63%.  Blood pressure demonstrated a hypertensive response to exercise.  There was no ST segment deviation noted during stress.  No T wave inversion was noted during stress.  This is a low risk study.  Normal perfusion. LVEF 63% with normal wall motion. Good exercise tolerance with hypertensive response to exercise. No chest pain. This is a low risk study.      IMPRESSION:  1.  Coronary artery disease involving native coronary artery of native heart without angina pectoris   2. Essential hypertension   3. Hyperlipidemia with target LDL less than 70   4. Aortic valve sclerosis     ASSESSMENT AND PLAN: Mr. Feagans is an 68  -year-old white male who is 27 years following initial intervention to his LAD in 3 while living in [place] and almost 22 years years following successful stenting with bare-metal stents to his left circumflex coronary artery.  We have been treating him very aggressively with reference to his lipid studies.  Laboratory in March 2014 showed an LDL particle number at  737 ; total cholesterol 75 triglycerides 58 and HDL particle number low at 29.2. He is not having any anginal symptoms. He denies shortness of breath and continues to exercise regularly.  He has remote history of prostate CA.  He underwent successful chemotherapy and radiation therapy for malignant tumor of the maxillary sinus which was diffuse large B-cell lymphoma.  He is now in complete remission.  During the time of chemotherapy, he had not been routinely taking his cholesterol medication.  He subsequently reinstituted treatment with atorvastatin and Zetia.  Presently, his blood  pressure has been fairly stable on a regimen consisting of ramipril 10 mg and metoprolol succinate 50 mg alternating with 25 mg.  Although his blood pressure was elevated when checked by the nurse on repeat by me was improved and in the standing position was 132/74.  His ECG shows sinus bradycardia 52 bpm.  He has been walking at least a mile and a half a day at minimum 6 days/week and some of his bradycardia most likely is contributed by his aerobic conditioning.  He is asymptomatic.  He continues to be on atorvastatin 40 mg, Vascepa 2 capsules twice a day in addition to Zetia 10 mg daily for his hyperlipidemia.  His last laboratory in August 2020 showed an LDL cholesterol at 67, total cholesterol 119 and triglycerides at 99 with low HDL level at 32.  He developed a kidney stone leading to significant discomfort in March and creatinine at that time was 1.17.  I have recommended he undergo follow-up laboratory with a comprehensive metabolic panel, CBC, TSH and lipid studies.  Clinically he is doing well.  He continues to be on aspirin 81 mg.  I will contact him regarding his laboratory and as long as he remains stable I will see him in 1 year for reevaluation or sooner as needed.  Next year we will plan to repeat his echo Doppler study follow-up relation of his aortic valve.   Troy Sine, MD, [facility]  06/27/2020 6:16 PM

## 2020-07-01 ENCOUNTER — Telehealth: Payer: Self-pay

## 2020-07-01 NOTE — Telephone Encounter (Signed)
Copied from [place] [id number]. Topic: General - Other >> Jul 01, 2020  2:54 PM Oneta Rack wrote: Reason for CRM: [facility] placed orders for lab draw, patient would like to come to PCP office please advise

## 2020-07-10 NOTE — Telephone Encounter (Signed)
ok 

## 2020-07-15 NOTE — Telephone Encounter (Signed)
Tried returning call to pt. No answer and no vm.

## 2020-07-23 ENCOUNTER — Encounter: Payer: Medicare Other | Admitting: Family Medicine

## 2020-07-25 ENCOUNTER — Ambulatory Visit: Payer: Medicare Other

## 2020-07-25 NOTE — Telephone Encounter (Signed)
Pt calling in regarding this. Please advise.

## 2020-07-26 DIAGNOSIS — I2510 Atherosclerotic heart disease of native coronary artery without angina pectoris: Secondary | ICD-10-CM | POA: Diagnosis not present

## 2020-07-26 DIAGNOSIS — Z79899 Other long term (current) drug therapy: Secondary | ICD-10-CM | POA: Diagnosis not present

## 2020-07-26 NOTE — Telephone Encounter (Signed)
Patient was advised.  

## 2020-07-27 LAB — COMPREHENSIVE METABOLIC PANEL
ALT: 18 IU/L (ref 0–44)
AST: 23 IU/L (ref 0–40)
Albumin/Globulin Ratio: 1.8 (ref 1.2–2.2)
Albumin: 4.1 g/dL (ref 3.6–4.6)
Alkaline Phosphatase: 103 IU/L (ref 44–121)
BUN/Creatinine Ratio: 19 (ref 10–24)
BUN: 15 mg/dL (ref 8–27)
Bilirubin Total: 0.4 mg/dL (ref 0.0–1.2)
CO2: 21 mmol/L (ref 20–29)
Calcium: 9.3 mg/dL (ref 8.6–10.2)
Chloride: 105 mmol/L (ref 96–106)
Creatinine, Ser: 0.81 mg/dL (ref 0.76–1.27)
GFR calc Af Amer: 96 mL/min/{1.73_m2} (ref >59)
GFR calc non Af Amer: 83 mL/min/{1.73_m2} (ref >59)
Globulin, Total: 2.3 g/dL (ref 1.5–4.5)
Glucose: 100 mg/dL — ABNORMAL HIGH (ref 65–99)
Potassium: 5 mmol/L (ref 3.5–5.2)
Sodium: 140 mmol/L (ref 134–144)
Total Protein: 6.4 g/dL (ref 6.0–8.5)

## 2020-07-27 LAB — LIPID PANEL
Chol/HDL Ratio: 5 ratio (ref 0.0–5.0)
Cholesterol, Total: 154 mg/dL (ref 100–199)
HDL: 31 mg/dL — ABNORMAL LOW (ref >39)
LDL Chol Calc (NIH): 99 mg/dL (ref 0–99)
Triglycerides: 132 mg/dL (ref 0–149)
VLDL Cholesterol Cal: 24 mg/dL (ref 5–40)

## 2020-07-27 LAB — CBC
Hematocrit: 43.2 % (ref 37.5–51.0)
Hemoglobin: 14.6 g/dL (ref 13.0–17.7)
MCH: 33 pg (ref 26.6–33.0)
MCHC: 33.8 g/dL (ref 31.5–35.7)
MCV: 98 fL — ABNORMAL HIGH (ref 79–97)
Platelets: 217 10*3/uL (ref 150–450)
RBC: 4.42 x10E6/uL (ref 4.14–5.80)
RDW: 11.9 % (ref 11.6–15.4)
WBC: 5.7 10*3/uL (ref 3.4–10.8)

## 2020-07-27 LAB — TSH: TSH: 2.11 u[IU]/mL (ref 0.450–4.500)

## 2020-07-29 DIAGNOSIS — H35371 Puckering of macula, right eye: Secondary | ICD-10-CM | POA: Diagnosis not present

## 2020-08-05 ENCOUNTER — Other Ambulatory Visit: Payer: Self-pay | Admitting: Cardiovascular Disease

## 2020-08-05 ENCOUNTER — Other Ambulatory Visit: Payer: Self-pay

## 2020-08-05 MED ORDER — ATORVASTATIN CALCIUM 80 MG PO TABS: 80.0000 mg | ORAL_TABLET | Freq: Every day | ORAL | 3 refills | Status: DC

## 2020-08-06 ENCOUNTER — Telehealth: Payer: Self-pay | Admitting: Cardiovascular Disease

## 2020-08-06 MED ORDER — METOPROLOL SUCCINATE ER 50 MG PO TB24: ORAL_TABLET | ORAL | 3 refills | Status: DC

## 2020-08-06 MED ORDER — ICOSAPENT ETHYL 1 G PO CAPS: 2.0000 g | ORAL_CAPSULE | Freq: Two times a day (BID) | ORAL | 3 refills | Status: AC

## 2020-08-06 MED ORDER — EZETIMIBE 10 MG PO TABS: 10.0000 mg | ORAL_TABLET | Freq: Every day | ORAL | 3 refills | Status: DC

## 2020-08-06 NOTE — Telephone Encounter (Signed)
[facility], other rx sent to pharmacy

## 2020-08-06 NOTE — Telephone Encounter (Signed)
*  STAT* If patient is at the pharmacy, call can be transferred to refill team.   1. Which medications need to be refilled? (please list name of each medication and dose if known)  ramipril (ALTACE) 10 MG capsule; metoprolol succinate (TOPROL-XL) 50 MG 24 hr tablet; icosapent Ethyl (VASCEPA) 1 g capsule; ezetimibe (ZETIA) 10 MG tablet  2. Which pharmacy/location (including street and city if local pharmacy) is medication to be sent to? [facility] [place], [place]  3. Do they need a 30 day or 90 day supply? [place]

## 2020-09-05 DIAGNOSIS — C8339 Diffuse large B-cell lymphoma, extranodal and solid organ sites: Secondary | ICD-10-CM | POA: Diagnosis not present

## 2020-09-19 NOTE — Progress Notes (Signed)
Subjective:   Elijah Peters is a 80 y.o. male who presents for Medicare Annual/Subsequent preventive examination.  Review of Systems    N/A  Cardiac Risk Factors include: advanced age (>103men, >25 women);dyslipidemia;male gender;hypertension     Objective:    Today's Vitals   09/23/20 0901 09/23/20 0921  BP: (!) 172/20 (!) 148/68  Pulse: 68   Temp: 97.8 F (36.6 C)   TempSrc: Oral   SpO2: 98%   Weight: 180 lb 9.6 oz (81.9 kg)   Height: 5\' 10"  (1.778 m)   PainSc: 0-No pain    Body mass index is 25.91 kg/m.  Advanced Directives 09/23/2020 11/12/2019 04/10/2019 04/06/2018 09/23/2016 09/17/2016  Does Patient Have a Medical Advance Directive? Yes Yes Yes Yes Yes Yes  Type of Paramedic of [place];Living will [place];Living will [place];Living will [place];Living will [place];Living will [place];Living will  Does patient want to make changes to medical advance directive? - No - Patient declined - - No - Patient declined -  Copy of [place] in Chart? Yes - validated most recent copy scanned in chart (See row information) No - copy requested Yes - validated most recent copy scanned in chart (See row information) Yes No - copy requested No - copy requested  Would patient like information on creating a medical advance directive? - No - Patient declined - - - -    Current Medications (verified) Outpatient Encounter Medications as of 09/23/2020  Medication Sig  . Ascorbic Acid (VITAMIN C) 500 MG CAPS Take by mouth.  Marland Kitchen aspirin 81 MG tablet Take 81 mg by mouth daily.   Marland Kitchen atorvastatin (LIPITOR) 80 MG tablet Take 1 tablet (80 mg total) by mouth daily.  . B COMPLEX VITAMINS PO Take 1 tablet by mouth daily as needed (energy).   . COD LIVER OIL PO Take by mouth. 2 tablets daily  . Coenzyme Q10 200 MG TABS Take by mouth.  . cyclobenzaprine  (FLEXERIL) 10 MG tablet Take 1 tablet (10 mg total) by mouth daily as needed for muscle spasms.  Marland Kitchen ezetimibe (ZETIA) 10 MG tablet Take 1 tablet (10 mg total) by mouth at bedtime.  Marland Kitchen HYDROcodone-acetaminophen (NORCO/VICODIN) 5-325 MG tablet Take 1 tablet by mouth daily as needed for moderate pain.  Marland Kitchen icosapent Ethyl (VASCEPA) 1 g capsule Take 2 capsules (2 g total) by mouth 2 (two) times daily.  . metoprolol succinate (TOPROL-XL) 50 MG 24 hr tablet Alternate taking 50 mg (1 tablet) daily and 25 mg (1/2 tablet) daily  . Omega-3 Fatty Acids (FISH OIL PO) Take by mouth.  . Potassium 99 MG TABS Take 1 tablet by mouth daily as needed (cramps).   . ramipril (ALTACE) 10 MG capsule TAKE 1 CAPSULE DAILY  . tamsulosin (FLOMAX) 0.4 MG CAPS capsule Take 1 capsule (0.4 mg total) by mouth daily. (Patient not taking: Reported on 09/23/2020)   No facility-administered encounter medications on file as of 09/23/2020.    Allergies (verified) Niacin and related and Niacin   History: Past Medical History:  Diagnosis Date  . Actinic keratosis 01/12/2018   Right forehead. EDC  . CAD (coronary artery disease)   . Cancer (HCC)    LYMPHOMA/ TUMOR LEFT SINUS  . Cataract   . GERD (gastroesophageal reflux disease)   . Heart murmur   . History of nuclear stress test 06/2012   normal pattern of perfusion; low risk  scan  . Hyperlipidemia   . Hypertension   . LVH (left ventricular hypertrophy)    mild, concentric  . Mild aortic sclerosis   . Myocardial infarction ([place]) 08/1996   non-Q-wave inferolateral   . Non Hodgkin's lymphoma ([place])   . Peyronie's disease   . Prostate cancer ([place])   . Squamous cell carcinoma in situ 07/10/2018   L lat base of neck, lat edge    Past Surgical History:  Procedure Laterality Date  . CARDIAC CATHETERIZATION  1992   PTCA to LAD, in [place]  . CATARACT EXTRACTION  08/06/2010   right eye  . CATARACT EXTRACTION W/PHACO Left 09/23/2016   Procedure: CATARACT EXTRACTION PHACO AND  INTRAOCULAR LENS PLACEMENT (IOC);  Surgeon: Estill Cotta, MD;  Location: [facility] ORS;  Service: Ophthalmology;  Laterality: Left;  [place] 1:38.5 AP% 25.3 CDE 48.16 Fluid pack lot # [id number]  . CORONARY ANGIOPLASTY WITH STENT PLACEMENT  1998   L circumflex - 3.0x23.9x9 bare metal stent  . EYE SURGERY  2011   Cataracts removed.  Marland Kitchen HEMORROIDECTOMY    . HERNIA REPAIR  2004  . lymphoma removed    . PROSTATE SURGERY  03/2009   seed implant due to prostate cancer  . TONSILLECTOMY  childhood  . TRANSTHORACIC ECHOCARDIOGRAM  09/01/2011   EF=>55%; mild conc LVH; borderline RV enlargement; LA mildly dilated; mild mitral annular calcif; mild-mod MR; RV systolic pressure elevated; AV mildly sclerotic; mild AV regurg; aortic root sclerosis/calcif   Family History  Problem Relation Age of Onset  . Emphysema Father   . COPD Father   . Stroke Mother   . Hypertension Brother   . Hyperlipidemia Brother   . Heart disease Brother        CAD   Social History   Socioeconomic History  . Marital status: Married    Spouse name: Not on file  . Number of children: 4  . Years of education: Not on file  . Highest education level: Bachelor's degree (e.g., BA, AB, BS)  Occupational History  . Occupation: retired    Fish farm manager: [employer]  Tobacco Use  . Smoking status: Former Smoker    Years: 9.00    Types: Cigarettes    Quit date: 08/25/1963    Years since quitting: 10.1  . Smokeless tobacco: Never Used  Vaping Use  . Vaping Use: Never used  Substance and Sexual Activity  . Alcohol use: Yes    Alcohol/week: 2.0 - 4.0 standard drinks    Types: 2 - 3 Glasses of wine per week    Comment: 1/2 glass 2-3xs a week  . Drug use: No  . Sexual activity: Never  Other Topics Concern  . Not on file  Social History Narrative  . Not on file   Social Determinants of Health   Financial Resource Strain: Low Risk   . Difficulty of Paying Living Expenses: Not hard at all  Food Insecurity: No Food Insecurity   . Worried About Charity fundraiser in the Last Year: Never true  . Ran Out of Food in the Last Year: Never true  Transportation Needs: No Transportation Needs  . Lack of Transportation (Medical): No  . Lack of Transportation (Non-Medical): No  Physical Activity: Inactive  . Days of Exercise per Week: 0 days  . Minutes of Exercise per Session: 0 min  Stress: No Stress Concern Present  . Feeling of Stress : Not at all  Social Connections: Moderately Integrated  . Frequency of Communication with  Friends and Family: More than three times a week  . Frequency of Social Gatherings with Friends and Family: More than three times a week  . Attends Religious Services: More than 4 times per year  . Active Member of Clubs or Organizations: No  . Attends Archivist Meetings: Never  . Marital Status: Married    Tobacco Counseling Counseling given: Not Answered   Clinical Intake:  Pre-visit preparation completed: Yes  Pain : No/denies pain Pain Score: 0-No pain     Nutritional Status: BMI 25 -29 Overweight Nutritional Risks: None Diabetes: No  How often do you need to have someone help you when you read instructions, pamphlets, or other written materials from your doctor or pharmacy?: 3 - Sometimes (Due to double vision.)  Diabetic? No  Interpreter Needed?: No  Information entered by :: [facility], LPN   Activities of Daily Living In your present state of health, do you have any difficulty performing the following activities: 09/23/2020  Hearing? N  Vision? Y  Comment Due to double vision. Sees Dr Sandra Cockayne for eye exams.  Difficulty concentrating or making decisions? N  Walking or climbing stairs? N  Dressing or bathing? N  Doing errands, shopping? N  Preparing Food and eating ? N  Using the Toilet? N  In the past six months, have you accidently leaked urine? N  Do you have problems with loss of bowel control? N  Managing your Medications? N  Managing your  Finances? N  Housekeeping or managing your Housekeeping? N  Some recent data might be hidden    Patient Care Team: Jerrol Banana., MD as PCP - General (Family Medicine) Troy Sine, MD as PCP - Cardiology (Cardiology) Estill Cotta, MD as Consulting Physician (Ophthalmology) Myrlene Broker, MD as Attending Physician (Urology) Corey Harold, MD as Referring Physician (Internal Medicine) Ralene Bathe, MD (Dermatology)  Indicate any recent Medical Services you may have received from other than [facility] providers in the past year (date may be approximate).     Assessment:   This is a routine wellness examination for Troi.  Hearing/Vision screen No exam data present  Dietary issues and exercise activities discussed: Current Exercise Habits: The patient does not participate in regular exercise at present, Exercise limited by: None identified  Goals    . Increase water intake     Recommend to drink at least 6-8 8oz glasses of water per day.      Depression Screen PHQ 2/9 Scores 09/23/2020 04/10/2019 04/06/2018 10/13/2017 09/17/2016 10/09/2015 04/01/2015  PHQ - 2 Score 0 0 0 0 0 0 0    Fall Risk Fall Risk  09/23/2020 07/17/2019 04/10/2019 04/06/2018 10/13/2017  Falls in the past year? 0 0 0 Yes Yes  Comment - Emmi Telephone Survey: data to providers prior to load - - -  Number falls in past yr: 0 - - 1 1  Injury with Fall? 0 - - No Yes  Comment - - - - Hematoma after falling when a chair slipped out from under him.  Follow up - - - Falls prevention discussed Falls prevention discussed    FALL RISK PREVENTION PERTAINING TO THE HOME:  Any stairs in or around the home? Yes  If so, are there any without handrails? No  Home free of loose throw rugs in walkways, pet beds, electrical cords, etc? Yes  Adequate lighting in your home to reduce risk of falls? Yes   ASSISTIVE DEVICES UTILIZED TO PREVENT FALLS:  Life alert? No  Use of a cane, walker or w/c? No  Grab  bars in the bathroom? No  Shower chair or bench in shower? Yes  Elevated toilet seat or a handicapped toilet? Yes   TIMED UP AND GO:  Was the test performed? Yes .  Length of time to ambulate 10 feet: 9 sec.   Gait steady and fast without use of assistive device  Cognitive Function:     6CIT Screen 04/10/2019 09/17/2016  What Year? 0 points 0 points  What month? 0 points 0 points  What time? 0 points 0 points  Count back from 20 0 points 0 points  Months in reverse 0 points 0 points  Repeat phrase 0 points 2 points  Total Score 0 2    Immunizations Immunization History  Administered Date(s) Administered  . Fluad Quad(high Dose 65+) 05/23/2020  . Influenza, High Dose Seasonal PF 04/14/2017, 06/25/2018, 06/16/2019  . Influenza-Unspecified 05/24/2013, 05/30/2015, 08/06/2016  . PFIZER(Purple Top)SARS-COV-2 Vaccination 09/29/2019, 10/24/2019, 06/20/2020  . Pneumococcal Conjugate-13 09/20/2014  . Pneumococcal Polysaccharide-23 07/01/2011  . Tdap 12/27/2014  . Zoster 09/27/2012    TDAP status: Up to date  Flu Vaccine status: Up to date  Pneumococcal vaccine status: Up to date  Covid-19 vaccine status: Completed vaccines  Qualifies for Shingles Vaccine? Yes   Zostavax completed Yes   Shingrix Completed?: No.    Education has been provided regarding the importance of this vaccine. Patient has been advised to call insurance company to determine out of pocket expense if they have not yet received this vaccine. Advised may also receive vaccine at local pharmacy or Health Dept. Verbalized acceptance and understanding.  Screening Tests Health Maintenance  Topic Date Due  . COVID-19 Vaccine (4 - Booster for Pfizer series) 12/19/2020  . TETANUS/TDAP  12/26/2024  . INFLUENZA VACCINE  Completed  . PNA vac Low Risk Adult  Completed    Health Maintenance  There are no preventive care reminders to display for this patient.  Colorectal cancer screening: No longer required.    Lung Cancer Screening: (Low Dose CT Chest recommended if Age 82-80 years, 30 pack-year currently smoking OR have quit w/in 15years.) does not qualify.   Additional Screening:  Vision Screening: Recommended annual ophthalmology exams for early detection of glaucoma and other disorders of the eye. Is the patient up to date with their annual eye exam?  Yes  Who is the provider or what is the name of the office in which the patient attends annual eye exams? Dr Dingeldein @ [place] If pt is not established with a provider, would they like to be referred to a provider to establish care? No .   Dental Screening: Recommended annual dental exams for proper oral hygiene  Community Resource Referral / Chronic Care Management: CRR required this visit?  No   CCM required this visit?  No      Plan:     I have personally reviewed and noted the following in the patient's chart:   . Medical and social history . Use of alcohol, tobacco or illicit drugs  . Current medications and supplements . Functional ability and status . Nutritional status . Physical activity . Advanced directives . List of other physicians . Hospitalizations, surgeries, and ER visits in previous 12 months . Vitals . Screenings to include cognitive, depression, and falls . Referrals and appointments  In addition, I have reviewed and discussed with patient certain preventive protocols, quality metrics, and best practice recommendations. A written personalized  care plan for preventive services as well as general preventive health recommendations were provided to patient.     Ava Tangney [place], [place]   [id number]   Nurse Notes: None.

## 2020-09-23 ENCOUNTER — Ambulatory Visit (INDEPENDENT_AMBULATORY_CARE_PROVIDER_SITE_OTHER): Payer: Medicare Other | Admitting: Family Medicine

## 2020-09-23 ENCOUNTER — Encounter: Payer: Medicare Other | Admitting: Family Medicine

## 2020-09-23 ENCOUNTER — Ambulatory Visit (INDEPENDENT_AMBULATORY_CARE_PROVIDER_SITE_OTHER): Payer: Medicare Other

## 2020-09-23 ENCOUNTER — Other Ambulatory Visit: Payer: Self-pay

## 2020-09-23 ENCOUNTER — Ambulatory Visit: Payer: Medicare Other

## 2020-09-23 VITALS — BP 148/68 | HR 68 | Temp 97.8°F | Ht 70.0 in | Wt 180.6 lb

## 2020-09-23 DIAGNOSIS — I10 Essential (primary) hypertension: Secondary | ICD-10-CM | POA: Diagnosis not present

## 2020-09-23 DIAGNOSIS — Z0000 Encounter for general adult medical examination without abnormal findings: Secondary | ICD-10-CM | POA: Diagnosis not present

## 2020-09-23 DIAGNOSIS — I2510 Atherosclerotic heart disease of native coronary artery without angina pectoris: Secondary | ICD-10-CM | POA: Diagnosis not present

## 2020-09-23 DIAGNOSIS — E7800 Pure hypercholesterolemia, unspecified: Secondary | ICD-10-CM | POA: Diagnosis not present

## 2020-09-23 DIAGNOSIS — Z8546 Personal history of malignant neoplasm of prostate: Secondary | ICD-10-CM

## 2020-09-23 DIAGNOSIS — C8191 Hodgkin lymphoma, unspecified, lymph nodes of head, face, and neck: Secondary | ICD-10-CM

## 2020-09-23 NOTE — Patient Instructions (Signed)
Elijah Peters , Thank you for taking time to come for your Medicare Wellness Visit. I appreciate your ongoing commitment to your health goals. Please review the following plan we discussed and let me know if I can assist you in the future.   Screening recommendations/referrals: Colonoscopy: No longer required.  Recommended yearly ophthalmology/optometry visit for glaucoma screening and checkup Recommended yearly dental visit for hygiene and checkup  Vaccinations: Influenza vaccine: Done 05/23/20 Pneumococcal vaccine: Up to date Tdap vaccine: Up to date, due 12/2024 Shingles vaccine: Shingrix discussed. Please contact your pharmacy for coverage information.     Advanced directives: Currently on file.  Conditions/risks identified: Recommend to drink at least 6-8 8oz glasses of water per day.  Next appointment: 9:40 AM today with Dr Rosanna Randy   Preventive Care 18 Years and Older, Male Preventive care refers to lifestyle choices and visits with your health care provider that can promote health and wellness. What does preventive care include?  A yearly physical exam. This is also called an annual well check.  Dental exams once or twice a year.  Routine eye exams. Ask your health care provider how often you should have your eyes checked.  Personal lifestyle choices, including:  Daily care of your teeth and gums.  Regular physical activity.  Eating a healthy diet.  Avoiding tobacco and drug use.  Limiting alcohol use.  Practicing safe sex.  Taking low doses of aspirin every day.  Taking vitamin and mineral supplements as recommended by your health care provider. What happens during an annual well check? The services and screenings done by your health care provider during your annual well check will depend on your age, overall health, lifestyle risk factors, and family history of disease. Counseling  Your health care provider may ask you questions about your:  Alcohol  use.  Tobacco use.  Drug use.  Emotional well-being.  Home and relationship well-being.  Sexual activity.  Eating habits.  History of falls.  Memory and ability to understand (cognition).  Work and work Statistician. Screening  You may have the following tests or measurements:  Height, weight, and BMI.  Blood pressure.  Lipid and cholesterol levels. These may be checked every 5 years, or more frequently if you are over 82 years old.  Skin check.  Lung cancer screening. You may have this screening every year starting at age 32 if you have a 30-pack-year history of smoking and currently smoke or have quit within the past 15 years.  Fecal occult blood test (FOBT) of the stool. You may have this test every year starting at age 82.  Flexible sigmoidoscopy or colonoscopy. You may have a sigmoidoscopy every 5 years or a colonoscopy every 10 years starting at age 82.  Prostate cancer screening. Recommendations will vary depending on your family history and other risks.  Hepatitis C blood test.  Hepatitis B blood test.  Sexually transmitted disease (STD) testing.  Diabetes screening. This is done by checking your blood sugar (glucose) after you have not eaten for a while (fasting). You may have this done every 1-3 years.  Abdominal aortic aneurysm (AAA) screening. You may need this if you are a current or former smoker.  Osteoporosis. You may be screened starting at age 20 if you are at high risk. Talk with your health care provider about your test results, treatment options, and if necessary, the need for more tests. Vaccines  Your health care provider may recommend certain vaccines, such as:  Influenza vaccine. This is recommended  every year.  Tetanus, diphtheria, and acellular pertussis (Tdap, Td) vaccine. You may need a Td booster every 10 years.  Zoster vaccine. You may need this after age 50.  Pneumococcal 13-valent conjugate (PCV13) vaccine. One dose is  recommended after age 14.  Pneumococcal polysaccharide (PPSV23) vaccine. One dose is recommended after age 61. Talk to your health care provider about which screenings and vaccines you need and how often you need them. This information is not intended to replace advice given to you by your health care provider. Make sure you discuss any questions you have with your health care provider. Document Released: 09/06/2015 Document Revised: 04/29/2016 Document Reviewed: 06/11/2015 Elsevier Interactive Patient Education  2017 [place] Prevention in the Home Falls can cause injuries. They can happen to people of all ages. There are many things you can do to make your home safe and to help prevent falls. What can I do on the outside of my home?  Regularly fix the edges of walkways and driveways and fix any cracks.  Remove anything that might make you trip as you walk through a door, such as a raised step or threshold.  Trim any bushes or trees on the path to your home.  Use bright outdoor lighting.  Clear any walking paths of anything that might make someone trip, such as rocks or tools.  Regularly check to see if handrails are loose or broken. Make sure that both sides of any steps have handrails.  Any raised decks and porches should have guardrails on the edges.  Have any leaves, snow, or ice cleared regularly.  Use sand or salt on walking paths during winter.  Clean up any spills in your garage right away. This includes oil or grease spills. What can I do in the bathroom?  Use night lights.  Install grab bars by the toilet and in the tub and shower. Do not use towel bars as grab bars.  Use non-skid mats or decals in the tub or shower.  If you need to sit down in the shower, use a plastic, non-slip stool.  Keep the floor dry. Clean up any water that spills on the floor as soon as it happens.  Remove soap buildup in the tub or shower regularly.  Attach bath mats  securely with double-sided non-slip rug tape.  Do not have throw rugs and other things on the floor that can make you trip. What can I do in the bedroom?  Use night lights.  Make sure that you have a light by your bed that is easy to reach.  Do not use any sheets or blankets that are too big for your bed. They should not hang down onto the floor.  Have a firm chair that has side arms. You can use this for support while you get dressed.  Do not have throw rugs and other things on the floor that can make you trip. What can I do in the kitchen?  Clean up any spills right away.  Avoid walking on wet floors.  Keep items that you use a lot in easy-to-reach places.  If you need to reach something above you, use a strong step stool that has a grab bar.  Keep electrical cords out of the way.  Do not use floor polish or wax that makes floors slippery. If you must use wax, use non-skid floor wax.  Do not have throw rugs and other things on the floor that can make you trip. What  can I do with my stairs?  Do not leave any items on the stairs.  Make sure that there are handrails on both sides of the stairs and use them. Fix handrails that are broken or loose. Make sure that handrails are as long as the stairways.  Check any carpeting to make sure that it is firmly attached to the stairs. Fix any carpet that is loose or worn.  Avoid having throw rugs at the top or bottom of the stairs. If you do have throw rugs, attach them to the floor with carpet tape.  Make sure that you have a light switch at the top of the stairs and the bottom of the stairs. If you do not have them, ask someone to add them for you. What else can I do to help prevent falls?  Wear shoes that:  Do not have high heels.  Have rubber bottoms.  Are comfortable and fit you well.  Are closed at the toe. Do not wear sandals.  If you use a stepladder:  Make sure that it is fully opened. Do not climb a closed  stepladder.  Make sure that both sides of the stepladder are locked into place.  Ask someone to hold it for you, if possible.  Clearly mark and make sure that you can see:  Any grab bars or handrails.  First and last steps.  Where the edge of each step is.  Use tools that help you move around (mobility aids) if they are needed. These include:  Canes.  Walkers.  Scooters.  Crutches.  Turn on the lights when you go into a dark area. Replace any light bulbs as soon as they burn out.  Set up your furniture so you have a clear path. Avoid moving your furniture around.  If any of your floors are uneven, fix them.  If there are any pets around you, be aware of where they are.  Review your medicines with your doctor. Some medicines can make you feel dizzy. This can increase your chance of falling. Ask your doctor what other things that you can do to help prevent falls. This information is not intended to replace advice given to you by your health care provider. Make sure you discuss any questions you have with your health care provider. Document Released: 06/06/2009 Document Revised: 01/16/2016 Document Reviewed: 09/14/2014 Elsevier Interactive Patient Education  2017 [employer].

## 2020-09-23 NOTE — Progress Notes (Signed)
I,[name],acting as a scribe for [name], MD.,have documented all relevant documentation on the behalf of [name], MD,as directed by  [name], MD while in the presence of [name], MD.   Established patient visit   Patient: Elijah Peters   DOB: [birth year]   80 y.o. Male  MRN: [id number] Visit Date: 09/23/2020  Today's healthcare provider: [name], MD   No chief complaint on file.  Subjective    HPI  Patient had AWV with [facility] today at 9:00 am. He is feeling well and has no significant complaints.  He has seen oncology in the past month and they gave him a good report. He saw Dr. Claiborne Billings from cardiology late last year and his atorvastatin was doubled and he is tolerating this and his last LDL cholesterol was 54. He is having some occasional cramps in his legs and feet but these are minimal.  He has mild cramping in his left lateral neck recently which is relieved when he rubs it.  Goes away easily.  It is not exertional.  No other symptoms. Hypertension, follow-up  BP Readings from Last 3 Encounters:  06/27/20 (!) 159/72  12/22/19 123/71  12/05/19 (!) 142/85   Wt Readings from Last 3 Encounters:  06/27/20 180 lb 6.4 oz (81.8 kg)  12/22/19 180 lb (81.6 kg)  12/05/19 180 lb (81.6 kg)     He was last seen for hypertension 04/10/2019.  BP at that visit was 128/62. Management since that visit includes; Controlled with Altace and Toprol. He reports excellent compliance with treatment. He is not having side effects.  He is not exercising.  Due to ice and cold weather. He is adherent to low salt diet.   Outside blood pressures are good.Marland Kitchen  He does not smoke.  Use of agents associated with hypertension: none.   --------------------------------------------------------------------  Lipid/Cholesterol, follow-up  Last Lipid Panel: Lab Results  Component Value Date   CHOL 154 07/26/2020   LDLCALC 99 07/26/2020   HDL  31 (L) 07/26/2020   TRIG 132 07/26/2020    He was last seen for this 2 months ago.  Management since that visit includes; On Lipitor and Zetia.  He reports excellent compliance with treatment. He is not having side effects.  He is following a Low fat diet. Current exercise: walking  Last metabolic panel Lab Results  Component Value Date   GLUCOSE 100 (H) 07/26/2020   NA 140 07/26/2020   K 5.0 07/26/2020   BUN 15 07/26/2020   CREATININE 0.81 07/26/2020   GFRNONAA 83 07/26/2020   GFRAA 96 07/26/2020   CALCIUM 9.3 07/26/2020   AST 23 07/26/2020   ALT 18 07/26/2020   The ASCVD Risk score Mikey Bussing DC Jr., et al., 2013) failed to calculate for the following reasons:   The 2013 ASCVD risk score is only valid for ages 73 to 34   The patient has a prior MI or stroke diagnosis  -------------------------------------------------------------------- Vitals:  BP 148/68Important (BP Location: Right Arm)  Pulse 68  Temp 97.8 F (36.6 C) (Oral)  Ht 5\' 10"  (1.778 m)  Wt 180 lb 9.6 oz (81.9 kg)  SpO2 98%  BMI 25.91 kg/m  BSA 2.01 m  Pain [place] 0-No pain           Medications: Outpatient Medications Prior to Visit  Medication Sig  . Ascorbic Acid (VITAMIN C) 500 MG CAPS Take by mouth.  Marland Kitchen aspirin 81 MG tablet Take  81 mg by mouth daily.   Marland Kitchen atorvastatin (LIPITOR) 80 MG tablet Take 1 tablet (80 mg total) by mouth daily.  . B COMPLEX VITAMINS PO Take 1 tablet by mouth daily as needed (energy).   . COD LIVER OIL PO Take by mouth. 2 tablets daily  . Coenzyme Q10 200 MG TABS Take by mouth.  . cyclobenzaprine (FLEXERIL) 10 MG tablet Take 1 tablet (10 mg total) by mouth daily as needed for muscle spasms.  Marland Kitchen ezetimibe (ZETIA) 10 MG tablet Take 1 tablet (10 mg total) by mouth at bedtime.  Marland Kitchen HYDROcodone-acetaminophen (NORCO/VICODIN) 5-325 MG tablet Take 1 tablet by mouth daily as needed for moderate pain.  Marland Kitchen icosapent Ethyl (VASCEPA) 1 g capsule Take 2 capsules (2 g total) by mouth 2 (two)  times daily.  . metoprolol succinate (TOPROL-XL) 50 MG 24 hr tablet Alternate taking 50 mg (1 tablet) daily and 25 mg (1/2 tablet) daily  . Omega-3 Fatty Acids (FISH OIL PO) Take by mouth.  . Potassium 99 MG TABS Take 1 tablet by mouth daily as needed (cramps).   . ramipril (ALTACE) 10 MG capsule TAKE 1 CAPSULE DAILY  . tamsulosin (FLOMAX) 0.4 MG CAPS capsule Take 1 capsule (0.4 mg total) by mouth daily.   No facility-administered medications prior to visit.    Review of Systems  All other systems reviewed and are negative.   Last lipids Lab Results  Component Value Date   CHOL 154 07/26/2020   HDL 31 (L) 07/26/2020   LDLCALC 99 07/26/2020   TRIG 132 07/26/2020   CHOLHDL 5.0 07/26/2020       Objective    There were no vitals taken for this visit. BP Readings from Last 3 Encounters:  09/23/20 (!) 148/68  06/27/20 (!) 159/72  12/22/19 123/71   Wt Readings from Last 3 Encounters:  09/23/20 180 lb 9.6 oz (81.9 kg)  06/27/20 180 lb 6.4 oz (81.8 kg)  12/22/19 180 lb (81.6 kg)       Physical Exam Vitals reviewed.  Constitutional:      Appearance: He is well-developed.  HENT:     Head: Normocephalic and atraumatic.  Eyes:     Conjunctiva/sclera: Conjunctivae normal.     Pupils: Pupils are equal, round, and reactive to light.  Neck:     Thyroid: No thyromegaly.  Cardiovascular:     Rate and Rhythm: Normal rate and regular rhythm.     Heart sounds: Normal heart sounds.  Pulmonary:     Effort: Pulmonary effort is normal.     Breath sounds: Normal breath sounds.  Abdominal:     Palpations: Abdomen is soft.  Musculoskeletal:        General: Normal range of motion.     Cervical back: Normal range of motion and neck supple.  Lymphadenopathy:     Cervical: No cervical adenopathy.  Skin:    General: Skin is warm and dry.  Neurological:     General: No focal deficit present.     Mental Status: He is alert and oriented to person, place, and time. Mental status is at  baseline.     Deep Tendon Reflexes: Reflexes are normal and symmetric.  Psychiatric:        Mood and Affect: Mood normal.        Behavior: Behavior normal.        Thought Content: Thought content normal.        Judgment: Judgment normal.       No results  found for any visits on 09/23/20.  Assessment & Plan     Problem List Items Addressed This Visit      Cardiovascular and Mediastinum   CAD (coronary artery disease) - Primary   Essential (primary) hypertension     Other   Hypercholesterolemia without hypertriglyceridemia    Well-controlled with LDL of 54 on Lipitor 80       Other Visit Diagnoses    History of prostate cancer       Hodgkin lymphoma of lymph nodes of neck, unspecified Hodgkin lymphoma type ([place])           No follow-ups on file.      I, [name], MD, have reviewed all documentation for this visit. The documentation on 09/23/20 for the exam, diagnosis, procedures, and orders are all accurate and complete.    Javae Braaten Cranford Mon, MD  [facility] [phone] (phone) [phone] (fax)  [place]

## 2020-09-23 NOTE — Assessment & Plan Note (Signed)
Well-controlled with LDL of 54 on Lipitor 80

## 2020-10-17 ENCOUNTER — Ambulatory Visit (INDEPENDENT_AMBULATORY_CARE_PROVIDER_SITE_OTHER): Payer: Medicare Other | Admitting: Dermatology

## 2020-10-17 ENCOUNTER — Other Ambulatory Visit: Payer: Self-pay

## 2020-10-17 DIAGNOSIS — L821 Other seborrheic keratosis: Secondary | ICD-10-CM

## 2020-10-17 DIAGNOSIS — L814 Other melanin hyperpigmentation: Secondary | ICD-10-CM | POA: Diagnosis not present

## 2020-10-17 DIAGNOSIS — D1800 Hemangioma unspecified site: Secondary | ICD-10-CM | POA: Diagnosis not present

## 2020-10-17 DIAGNOSIS — L570 Actinic keratosis: Secondary | ICD-10-CM | POA: Diagnosis not present

## 2020-10-17 DIAGNOSIS — D229 Melanocytic nevi, unspecified: Secondary | ICD-10-CM

## 2020-10-17 DIAGNOSIS — Z1283 Encounter for screening for malignant neoplasm of skin: Secondary | ICD-10-CM

## 2020-10-17 DIAGNOSIS — I2510 Atherosclerotic heart disease of native coronary artery without angina pectoris: Secondary | ICD-10-CM

## 2020-10-17 DIAGNOSIS — L578 Other skin changes due to chronic exposure to nonionizing radiation: Secondary | ICD-10-CM | POA: Diagnosis not present

## 2020-10-17 DIAGNOSIS — I872 Venous insufficiency (chronic) (peripheral): Secondary | ICD-10-CM

## 2020-10-17 NOTE — Patient Instructions (Signed)

## 2020-10-17 NOTE — Progress Notes (Signed)
   Follow-Up Visit   Subjective  Elijah Peters is a 80 y.o. male who presents for the following: Annual Exam (History of AKs - TBSE today) and Other (Spots of earlobes x few months). The patient presents for Total-Body Skin Exam (TBSE) for skin cancer screening and mole check.  The following portions of the chart were reviewed this encounter and updated as appropriate:   Tobacco  Allergies  Meds  Problems  Med Hx  Surg Hx  Fam Hx     Review of Systems:  No other skin or systemic complaints except as noted in HPI or Assessment and Plan.  Objective  Well appearing patient in no apparent distress; mood and affect are within normal limits.  A full examination was performed including scalp, head, eyes, ears, nose, lips, neck, chest, axillae, abdomen, back, buttocks, bilateral upper extremities, bilateral lower extremities, hands, feet, fingers, toes, fingernails, and toenails. All findings within normal limits unless otherwise noted below.  Objective  Face, ears (10): Erythematous thin papules/macules with gritty scale.    Assessment & Plan    Stasis changes of bilateral lower legs  Lentigines - Scattered tan macules - Due to sun exposure - Benign-appering, observe - Recommend daily broad spectrum sunscreen SPF 30+ to sun-exposed areas, reapply every 2 hours as needed. - Call for any changes  Seborrheic Keratoses - Stuck-on, waxy, tan-brown papules and plaques  - Discussed benign etiology and prognosis. - Observe - Call for any changes  Melanocytic Nevi - Tan-brown and/or pink-flesh-colored symmetric macules and papules - Benign appearing on exam today - Observation - Call clinic for new or changing moles - Recommend daily use of broad spectrum spf 30+ sunscreen to sun-exposed areas.   Hemangiomas - Red papules - Discussed benign nature - Observe - Call for any changes  Actinic Damage - Chronic, secondary to cumulative UV/sun exposure - diffuse scaly  erythematous macules with underlying dyspigmentation - Recommend daily broad spectrum sunscreen SPF 30+ to sun-exposed areas, reapply every 2 hours as needed.  - Call for new or changing lesions.  Skin cancer screening performed today.  AK (actinic keratosis) (10) Face, ears  Destruction of lesion - Face, ears Complexity: simple   Destruction method: cryotherapy   Informed consent: discussed and consent obtained   Timeout:  patient name, date of birth, surgical site, and procedure verified Lesion destroyed using liquid nitrogen: Yes   Region frozen until ice ball extended beyond lesion: Yes   Outcome: patient tolerated procedure well with no complications   Post-procedure details: wound care instructions given    Skin cancer screening  Return in about 1 year (around 10/17/2021) for TBSE.  Documentation: I have reviewed the above documentation for accuracy and completeness, and I agree with the above.  Sarina Ser, MD

## 2020-10-19 ENCOUNTER — Encounter: Payer: Self-pay | Admitting: Dermatology

## 2021-01-27 DIAGNOSIS — H35351 Cystoid macular degeneration, right eye: Secondary | ICD-10-CM | POA: Diagnosis not present

## 2021-03-19 NOTE — Progress Notes (Signed)
Established patient visit   Patient: Elijah Peters   DOB: February 24, 1941   80 y.o. Male  MRN: [id number] Visit Date: 03/20/2021  Today's healthcare provider: [name], MD   Chief Complaint  Patient presents with   Hypertension   Muscle Pain   Subjective    HPI  Patient comes in today with recent low back strain and shoulder stiffness.  Low back pain has been fairly severe and is kept him from sleeping. His chronic medical problems are doing well and he disease Dr. Claiborne Billings from cardiology Dr. Sandra Cockayne from ophthalmology and dr Nehemiah Massed.from dermatology. Hypertension, follow-up  BP Readings from Last 3 Encounters:  03/20/21 (!) 150/85  09/23/20 (!) 148/68  06/27/20 (!) 159/72   Wt Readings from Last 3 Encounters:  03/20/21 181 lb (82.1 kg)  09/23/20 180 lb 9.6 oz (81.9 kg)  06/27/20 180 lb 6.4 oz (81.8 kg)     He was last seen for hypertension 6 months ago.  BP at that visit was 148/68. Management since that visit includes; on metoprolol and ramipril. He reports good compliance with treatment. He is not having side effects.  He is not exercising. He is adherent to low salt diet.   Outside blood pressures are checked occasionally.  He does not smoke.  Use of agents associated with hypertension: none.       Medications: Outpatient Medications Prior to Visit  Medication Sig   Ascorbic Acid (VITAMIN C) 500 MG CAPS Take by mouth.   aspirin 81 MG tablet Take 81 mg by mouth daily.    atorvastatin (LIPITOR) 80 MG tablet Take 1 tablet (80 mg total) by mouth daily.   B COMPLEX VITAMINS PO Take 1 tablet by mouth daily as needed (energy).    COD LIVER OIL PO Take by mouth. 2 tablets daily   Coenzyme Q10 200 MG TABS Take by mouth.   cyclobenzaprine (FLEXERIL) 10 MG tablet Take 1 tablet (10 mg total) by mouth daily as needed for muscle spasms.   ezetimibe (ZETIA) 10 MG tablet Take 1 tablet (10 mg total) by mouth at bedtime.   HYDROcodone-acetaminophen  (NORCO/VICODIN) 5-325 MG tablet Take 1 tablet by mouth daily as needed for moderate pain.   icosapent Ethyl (VASCEPA) 1 g capsule Take 2 capsules (2 g total) by mouth 2 (two) times daily.   metoprolol succinate (TOPROL-XL) 50 MG 24 hr tablet Alternate taking 50 mg (1 tablet) daily and 25 mg (1/2 tablet) daily   Omega-3 Fatty Acids (FISH OIL PO) Take by mouth.   Potassium 99 MG TABS Take 1 tablet by mouth daily as needed (cramps).    ramipril (ALTACE) 10 MG capsule TAKE 1 CAPSULE DAILY   tamsulosin (FLOMAX) 0.4 MG CAPS capsule Take 1 capsule (0.4 mg total) by mouth daily. (Patient not taking: No sig reported)   No facility-administered medications prior to visit.    Review of Systems  Constitutional:  Negative for appetite change, chills and fever.  Respiratory:  Negative for chest tightness, shortness of breath and wheezing.   Cardiovascular:  Negative for chest pain and palpitations.  Gastrointestinal:  Negative for abdominal pain, nausea and vomiting.       Objective    BP (!) 150/85   Pulse 65   Temp 98.5 F (36.9 C)   Resp 16   Wt 181 lb (82.1 kg)   BMI 25.97 kg/m  BP Readings from Last 3 Encounters:  03/20/21 (!) 150/85  09/23/20 (!) 148/68  06/27/20 Marland Kitchen)  159/72   Wt Readings from Last 3 Encounters:  03/20/21 181 lb (82.1 kg)  09/23/20 180 lb 9.6 oz (81.9 kg)  06/27/20 180 lb 6.4 oz (81.8 kg)       Physical Exam Vitals reviewed.  Constitutional:      Appearance: He is well-developed.  HENT:     Head: Normocephalic and atraumatic.  Eyes:     Conjunctiva/sclera: Conjunctivae normal.     Pupils: Pupils are equal, round, and reactive to light.  Neck:     Thyroid: No thyromegaly.  Cardiovascular:     Rate and Rhythm: Normal rate and regular rhythm.     Heart sounds: Normal heart sounds.  Pulmonary:     Effort: Pulmonary effort is normal.     Breath sounds: Normal breath sounds.  Abdominal:     Palpations: Abdomen is soft.  Musculoskeletal:        General: No  tenderness or deformity. Normal range of motion.     Cervical back: Normal range of motion and neck supple.  Lymphadenopathy:     Cervical: No cervical adenopathy.  Skin:    General: Skin is warm and dry.  Neurological:     General: No focal deficit present.     Mental Status: He is alert and oriented to person, place, and time. Mental status is at baseline.     Deep Tendon Reflexes: Reflexes are normal and symmetric.  Psychiatric:        Mood and Affect: Mood normal.        Behavior: Behavior normal.        Thought Content: Thought content normal.        Judgment: Judgment normal.      No results found for any visits on 03/20/21.  Assessment & Plan     1. Back spasm Try Flexeril and Norco for as needed pain. - cyclobenzaprine (FLEXERIL) 10 MG tablet; Take 1 tablet (10 mg total) by mouth daily as needed for muscle spasms.  Dispense: 90 tablet; Refill: 0  2. Essential (primary) hypertension Fair control.  I think is up a little bit because of his back pain today.  He is on ramipril and metoprolol  3. Stiffness of shoulder joint, unspecified laterality Check sed rate to rule out polymyalgia rheumatica - Sedimentation rate  4. Coronary artery disease involving native coronary artery of native heart without angina pectoris All risk factors treated patient on atorvastatin  5. Hodgkin lymphoma of lymph nodes of neck, unspecified Hodgkin lymphoma type ([place]) Followed by oncology  6. Diffuse large B-cell lymphoma, unspecified body region [facility])    No follow-ups on file.      I, [name], MD, have reviewed all documentation for this visit. The documentation on 04/05/21 for the exam, diagnosis, procedures, and orders are all accurate and complete.    Merna Baldi Cranford Mon, MD  [facility] [phone] (phone) [phone] (fax)  [place]

## 2021-03-20 ENCOUNTER — Encounter: Payer: Self-pay | Admitting: Family Medicine

## 2021-03-20 ENCOUNTER — Other Ambulatory Visit: Payer: Self-pay

## 2021-03-20 ENCOUNTER — Ambulatory Visit (INDEPENDENT_AMBULATORY_CARE_PROVIDER_SITE_OTHER): Payer: Medicare Other | Admitting: Family Medicine

## 2021-03-20 VITALS — BP 150/85 | HR 65 | Temp 98.5°F | Resp 16 | Ht 70.0 in | Wt 181.0 lb

## 2021-03-20 DIAGNOSIS — I2510 Atherosclerotic heart disease of native coronary artery without angina pectoris: Secondary | ICD-10-CM

## 2021-03-20 DIAGNOSIS — C8330 Diffuse large B-cell lymphoma, unspecified site: Secondary | ICD-10-CM | POA: Diagnosis not present

## 2021-03-20 DIAGNOSIS — C8191 Hodgkin lymphoma, unspecified, lymph nodes of head, face, and neck: Secondary | ICD-10-CM

## 2021-03-20 DIAGNOSIS — M25619 Stiffness of unspecified shoulder, not elsewhere classified: Secondary | ICD-10-CM

## 2021-03-20 DIAGNOSIS — I10 Essential (primary) hypertension: Secondary | ICD-10-CM

## 2021-03-20 DIAGNOSIS — M62830 Muscle spasm of back: Secondary | ICD-10-CM

## 2021-03-20 MED ORDER — HYDROCODONE-ACETAMINOPHEN 5-325 MG PO TABS: 1.0000 | ORAL_TABLET | Freq: Four times a day (QID) | ORAL | 0 refills | Status: DC | PRN

## 2021-03-20 MED ORDER — CYCLOBENZAPRINE HCL 10 MG PO TABS: 10.0000 mg | ORAL_TABLET | Freq: Every day | ORAL | 0 refills | Status: DC | PRN

## 2021-03-27 DIAGNOSIS — C8339 Diffuse large B-cell lymphoma, extranodal and solid organ sites: Secondary | ICD-10-CM | POA: Diagnosis not present

## 2021-04-01 DIAGNOSIS — M25619 Stiffness of unspecified shoulder, not elsewhere classified: Secondary | ICD-10-CM | POA: Diagnosis not present

## 2021-04-02 LAB — SEDIMENTATION RATE: Sed Rate: 2 mm/hr (ref 0–30)

## 2021-04-17 ENCOUNTER — Telehealth: Payer: Self-pay

## 2021-04-17 NOTE — Telephone Encounter (Signed)
Copied from [place] [id number]. Topic: General - Other >> Apr 17, 2021 10:47 AM Tessa Lerner A wrote: Reason for CRM: The patient would like to be contacted regarding lab results from 04/01/21  Please contact further when possible

## 2021-04-17 NOTE — Telephone Encounter (Signed)
Left message to call back. Unable to leave VM due to mailbox not being set up. Will try again later. CRM created.

## 2021-06-12 DIAGNOSIS — C61 Malignant neoplasm of prostate: Secondary | ICD-10-CM | POA: Diagnosis not present

## 2021-06-25 ENCOUNTER — Ambulatory Visit (INDEPENDENT_AMBULATORY_CARE_PROVIDER_SITE_OTHER): Payer: Medicare Other | Admitting: Dermatology

## 2021-06-25 ENCOUNTER — Other Ambulatory Visit: Payer: Self-pay

## 2021-06-25 DIAGNOSIS — I2510 Atherosclerotic heart disease of native coronary artery without angina pectoris: Secondary | ICD-10-CM | POA: Diagnosis not present

## 2021-06-25 DIAGNOSIS — L820 Inflamed seborrheic keratosis: Secondary | ICD-10-CM | POA: Diagnosis not present

## 2021-06-25 DIAGNOSIS — L578 Other skin changes due to chronic exposure to nonionizing radiation: Secondary | ICD-10-CM | POA: Diagnosis not present

## 2021-06-25 DIAGNOSIS — L570 Actinic keratosis: Secondary | ICD-10-CM

## 2021-06-25 NOTE — Progress Notes (Signed)
   Follow-Up Visit   Subjective  Elijah Peters is a 80 y.o. male who presents for the following: Irregular skin lesion (On the R and L jaw that patient would like checked today.).  The following portions of the chart were reviewed this encounter and updated as appropriate:   Tobacco  Allergies  Meds  Problems  Med Hx  Surg Hx  Fam Hx     Review of Systems:  No other skin or systemic complaints except as noted in HPI or Assessment and Plan.  Objective  Well appearing patient in no apparent distress; mood and affect are within normal limits.  A focused examination was performed including the face. Relevant physical exam findings are noted in the Assessment and Plan.  R neck x 2, L preauricular x 1, L neck x 3 (6) Erythematous keratotic or waxy stuck-on papule or plaque.   R preauricular x 1, R forehead x 1, L face x 4 (6) Erythematous thin papules/macules with gritty scale.    Assessment & Plan  Inflamed seborrheic keratosis R neck x 2, L preauricular x 1, L neck x 3  Destruction of lesion - R neck x 2, L preauricular x 1, L neck x 3 Complexity: simple   Destruction method: cryotherapy   Informed consent: discussed and consent obtained   Timeout:  patient name, date of birth, surgical site, and procedure verified Lesion destroyed using liquid nitrogen: Yes   Region frozen until ice ball extended beyond lesion: Yes   Outcome: patient tolerated procedure well with no complications   Post-procedure details: wound care instructions given    AK (actinic keratosis) (6) R preauricular x 1, R forehead x 1, L face x 4  Destruction of lesion - R preauricular x 1, R forehead x 1, L face x 4 Complexity: simple   Destruction method: cryotherapy   Informed consent: discussed and consent obtained   Timeout:  patient name, date of birth, surgical site, and procedure verified Lesion destroyed using liquid nitrogen: Yes   Region frozen until ice ball extended beyond lesion: Yes    Outcome: patient tolerated procedure well with no complications   Post-procedure details: wound care instructions given    Actinic Damage - chronic, secondary to cumulative UV radiation exposure/sun exposure over time - diffuse scaly erythematous macules with underlying dyspigmentation - Recommend daily broad spectrum sunscreen SPF 30+ to sun-exposed areas, reapply every 2 hours as needed.  - Recommend staying in the shade or wearing long sleeves, sun glasses (UVA+UVB protection) and wide brim hats (4-inch brim around the entire circumference of the hat). - Call for new or changing lesions.  Return for appointment as scheduled.  Luther Redo, CMA, am acting as scribe for Sarina Ser, MD . Documentation: I have reviewed the above documentation for accuracy and completeness, and I agree with the above.  Sarina Ser, MD

## 2021-06-25 NOTE — Patient Instructions (Signed)

## 2021-06-26 ENCOUNTER — Encounter: Payer: Self-pay | Admitting: Dermatology

## 2021-07-22 DIAGNOSIS — Z23 Encounter for immunization: Secondary | ICD-10-CM | POA: Diagnosis not present

## 2021-07-24 ENCOUNTER — Ambulatory Visit: Payer: Self-pay | Admitting: Family Medicine

## 2021-08-05 ENCOUNTER — Ambulatory Visit: Payer: PRIVATE HEALTH INSURANCE | Admitting: Dermatology

## 2021-09-29 ENCOUNTER — Other Ambulatory Visit: Payer: Self-pay

## 2021-09-29 ENCOUNTER — Ambulatory Visit (INDEPENDENT_AMBULATORY_CARE_PROVIDER_SITE_OTHER): Payer: Medicare Other

## 2021-09-29 ENCOUNTER — Ambulatory Visit (INDEPENDENT_AMBULATORY_CARE_PROVIDER_SITE_OTHER): Payer: Medicare Other | Admitting: Family Medicine

## 2021-09-29 ENCOUNTER — Encounter: Payer: Self-pay | Admitting: Family Medicine

## 2021-09-29 VITALS — BP 150/78 | HR 64 | Temp 98.6°F | Ht 70.5 in | Wt 183.3 lb

## 2021-09-29 DIAGNOSIS — M62830 Muscle spasm of back: Secondary | ICD-10-CM | POA: Diagnosis not present

## 2021-09-29 DIAGNOSIS — Z0000 Encounter for general adult medical examination without abnormal findings: Secondary | ICD-10-CM | POA: Diagnosis not present

## 2021-09-29 DIAGNOSIS — E7800 Pure hypercholesterolemia, unspecified: Secondary | ICD-10-CM

## 2021-09-29 DIAGNOSIS — I10 Essential (primary) hypertension: Secondary | ICD-10-CM | POA: Diagnosis not present

## 2021-09-29 DIAGNOSIS — M720 Palmar fascial fibromatosis [Dupuytren]: Secondary | ICD-10-CM

## 2021-09-29 DIAGNOSIS — I2510 Atherosclerotic heart disease of native coronary artery without angina pectoris: Secondary | ICD-10-CM | POA: Diagnosis not present

## 2021-09-29 DIAGNOSIS — C8191 Hodgkin lymphoma, unspecified, lymph nodes of head, face, and neck: Secondary | ICD-10-CM

## 2021-09-29 DIAGNOSIS — I358 Other nonrheumatic aortic valve disorders: Secondary | ICD-10-CM

## 2021-09-29 MED ORDER — RAMIPRIL 10 MG PO CAPS: 10.0000 mg | ORAL_CAPSULE | Freq: Two times a day (BID) | ORAL | 3 refills | Status: DC

## 2021-09-29 MED ORDER — METHOCARBAMOL 500 MG PO TABS: 500.0000 mg | ORAL_TABLET | Freq: Three times a day (TID) | ORAL | 3 refills | Status: DC | PRN

## 2021-09-29 MED ORDER — RAMIPRIL 10 MG PO CAPS: 10.0000 mg | ORAL_CAPSULE | Freq: Two times a day (BID) | ORAL | 0 refills | Status: DC

## 2021-09-29 NOTE — Progress Notes (Signed)
Subjective:   Elijah Peters is a 81 y.o. male who presents for Medicare Annual/Subsequent preventive examination.  Review of Systems           Objective:    Today's Vitals   09/29/21 0904  BP: (!) 150/78  Pulse: 64  Temp: 98.6 F (37 C)  TempSrc: Oral  SpO2: 98%  Weight: 183 lb 4.8 oz (83.1 kg)  Height: 5' 10.5" (1.791 m)   Body mass index is 25.93 kg/m.  Advanced Directives 09/29/2021 09/23/2020 11/12/2019 04/10/2019 04/06/2018 09/23/2016 09/17/2016  Does Patient Have a Medical Advance Directive? Yes Yes Yes Yes Yes Yes Yes  Type of Paramedic of [place];Living will [place];Living will [place];Living will [place];Living will [place];Living will [place];Living will  Does patient want to make changes to medical advance directive? Yes (Inpatient - patient defers changing a medical advance directive and declines information at this time) - No - Patient declined - - No - Patient declined -  Copy of [place] in Chart? Yes - validated most recent copy scanned in chart (See row information) Yes - validated most recent copy scanned in chart (See row information) No - copy requested Yes - validated most recent copy scanned in chart (See row information) Yes No - copy requested No - copy requested  Would patient like information on creating a medical advance directive? - - No - Patient declined - - - -    Current Medications (verified) Outpatient Encounter Medications as of 09/29/2021  Medication Sig   Ascorbic Acid (VITAMIN C) 500 MG CAPS Take by mouth.   aspirin 81 MG tablet Take 81 mg by mouth daily.    atorvastatin (LIPITOR) 80 MG tablet Take 1 tablet (80 mg total) by mouth daily.   B COMPLEX VITAMINS PO Take 1 tablet by mouth daily as needed (energy).    COD LIVER OIL PO Take by mouth. 2 tablets daily    cyclobenzaprine (FLEXERIL) 10 MG tablet Take 1 tablet (10 mg total) by mouth daily as needed for muscle spasms.   ezetimibe (ZETIA) 10 MG tablet Take 1 tablet (10 mg total) by mouth at bedtime.   HYDROcodone-acetaminophen (NORCO/VICODIN) 5-325 MG tablet Take 1 tablet by mouth every 6 (six) hours as needed for moderate pain.   icosapent Ethyl (VASCEPA) 1 g capsule Take 2 capsules (2 g total) by mouth 2 (two) times daily.   metoprolol succinate (TOPROL-XL) 50 MG 24 hr tablet Alternate taking 50 mg (1 tablet) daily and 25 mg (1/2 tablet) daily   Omega-3 Fatty Acids (FISH OIL PO) Take by mouth.   Potassium 99 MG TABS Take 1 tablet by mouth daily as needed (cramps).    ramipril (ALTACE) 10 MG capsule TAKE 1 CAPSULE DAILY   Coenzyme Q10 200 MG TABS Take by mouth. (Patient not taking: Reported on 09/29/2021)   tamsulosin (FLOMAX) 0.4 MG CAPS capsule Take 1 capsule (0.4 mg total) by mouth daily. (Patient not taking: Reported on 09/29/2021)   No facility-administered encounter medications on file as of 09/29/2021.    Allergies (verified) Niacin and related and Niacin   History: Past Medical History:  Diagnosis Date   Actinic keratosis 01/12/2018   Right forehead. EDC   CAD (coronary artery disease)    Cancer (HCC)    LYMPHOMA/ TUMOR LEFT SINUS   Cataract    GERD (gastroesophageal reflux disease)  Heart murmur    History of nuclear stress test 06/2012   normal pattern of perfusion; low risk scan   Hyperlipidemia    Hypertension    LVH (left ventricular hypertrophy)    mild, concentric   Mild aortic sclerosis    Myocardial infarction ([place]) 08/1996   non-Q-wave inferolateral    Non Hodgkin's lymphoma ([place])    Peyronie's disease    Prostate cancer ([place])    Squamous cell carcinoma in situ 07/10/2018   L lat base of neck, lat edge    Past Surgical History:  Procedure Laterality Date   CARDIAC CATHETERIZATION  1992   PTCA to LAD, in [place]  08/06/2010   right eye    CATARACT EXTRACTION W/PHACO Left 09/23/2016   Procedure: CATARACT EXTRACTION PHACO AND INTRAOCULAR LENS PLACEMENT ([place]);  Surgeon: Estill Cotta, MD;  Location: [facility] ORS;  Service: Ophthalmology;  Laterality: Left;  [place] 1:38.5 AP% 25.3 CDE 48.16 Fluid pack lot # [id number]   CORONARY ANGIOPLASTY WITH STENT PLACEMENT  1998   L circumflex - 3.0x23.9x9 bare metal stent   EYE SURGERY  2011   Cataracts removed.   HEMORROIDECTOMY     HERNIA REPAIR  2004   lymphoma removed     PROSTATE SURGERY  03/2009   seed implant due to prostate cancer   TONSILLECTOMY  childhood   TRANSTHORACIC ECHOCARDIOGRAM  09/01/2011   EF=>55%; mild conc LVH; borderline RV enlargement; LA mildly dilated; mild mitral annular calcif; mild-mod MR; RV systolic pressure elevated; AV mildly sclerotic; mild AV regurg; aortic root sclerosis/calcif   Family History  Problem Relation Age of Onset   Emphysema Father    COPD Father    Stroke Mother    Hypertension Brother    Hyperlipidemia Brother    Heart disease Brother        CAD   Social History   Socioeconomic History   Marital status: Married    Spouse name: Not on file   Number of children: 4   Years of education: Not on file   Highest education level: Bachelor's degree (e.g., BA, AB, BS)  Occupational History   Occupation: retired    Fish farm manager: [employer]  Tobacco Use   Smoking status: Former    Years: 9.00    Types: Cigarettes    Quit date: 08/25/1963    Years since quitting: 58.1   Smokeless tobacco: Never  Vaping Use   Vaping Use: Never used  Substance and Sexual Activity   Alcohol use: Yes    Alcohol/week: 2.0 - 4.0 standard drinks    Types: 2 - 3 Glasses of wine per week    Comment: 1/2 glass 2-3xs a week   Drug use: No   Sexual activity: Never  Other Topics Concern   Not on file  Social History Narrative   Not on file   Social Determinants of Health   Financial Resource Strain: Low Risk    Difficulty of Paying Living Expenses: Not  hard at all  Food Insecurity: No Food Insecurity   Worried About Charity fundraiser in the Last Year: Never true   [place] in the Last Year: Never true  Transportation Needs: No Transportation Needs   Lack of Transportation (Medical): No   Lack of Transportation (Non-Medical): No  Physical Activity: Insufficiently Active   Days of Exercise per Week: 3 days   Minutes of Exercise per Session: 30 min  Stress: No Stress Concern Present  Feeling of Stress : Not at all  Social Connections: Moderately Isolated   Frequency of Communication with Friends and Family: More than three times a week   Frequency of Social Gatherings with Friends and Family: Once a week   Attends Religious Services: Never   Marine scientist or Organizations: No   Attends Music therapist: Never   Marital Status: Married    Tobacco Counseling Counseling given: Not Answered   Clinical Intake:  Pre-visit preparation completed: Yes  Pain : No/denies pain     Nutritional Risks: None Diabetes: No  How often do you need to have someone help you when you read instructions, pamphlets, or other written materials from your doctor or pharmacy?: 1 - Never  Diabetic?no  Interpreter Needed?: No  Information entered by :: Kirke Shaggy, LPN   Activities of Daily Living No flowsheet data found.  Patient Care Team: Jerrol Banana., MD as PCP - General (Family Medicine) Troy Sine, MD as PCP - Cardiology (Cardiology) Dingeldein, Remo Lipps, MD as Consulting Physician (Ophthalmology) Myrlene Broker, MD as Attending Physician (Urology) Corey Harold, MD as Referring Physician (Internal Medicine) Ralene Bathe, MD (Dermatology)  Indicate any recent Medical Services you may have received from other than [facility] providers in the past year (date may be approximate).     Assessment:   This is a routine wellness examination for Mikah.  Hearing/Vision screen No results  found.  Dietary issues and exercise activities discussed:     Goals Addressed             This Visit's Progress    Activity and Exercise Increased       Evidence-based guidance:  Review current exercise levels.  Assess patient perspective on exercise or activity level, barriers to increasing activity, motivation and readiness for change.  Recommend or set healthy exercise goal based on individual tolerance.  Encourage small steps toward making change in amount of exercise or activity.  Urge reduction of sedentary activities or screen time.  Promote group activities within the community or with family or support person.  Consider referral to rehabiliation therapist for assessment and exercise/activity plan.   Notes:        Depression Screen PHQ 2/9 Scores 09/29/2021 09/23/2020 04/10/2019 04/06/2018 10/13/2017 09/17/2016 10/09/2015  PHQ - 2 Score 0 0 0 0 0 0 0    Fall Risk Fall Risk  09/29/2021 09/23/2020 07/17/2019 04/10/2019 04/06/2018  Falls in the past year? 0 0 0 0 Yes  Comment - - Emmi Telephone Survey: data to providers prior to load - -  Number falls in past yr: 0 0 - - 1  Injury with Fall? 0 0 - - No  Comment - - - - -  Risk for fall due to : No Fall Risks - - - -  Follow up Falls evaluation completed - - - Falls prevention discussed    FALL RISK PREVENTION PERTAINING TO THE HOME:  Any stairs in or around the home? Yes  If so, are there any without handrails? No  Home free of loose throw rugs in walkways, pet beds, electrical cords, etc? Yes  Adequate lighting in your home to reduce risk of falls? Yes   ASSISTIVE DEVICES UTILIZED TO PREVENT FALLS:  Life alert? No  Use of a cane, walker or w/c? No  Grab bars in the bathroom? No  Shower chair or bench in shower? Yes  Elevated toilet seat or a handicapped toilet? Yes  TIMED UP AND GO:  Was the test performed? Yes .  Length of time to ambulate 10 feet: 4 sec.   Gait steady and fast without use of assistive  device  Cognitive Function:     6CIT Screen 04/10/2019 09/17/2016  What Year? 0 points 0 points  What month? 0 points 0 points  What time? 0 points 0 points  Count back from 20 0 points 0 points  Months in reverse 0 points 0 points  Repeat phrase 0 points 2 points  Total Score 0 2    Immunizations Immunization History  Administered Date(s) Administered   Fluad Quad(high Dose 65+) 05/23/2020   Influenza, High Dose Seasonal PF 04/14/2017, 04/14/2017, 06/25/2018, 06/25/2018, 06/16/2019   Influenza-Unspecified 05/24/2013, 05/30/2015, 08/06/2016   PFIZER Comirnaty(Gray Top)Covid-19 Tri-Sucrose Vaccine 09/29/2019, 10/24/2019, 06/20/2020   PFIZER(Purple Top)SARS-COV-2 Vaccination 09/29/2019, 10/24/2019, 06/20/2020   Pneumococcal Conjugate-13 09/20/2014   Pneumococcal Polysaccharide-23 07/01/2011   Tdap 12/27/2014   Zoster, Live 09/27/2012    TDAP status: Up to date  Flu Vaccine status: Up to date  Pneumococcal vaccine status: Up to date  Covid-19 vaccine status: Completed vaccines  Qualifies for Shingles Vaccine? Yes   Zostavax completed Yes   Shingrix Completed?: No.    Education has been provided regarding the importance of this vaccine. Patient has been advised to call insurance company to determine out of pocket expense if they have not yet received this vaccine. Advised may also receive vaccine at local pharmacy or Health Dept. Verbalized acceptance and understanding.  Screening Tests Health Maintenance  Topic Date Due   Zoster Vaccines- Shingrix (1 of 2) Never done   COVID-19 Vaccine (6 - Booster) 08/15/2020   INFLUENZA VACCINE  03/24/2021   TETANUS/TDAP  12/26/2024   Pneumonia Vaccine 44+ Years old  Completed   HPV VACCINES  Aged Out    Health Maintenance  Health Maintenance Due  Topic Date Due   Zoster Vaccines- Shingrix (1 of 2) Never done   COVID-19 Vaccine (6 - Booster) 08/15/2020   INFLUENZA VACCINE  03/24/2021    Colorectal cancer screening: Type of  screening: Colonoscopy. Completed 01/04/12. Repeat every 10 years- aged out  Lung Cancer Screening: (Low Dose CT Chest recommended if Age 55-80 years, 30 pack-year currently smoking OR have quit w/in 15years.) does not qualify.    Additional Screening:  Hepatitis C Screening: does not qualify; Completed no  Vision Screening: Recommended annual ophthalmology exams for early detection of glaucoma and other disorders of the eye. Is the patient up to date with their annual eye exam?  Yes  Who is the provider or what is the name of the office in which the patient attends annual eye exams? [facility] If pt is not established with a provider, would they like to be referred to a provider to establish care? No .   Dental Screening: Recommended annual dental exams for proper oral hygiene  Community Resource Referral / Chronic Care Management: CRR required this visit?  No   CCM required this visit?  No      Plan:     I have personally reviewed and noted the following in the patients chart:   Medical and social history Use of alcohol, tobacco or illicit drugs  Current medications and supplements including opioid prescriptions. Patient is currently taking opioid prescriptions. Information provided to patient regarding non-opioid alternatives. Patient advised to discuss non-opioid treatment plan with their provider. Functional ability and status Nutritional status Physical activity Advanced directives List of other physicians  Hospitalizations, surgeries, and ER visits in previous 12 months Vitals Screenings to include cognitive, depression, and falls Referrals and appointments  In addition, I have reviewed and discussed with patient certain preventive protocols, quality metrics, and best practice recommendations. A written personalized care plan for preventive services as well as general preventive health recommendations were provided to patient.     Dionisio David,  LPN   08/30/[id number]   Nurse Notes: none

## 2021-09-29 NOTE — Patient Instructions (Signed)
Elijah Peters , Thank you for taking time to come for your Medicare Wellness Visit. I appreciate your ongoing commitment to your health goals. Please review the following plan we discussed and let me know if I can assist you in the future.   Screening recommendations/referrals: Colonoscopy: 01/04/12 Recommended yearly ophthalmology/optometry visit for glaucoma screening and checkup Recommended yearly dental visit for hygiene and checkup  Vaccinations: Influenza vaccine: had one at [facility] Pneumococcal vaccine: 09/20/14 Tdap vaccine: 12/27/14 Shingles vaccine: Zostavax 09/27/12   Covid-19: 09/29/19, 10/24/19, 06/20/20  Advanced directives: yes  Conditions/risks identified: none  Next appointment: Follow up in one year for your annual wellness visit. 09/30/22 @ 10:20 am in person  Preventive Care 65 Years and Older, Male Preventive care refers to lifestyle choices and visits with your health care provider that can promote health and wellness. What does preventive care include? A yearly physical exam. This is also called an annual well check. Dental exams once or twice a year. Routine eye exams. Ask your health care provider how often you should have your eyes checked. Personal lifestyle choices, including: Daily care of your teeth and gums. Regular physical activity. Eating a healthy diet. Avoiding tobacco and drug use. Limiting alcohol use. Practicing safe sex. Taking low doses of aspirin every day. Taking vitamin and mineral supplements as recommended by your health care provider. What happens during an annual well check? The services and screenings done by your health care provider during your annual well check will depend on your age, overall health, lifestyle risk factors, and family history of disease. Counseling  Your health care provider may ask you questions about your: Alcohol use. Tobacco use. Drug use. Emotional well-being. Home and relationship well-being. Sexual  activity. Eating habits. History of falls. Memory and ability to understand (cognition). Work and work Statistician. Screening  You may have the following tests or measurements: Height, weight, and BMI. Blood pressure. Lipid and cholesterol levels. These may be checked every 5 years, or more frequently if you are over 36 years old. Skin check. Lung cancer screening. You may have this screening every year starting at age 53 if you have a 30-pack-year history of smoking and currently smoke or have quit within the past 15 years. Fecal occult blood test (FOBT) of the stool. You may have this test every year starting at age 47. Flexible sigmoidoscopy or colonoscopy. You may have a sigmoidoscopy every 5 years or a colonoscopy every 10 years starting at age 45. Prostate cancer screening. Recommendations will vary depending on your family history and other risks. Hepatitis C blood test. Hepatitis B blood test. Sexually transmitted disease (STD) testing. Diabetes screening. This is done by checking your blood sugar (glucose) after you have not eaten for a while (fasting). You may have this done every 1-3 years. Abdominal aortic aneurysm (AAA) screening. You may need this if you are a current or former smoker. Osteoporosis. You may be screened starting at age 48 if you are at high risk. Talk with your health care provider about your test results, treatment options, and if necessary, the need for more tests. Vaccines  Your health care provider may recommend certain vaccines, such as: Influenza vaccine. This is recommended every year. Tetanus, diphtheria, and acellular pertussis (Tdap, Td) vaccine. You may need a Td booster every 10 years. Zoster vaccine. You may need this after age 67. Pneumococcal 13-valent conjugate (PCV13) vaccine. One dose is recommended after age 70. Pneumococcal polysaccharide (PPSV23) vaccine. One dose is recommended after age 50. Talk  to your health care provider about which  screenings and vaccines you need and how often you need them. This information is not intended to replace advice given to you by your health care provider. Make sure you discuss any questions you have with your health care provider. Document Released: 09/06/2015 Document Revised: 04/29/2016 Document Reviewed: 06/11/2015 Elsevier Interactive Patient Education  2017 [place] Prevention in the Home Falls can cause injuries. They can happen to people of all ages. There are many things you can do to make your home safe and to help prevent falls. What can I do on the outside of my home? Regularly fix the edges of walkways and driveways and fix any cracks. Remove anything that might make you trip as you walk through a door, such as a raised step or threshold. Trim any bushes or trees on the path to your home. Use bright outdoor lighting. Clear any walking paths of anything that might make someone trip, such as rocks or tools. Regularly check to see if handrails are loose or broken. Make sure that both sides of any steps have handrails. Any raised decks and porches should have guardrails on the edges. Have any leaves, snow, or ice cleared regularly. Use sand or salt on walking paths during winter. Clean up any spills in your garage right away. This includes oil or grease spills. What can I do in the bathroom? Use night lights. Install grab bars by the toilet and in the tub and shower. Do not use towel bars as grab bars. Use non-skid mats or decals in the tub or shower. If you need to sit down in the shower, use a plastic, non-slip stool. Keep the floor dry. Clean up any water that spills on the floor as soon as it happens. Remove soap buildup in the tub or shower regularly. Attach bath mats securely with double-sided non-slip rug tape. Do not have throw rugs and other things on the floor that can make you trip. What can I do in the bedroom? Use night lights. Make sure that you have a  light by your bed that is easy to reach. Do not use any sheets or blankets that are too big for your bed. They should not hang down onto the floor. Have a firm chair that has side arms. You can use this for support while you get dressed. Do not have throw rugs and other things on the floor that can make you trip. What can I do in the kitchen? Clean up any spills right away. Avoid walking on wet floors. Keep items that you use a lot in easy-to-reach places. If you need to reach something above you, use a strong step stool that has a grab bar. Keep electrical cords out of the way. Do not use floor polish or wax that makes floors slippery. If you must use wax, use non-skid floor wax. Do not have throw rugs and other things on the floor that can make you trip. What can I do with my stairs? Do not leave any items on the stairs. Make sure that there are handrails on both sides of the stairs and use them. Fix handrails that are broken or loose. Make sure that handrails are as long as the stairways. Check any carpeting to make sure that it is firmly attached to the stairs. Fix any carpet that is loose or worn. Avoid having throw rugs at the top or bottom of the stairs. If you do have throw rugs,  attach them to the floor with carpet tape. Make sure that you have a light switch at the top of the stairs and the bottom of the stairs. If you do not have them, ask someone to add them for you. What else can I do to help prevent falls? Wear shoes that: Do not have high heels. Have rubber bottoms. Are comfortable and fit you well. Are closed at the toe. Do not wear sandals. If you use a stepladder: Make sure that it is fully opened. Do not climb a closed stepladder. Make sure that both sides of the stepladder are locked into place. Ask someone to hold it for you, if possible. Clearly mark and make sure that you can see: Any grab bars or handrails. First and last steps. Where the edge of each step  is. Use tools that help you move around (mobility aids) if they are needed. These include: Canes. Walkers. Scooters. Crutches. Turn on the lights when you go into a dark area. Replace any light bulbs as soon as they burn out. Set up your furniture so you have a clear path. Avoid moving your furniture around. If any of your floors are uneven, fix them. If there are any pets around you, be aware of where they are. Review your medicines with your doctor. Some medicines can make you feel dizzy. This can increase your chance of falling. Ask your doctor what other things that you can do to help prevent falls. This information is not intended to replace advice given to you by your health care provider. Make sure you discuss any questions you have with your health care provider. Document Released: 06/06/2009 Document Revised: 01/16/2016 Document Reviewed: 09/14/2014 Elsevier Interactive Patient Education  2017 [employer].

## 2021-09-29 NOTE — Progress Notes (Signed)
Established patient visit  I,[name],acting as a scribe for [name], MD.,have documented all relevant documentation on the behalf of [name], MD,as directed by  [name], MD while in the presence of [name], MD.   Patient: Elijah Peters   DOB: Aug 11, 1941   81 y.o. Male  MRN: [id number] Visit Date: 09/29/2021  Today's healthcare provider: [name], MD   Chief Complaint  Patient presents with   Follow-up   Hypertension   Hyperlipidemia   Subjective    HPI  Patient had AWV with NHA today at 9:00 am. Overall patient feeling well.  No problems with cardiovascular/heart disease.  Patient does have her current problems with low back strain and muscle spasm and has had Flexeril in the past but his wife has had methocarbamol.  He states the Flexeril made him feel loopy so we will give him low-dose methocarbamol to try to avoid fall risk in this 81 year old.  Hypertension, follow-up  BP Readings from Last 3 Encounters:  09/29/21 (!) 150/78  03/20/21 (!) 150/85  09/23/20 (!) 148/68   Wt Readings from Last 3 Encounters:  09/29/21 183 lb 4.8 oz (83.1 kg)  03/20/21 181 lb (82.1 kg)  09/23/20 180 lb 9.6 oz (81.9 kg)     He was last seen for hypertension 7 months ago.  BP at that visit was 150/85. Management since that visit includes; Fair control. He is on ramipril and metoprolol. He reports good compliance with treatment. He is not having side effects. none He is exercising. He is adherent to low salt diet.   Outside blood pressures are checked occasionally.  He does not smoke.  Use of agents associated with hypertension: none.   --------------------------------------------------------------------------------------------------- Lipid/Cholesterol, follow-up  Last Lipid Panel: Lab Results  Component Value Date   CHOL 154 07/26/2020   LDLCALC 99 07/26/2020   HDL 31 (L) 07/26/2020   TRIG 132 07/26/2020    He  was last seen for this 07/26/2020.  Management since that visit includes; on atorvastatin.  He reports good compliance with treatment. He is not having side effects. none  He is following a Regular diet. Current exercise: walking  Last metabolic panel Lab Results  Component Value Date   GLUCOSE 100 (H) 07/26/2020   NA 140 07/26/2020   K 5.0 07/26/2020   BUN 15 07/26/2020   CREATININE 0.81 07/26/2020   GFRNONAA 83 07/26/2020   CALCIUM 9.3 07/26/2020   AST 23 07/26/2020   ALT 18 07/26/2020   The ASCVD Risk score (Arnett DK, et al., 2019) failed to calculate for the following reasons:   The 2019 ASCVD risk score is only valid for ages 38 to 13  ---------------------------------------------------------------------------------------------------   Medications: Outpatient Medications Prior to Visit  Medication Sig   Ascorbic Acid (VITAMIN C) 500 MG CAPS Take by mouth.   aspirin 81 MG tablet Take 81 mg by mouth daily.    atorvastatin (LIPITOR) 80 MG tablet Take 1 tablet (80 mg total) by mouth daily.   B COMPLEX VITAMINS PO Take 1 tablet by mouth daily as needed (energy).    COD LIVER OIL PO Take by mouth. 2 tablets daily   cyclobenzaprine (FLEXERIL) 10 MG tablet Take 1 tablet (10 mg total) by mouth daily as needed for muscle spasms.   ezetimibe (ZETIA) 10 MG tablet Take 1 tablet (10 mg total) by mouth at bedtime.   HYDROcodone-acetaminophen (NORCO/VICODIN) 5-325 MG tablet Take 1 tablet by mouth  every 6 (six) hours as needed for moderate pain.   icosapent Ethyl (VASCEPA) 1 g capsule Take 2 capsules (2 g total) by mouth 2 (two) times daily.   metoprolol succinate (TOPROL-XL) 50 MG 24 hr tablet Alternate taking 50 mg (1 tablet) daily and 25 mg (1/2 tablet) daily   Omega-3 Fatty Acids (FISH OIL PO) Take by mouth.   Potassium 99 MG TABS Take 1 tablet by mouth daily as needed (cramps).    ramipril (ALTACE) 10 MG capsule TAKE 1 CAPSULE DAILY   tamsulosin (FLOMAX) 0.4 MG CAPS capsule Take  1 capsule (0.4 mg total) by mouth daily. (Patient not taking: Reported on 09/29/2021)   [DISCONTINUED] Coenzyme Q10 200 MG TABS Take by mouth. (Patient not taking: Reported on 09/29/2021)   No facility-administered medications prior to visit.    Review of Systems  Last lipids Lab Results  Component Value Date   CHOL 154 07/26/2020   HDL 31 (L) 07/26/2020   LDLCALC 99 07/26/2020   TRIG 132 07/26/2020   CHOLHDL 5.0 07/26/2020       Objective     Vitals:  BP 150/78 Important   Pulse 64 Temp 98.6 F (37 C) (Oral) Ht 5' 10.5" (1.791 m) Wt 183 lb 4.8 oz (83.1 kg) SpO2 98% BMI 25.93 kg/m BSA 2.03 m    BP Readings from Last 3 Encounters:  09/29/21 (!) 150/78  03/20/21 (!) 150/85  09/23/20 (!) 148/68   Wt Readings from Last 3 Encounters:  09/29/21 183 lb 4.8 oz (83.1 kg)  03/20/21 181 lb (82.1 kg)  09/23/20 180 lb 9.6 oz (81.9 kg)      Physical Exam Vitals reviewed.  Constitutional:      Appearance: He is well-developed.  HENT:     Head: Normocephalic and atraumatic.  Eyes:     Conjunctiva/sclera: Conjunctivae normal.     Pupils: Pupils are equal, round, and reactive to light.  Neck:     Thyroid: No thyromegaly.  Cardiovascular:     Rate and Rhythm: Normal rate and regular rhythm.     Heart sounds: Normal heart sounds.  Pulmonary:     Effort: Pulmonary effort is normal.     Breath sounds: Normal breath sounds.  Abdominal:     Palpations: Abdomen is soft.  Musculoskeletal:     Cervical back: Normal range of motion and neck supple.     Comments: Significant bilateral Dupuytren contracture of both fifth fingers  Lymphadenopathy:     Cervical: No cervical adenopathy.  Skin:    General: Skin is warm and dry.  Neurological:     General: No focal deficit present.     Mental Status: He is alert and oriented to person, place, and time. Mental status is at baseline.     Deep Tendon Reflexes: Reflexes are normal and symmetric.  Psychiatric:        Mood and Affect:  Mood normal.        Behavior: Behavior normal.        Thought Content: Thought content normal.        Judgment: Judgment normal.      No results found for any visits on 09/29/21.  Assessment & Plan     1. Coronary artery disease involving native coronary artery of native heart without angina pectoris All risk factors treated. - Lipid panel - CBC w/Diff/Platelet - Comprehensive Metabolic Panel (CMET)  2. Essential (primary) hypertension Crease ramipril from 10 to 20 mg daily - Lipid panel - CBC w/Diff/Platelet -  Comprehensive Metabolic Panel (CMET)  3. Hypercholesterolemia without hypertriglyceridemia On statin, LDL goal less than 7 - Lipid panel - CBC w/Diff/Platelet - Comprehensive Metabolic Panel (CMET)  4. Back spasm Methocarbamol every 8 hours as needed  5. Hodgkin lymphoma of lymph nodes of neck, unspecified Hodgkin lymphoma type ([place]) Followed by oncology  6. Aortic valve sclerosis Per Dr. Claiborne Billings from cardiology  7. Dupuytren's contracture of both hands Patient wishes to follow for now.  We will be happy to refer to Ortho/hand surgery in the future.   No follow-ups on file.      I, [name], MD, have reviewed all documentation for this visit. The documentation on 09/29/21 for the exam, diagnosis, procedures, and orders are all accurate and complete.    Nakeyia Menden Cranford Mon, MD  [facility] [phone] (phone) [phone] (fax)  [place]

## 2021-10-07 DIAGNOSIS — I2510 Atherosclerotic heart disease of native coronary artery without angina pectoris: Secondary | ICD-10-CM | POA: Diagnosis not present

## 2021-10-07 DIAGNOSIS — I10 Essential (primary) hypertension: Secondary | ICD-10-CM | POA: Diagnosis not present

## 2021-10-07 DIAGNOSIS — E7800 Pure hypercholesterolemia, unspecified: Secondary | ICD-10-CM | POA: Diagnosis not present

## 2021-10-07 LAB — LIPID PANEL

## 2021-10-08 LAB — CBC WITH DIFFERENTIAL/PLATELET
Basophils Absolute: 0 10*3/uL (ref 0.0–0.2)
Basos: 1 %
EOS (ABSOLUTE): 0.1 10*3/uL (ref 0.0–0.4)
Eos: 2 %
Hematocrit: 40.8 % (ref 37.5–51.0)
Hemoglobin: 14.2 g/dL (ref 13.0–17.7)
Immature Grans (Abs): 0 10*3/uL (ref 0.0–0.1)
Immature Granulocytes: 0 %
Lymphocytes Absolute: 2.1 10*3/uL (ref 0.7–3.1)
Lymphs: 39 %
MCH: 32.6 pg (ref 26.6–33.0)
MCHC: 34.8 g/dL (ref 31.5–35.7)
MCV: 94 fL (ref 79–97)
Monocytes Absolute: 0.6 10*3/uL (ref 0.1–0.9)
Monocytes: 10 %
Neutrophils Absolute: 2.6 10*3/uL (ref 1.4–7.0)
Neutrophils: 48 %
Platelets: 173 10*3/uL (ref 150–450)
RBC: 4.35 x10E6/uL (ref 4.14–5.80)
RDW: 11.6 % (ref 11.6–15.4)
WBC: 5.4 10*3/uL (ref 3.4–10.8)

## 2021-10-08 LAB — COMPREHENSIVE METABOLIC PANEL
ALT: 17 IU/L (ref 0–44)
AST: 22 IU/L (ref 0–40)
Albumin/Globulin Ratio: 2 (ref 1.2–2.2)
Albumin: 4.3 g/dL (ref 3.6–4.6)
Alkaline Phosphatase: 96 IU/L (ref 44–121)
BUN/Creatinine Ratio: 16 (ref 10–24)
BUN: 14 mg/dL (ref 8–27)
Bilirubin Total: 0.8 mg/dL (ref 0.0–1.2)
CO2: 22 mmol/L (ref 20–29)
Calcium: 9.3 mg/dL (ref 8.6–10.2)
Chloride: 105 mmol/L (ref 96–106)
Creatinine, Ser: 0.85 mg/dL (ref 0.76–1.27)
Globulin, Total: 2.1 g/dL (ref 1.5–4.5)
Glucose: 99 mg/dL (ref 70–99)
Potassium: 4.9 mmol/L (ref 3.5–5.2)
Sodium: 142 mmol/L (ref 134–144)
Total Protein: 6.4 g/dL (ref 6.0–8.5)
eGFR: 87 mL/min/{1.73_m2} (ref >59)

## 2021-10-08 LAB — LIPID PANEL
Chol/HDL Ratio: 3 ratio (ref 0.0–5.0)
Cholesterol, Total: 88 mg/dL — ABNORMAL LOW (ref 100–199)
HDL: 29 mg/dL — ABNORMAL LOW (ref >39)
LDL Chol Calc (NIH): 44 mg/dL (ref 0–99)
Triglycerides: 70 mg/dL (ref 0–149)
VLDL Cholesterol Cal: 15 mg/dL (ref 5–40)

## 2021-10-20 ENCOUNTER — Ambulatory Visit: Payer: Medicare Other | Admitting: Dermatology

## 2021-10-20 ENCOUNTER — Other Ambulatory Visit: Payer: Self-pay

## 2021-10-20 DIAGNOSIS — L820 Inflamed seborrheic keratosis: Secondary | ICD-10-CM | POA: Diagnosis not present

## 2021-10-20 DIAGNOSIS — L578 Other skin changes due to chronic exposure to nonionizing radiation: Secondary | ICD-10-CM

## 2021-10-20 DIAGNOSIS — Z1283 Encounter for screening for malignant neoplasm of skin: Secondary | ICD-10-CM | POA: Diagnosis not present

## 2021-10-20 DIAGNOSIS — L821 Other seborrheic keratosis: Secondary | ICD-10-CM

## 2021-10-20 DIAGNOSIS — D1800 Hemangioma unspecified site: Secondary | ICD-10-CM

## 2021-10-20 DIAGNOSIS — I8393 Asymptomatic varicose veins of bilateral lower extremities: Secondary | ICD-10-CM

## 2021-10-20 DIAGNOSIS — L814 Other melanin hyperpigmentation: Secondary | ICD-10-CM

## 2021-10-20 DIAGNOSIS — D229 Melanocytic nevi, unspecified: Secondary | ICD-10-CM

## 2021-10-20 NOTE — Patient Instructions (Signed)

## 2021-10-20 NOTE — Progress Notes (Signed)
Follow-Up Visit   Subjective  Elijah Peters is a 81 y.o. male who presents for the following: Annual Exam (Hx AK's, ISK's ). The patient presents for Total-Body Skin Exam (TBSE) for skin cancer screening and mole check.  The patient has spots, moles and lesions to be evaluated, some may be new or changing.  The following portions of the chart were reviewed this encounter and updated as appropriate:   Tobacco   Allergies   Meds   Problems   Med Hx   Surg Hx   Fam Hx      Review of Systems:  No other skin or systemic complaints except as noted in HPI or Assessment and Plan.  Objective  Well appearing patient in no apparent distress; mood and affect are within normal limits.  A full examination was performed including scalp, head, eyes, ears, nose, lips, neck, chest, axillae, abdomen, back, buttocks, bilateral upper extremities, bilateral lower extremities, hands, feet, fingers, toes, fingernails, and toenails. All findings within normal limits unless otherwise noted below.  L forearm x 1, R shoulder x 1, R hand dorsum x 1 Erythematous stuck-on, waxy papule or plaque   Assessment & Plan  Inflamed seborrheic keratosis L forearm x 1, R shoulder x 1, R hand dorsum x 1  RTC if R hand dorsum not resolved in 4 weeks. Consider bx.   Destruction of lesion - L forearm x 1, R shoulder x 1, R hand dorsum x 1 Complexity: simple   Destruction method: cryotherapy   Informed consent: discussed and consent obtained   Timeout:  patient name, date of birth, surgical site, and procedure verified Lesion destroyed using liquid nitrogen: Yes   Region frozen until ice ball extended beyond lesion: Yes   Outcome: patient tolerated procedure well with no complications   Post-procedure details: wound care instructions given    Skin cancer screening  Lentigines - Scattered tan macules - Due to sun exposure - Benign-appearing, observe - Recommend daily broad spectrum sunscreen SPF 30+ to sun-exposed  areas, reapply every 2 hours as needed. - Call for any changes  Seborrheic Keratoses - Stuck-on, waxy, tan-brown papules and/or plaques  - Benign-appearing - Discussed benign etiology and prognosis. - Observe - Call for any changes  Melanocytic Nevi - Tan-brown and/or pink-flesh-colored symmetric macules and papules - Benign appearing on exam today - Observation - Call clinic for new or changing moles - Recommend daily use of broad spectrum spf 30+ sunscreen to sun-exposed areas.   Hemangiomas - Red papules - Discussed benign nature - Observe - Call for any changes  Actinic Damage - Chronic condition, secondary to cumulative UV/sun exposure - diffuse scaly erythematous macules with underlying dyspigmentation - Recommend daily broad spectrum sunscreen SPF 30+ to sun-exposed areas, reapply every 2 hours as needed.  - Staying in the shade or wearing long sleeves, sun glasses (UVA+UVB protection) and wide brim hats (4-inch brim around the entire circumference of the hat) are also recommended for sun protection.  - Call for new or changing lesions.  Varicose Veins/Spider Veins - Dilated blue, purple or red veins at the lower extremities - Reassured - Smaller vessels can be treated by sclerotherapy (a procedure to inject a medicine into the veins to make them disappear) if desired, but the treatment is not covered by insurance. Larger vessels may be covered if symptomatic and we would refer to vascular surgeon if treatment desired.  Skin cancer screening performed today.  Return in about 1 year (around 10/20/2022) for  TBSE.  I, Rudell Cobb, CMA, am acting as scribe for Sarina Ser, MD . Documentation: I have reviewed the above documentation for accuracy and completeness, and I agree with the above.  Sarina Ser, MD

## 2021-10-21 ENCOUNTER — Encounter: Payer: Self-pay | Admitting: Dermatology

## 2021-10-23 ENCOUNTER — Ambulatory Visit: Payer: Medicare Other | Admitting: Family Medicine

## 2021-10-23 ENCOUNTER — Other Ambulatory Visit: Payer: Self-pay

## 2021-10-23 NOTE — Progress Notes (Deleted)
?  ? ?  Argentina Ponder Storey Stangeland,acting as a scribe for [name], MD.,have documented all relevant documentation on the behalf of [name], MD,as directed by  [name], MD while in the presence of [name], MD. ? ? ?Established patient visit ? ? ?Patient: Elijah Peters   DOB: 12/10/1940   81 y.o. Male  MRN: [id number] ?Visit Date: 10/23/2021 ? ?Today's healthcare provider: [name], MD  ? ?No chief complaint on file. ? ?Subjective  ?  ?HPI  ?Patient is an 81 year old male who presents for follow up of chronic health.  He was last seen on 09/29/21 for follow up to his wellness exam with the nurse health advisor.  ? ?Medications: ?Outpatient Medications Prior to Visit  ?Medication Sig  ? Ascorbic Acid (VITAMIN C) 500 MG CAPS Take by mouth.  ? aspirin 81 MG tablet Take 81 mg by mouth daily.   ? atorvastatin (LIPITOR) 80 MG tablet Take 1 tablet (80 mg total) by mouth daily.  ? B COMPLEX VITAMINS PO Take 1 tablet by mouth daily as needed (energy).   ? COD LIVER OIL PO Take by mouth. 2 tablets daily  ? cyclobenzaprine (FLEXERIL) 10 MG tablet Take 1 tablet (10 mg total) by mouth daily as needed for muscle spasms.  ? ezetimibe (ZETIA) 10 MG tablet Take 1 tablet (10 mg total) by mouth at bedtime.  ? HYDROcodone-acetaminophen (NORCO/VICODIN) 5-325 MG tablet Take 1 tablet by mouth every 6 (six) hours as needed for moderate pain.  ? icosapent Ethyl (VASCEPA) 1 g capsule Take 2 capsules (2 g total) by mouth 2 (two) times daily.  ? methocarbamol (ROBAXIN) 500 MG tablet Take 1 tablet (500 mg total) by mouth every 8 (eight) hours as needed for muscle spasms.  ? metoprolol succinate (TOPROL-XL) 50 MG 24 hr tablet Alternate taking 50 mg (1 tablet) daily and 25 mg (1/2 tablet) daily  ? Omega-3 Fatty Acids (FISH OIL PO) Take by mouth.  ? Potassium 99 MG TABS Take 1 tablet by mouth daily as needed (cramps).   ? ramipril (ALTACE) 10 MG capsule Take 1 capsule (10 mg total) by mouth 2 (two) times  daily.  ? tamsulosin (FLOMAX) 0.4 MG CAPS capsule Take 1 capsule (0.4 mg total) by mouth daily. (Patient not taking: Reported on 09/29/2021)  ? ?No facility-administered medications prior to visit.  ? ? ?Review of Systems ? ?{Labs  Heme  Chem  Endocrine  Serology  Results Review (optional):23779} ?  Objective  ?  ?There were no vitals taken for this visit. ?{Show previous vital signs (optional):23777} ? ?Physical Exam  ?*** ? ?No results found for any visits on 10/23/21. ? Assessment & Plan  ?  ? ?*** ? ?No follow-ups on file.  ?   ? ?{provider attestation***:1} ? ? ?Richard Cranford Mon, MD  ?[facility] ?[phone] (phone) ?[phone] (fax) ? ?[facility] ?

## 2021-10-31 ENCOUNTER — Telehealth: Payer: Self-pay | Admitting: Family Medicine

## 2021-10-31 DIAGNOSIS — I10 Essential (primary) hypertension: Secondary | ICD-10-CM

## 2021-10-31 NOTE — Telephone Encounter (Signed)
Eman calling from [facility] is calling for clarification on the ramipril (ALTACE) 10 MG capsule [[id number]] wanting to know which is correct the quantity of the sig ?[phone] ?

## 2021-10-31 NOTE — Telephone Encounter (Signed)
Please advise 

## 2021-11-03 MED ORDER — RAMIPRIL 10 MG PO CAPS: 10.0000 mg | ORAL_CAPSULE | Freq: Two times a day (BID) | ORAL | 3 refills | Status: AC

## 2021-11-03 NOTE — Telephone Encounter (Signed)
Resent rx with correct quantity. ?

## 2021-12-03 DIAGNOSIS — H524 Presbyopia: Secondary | ICD-10-CM | POA: Diagnosis not present

## 2021-12-03 DIAGNOSIS — H16223 Keratoconjunctivitis sicca, not specified as Sjogren's, bilateral: Secondary | ICD-10-CM | POA: Diagnosis not present

## 2021-12-03 DIAGNOSIS — H35351 Cystoid macular degeneration, right eye: Secondary | ICD-10-CM | POA: Diagnosis not present

## 2021-12-08 ENCOUNTER — Other Ambulatory Visit: Payer: Self-pay | Admitting: Family Medicine

## 2021-12-08 NOTE — Telephone Encounter (Signed)
Copied from [place] [id number]. Topic: General - Other ?>> Dec 08, 2021  3:26 PM Tessa Lerner A wrote: ?Reason for CRM: Medication Refill - Medication: metoprolol succinate (TOPROL-XL) 50 MG 24 hr tablet [[id number]]  ? ?Has the patient contacted their pharmacy? Yes.  The patient has been directed to contact their PCP ?(Agent: If no, request that the patient contact the pharmacy for the refill. If patient does not wish to contact the pharmacy document the reason why and proceed with request.) ?(Agent: If yes, when and what did the pharmacy advise?) ? ?Preferred Pharmacy (with phone number or street name): [facility] [place] ?[address] [place] [address] ?Phone: [phone] Fax: [phone] ?Hours: Not open 24 hours ? ? ?Has the patient been seen for an appointment in the last year OR does the patient have an upcoming appointment? Yes.   ? ?Agent: Please be advised that RX refills may take up to 3 business days. We ask that you follow-up with your pharmacy. ?

## 2021-12-09 MED ORDER — METOPROLOL SUCCINATE ER 50 MG PO TB24: ORAL_TABLET | ORAL | 3 refills | Status: DC

## 2021-12-09 NOTE — Telephone Encounter (Signed)
Requested medication (s) are due for refill today: Yes ? ?Requested medication (s) are on the active medication list: Yes ? ?Last refill:  08/06/20 ? ?Future visit scheduled: Yes ? ?Notes to clinic:  Prescription expired, last ordered by Dr. Claiborne Billings. ? ? ? ?Requested Prescriptions  ?Pending Prescriptions Disp Refills  ? metoprolol succinate (TOPROL-XL) 50 MG 24 hr tablet 60 tablet 3  ?  Sig: Alternate taking 50 mg (1 tablet) daily and 25 mg (1/2 tablet) daily  ?  ? Cardiovascular:  Beta Blockers Failed - 12/08/2021  3:46 PM  ?  ?  Failed - Last BP in normal range  ?  BP Readings from Last 1 Encounters:  ?09/29/21 (!) 150/78  ?  ?  ?  ?  Passed - Last Heart Rate in normal range  ?  Pulse Readings from Last 1 Encounters:  ?09/29/21 64  ?  ?  ?  ?  Passed - Valid encounter within last 6 months  ?  Recent Outpatient Visits   ? ?      ? 2 months ago Coronary artery disease involving native coronary artery of native heart without angina pectoris  ? [facility] Jerrol Banana., MD  ? 8 months ago Essential (primary) hypertension  ? [facility] Rosanna Randy, Retia Passe., MD  ? 1 year ago Coronary artery disease involving native coronary artery of native heart without angina pectoris  ? [facility] Jerrol Banana., MD  ? 2 years ago Paronychia of great toe, left  ? [place], PA-C  ? 2 years ago Paronychia of great toe, left  ? [facility] Jerrol Banana., MD  ? ?  ?  ?Future Appointments   ? ?        ? In 1 month Jerrol Banana., MD [facility]  ? In 10 months Ralene Bathe, MD [place]  ? ?  ? ? ?  ?  ?  ? ?

## 2022-01-27 ENCOUNTER — Encounter: Payer: Self-pay | Admitting: Family Medicine

## 2022-01-27 ENCOUNTER — Ambulatory Visit (INDEPENDENT_AMBULATORY_CARE_PROVIDER_SITE_OTHER): Payer: Medicare Other | Admitting: Family Medicine

## 2022-01-27 VITALS — BP 150/80 | HR 76 | Resp 16 | Wt 182.0 lb

## 2022-01-27 DIAGNOSIS — Z8546 Personal history of malignant neoplasm of prostate: Secondary | ICD-10-CM

## 2022-01-27 DIAGNOSIS — M720 Palmar fascial fibromatosis [Dupuytren]: Secondary | ICD-10-CM

## 2022-01-27 DIAGNOSIS — I10 Essential (primary) hypertension: Secondary | ICD-10-CM

## 2022-01-27 DIAGNOSIS — M62830 Muscle spasm of back: Secondary | ICD-10-CM | POA: Diagnosis not present

## 2022-01-27 DIAGNOSIS — I2510 Atherosclerotic heart disease of native coronary artery without angina pectoris: Secondary | ICD-10-CM

## 2022-01-27 DIAGNOSIS — E7800 Pure hypercholesterolemia, unspecified: Secondary | ICD-10-CM

## 2022-01-27 MED ORDER — PREDNISONE 20 MG PO TABS: 20.0000 mg | ORAL_TABLET | Freq: Every day | ORAL | 0 refills | Status: DC

## 2022-01-27 MED ORDER — METOPROLOL SUCCINATE ER 50 MG PO TB24: ORAL_TABLET | ORAL | 3 refills | Status: AC

## 2022-01-27 MED ORDER — CYCLOBENZAPRINE HCL 10 MG PO TABS: 10.0000 mg | ORAL_TABLET | Freq: Every day | ORAL | 1 refills | Status: DC | PRN

## 2022-01-27 NOTE — Patient Instructions (Signed)
Get [employer] or [employer] blood pressure cuff for home use.  Check home blood pressures and bring in readings on next visit.

## 2022-01-27 NOTE — Progress Notes (Signed)
Established patient visit  I,[name],acting as a scribe for Elijah Durie, MD.,have documented all relevant documentation on the behalf of Elijah Durie, MD,as directed by  Elijah Durie, MD while in the presence of Elijah Durie, MD.   Patient: Elijah Peters   DOB: Mar 26, 1941   81 y.o. Male  MRN: [id number] Visit Date: 01/27/2022  Today's healthcare provider: [name], MD   Chief Complaint  Patient presents with   Follow-up   Hypertension   Subjective    HPI  Patient comes in today for follow-up.  He is not checking his blood pressure at home.  He has had some recent back pain and was taking some method carbinol but had mental status side effects with confusion.Marland Kitchen  His back continues to hurt but is doing better. He also complains today of Dupuytren's contracture of both fifth fingers.  It is fairly significant at this point in time with him being flexed  about 90 degrees.  Hypertension, follow-up  BP Readings from Last 3 Encounters:  01/27/22 (!) 150/80  09/29/21 (!) 150/78  03/20/21 (!) 150/85   Wt Readings from Last 3 Encounters:  01/27/22 182 lb (82.6 kg)  09/29/21 183 lb 4.8 oz (83.1 kg)  03/20/21 181 lb (82.1 kg)     He was last seen for hypertension 4 months ago.  Management since that visit includes; increased ramipril from 10 to 20 mg daily.  Outside blood pressures are not checking.  Pertinent labs Lab Results  Component Value Date   CHOL 88 (L) 10/07/2021   HDL 29 (L) 10/07/2021   LDLCALC 44 10/07/2021   TRIG 70 10/07/2021   CHOLHDL 3.0 10/07/2021   Lab Results  Component Value Date   NA 142 10/07/2021   K 4.9 10/07/2021   CREATININE 0.85 10/07/2021   EGFR 87 10/07/2021   GLUCOSE 99 10/07/2021   TSH 2.110 07/26/2020     The ASCVD Risk score (Arnett DK, et al., 2019) failed to calculate for the following reasons:   The 2019 ASCVD risk score is only valid for ages 4 to  3  ---------------------------------------------------------------------------------------------------   Medications: Outpatient Medications Prior to Visit  Medication Sig   Ascorbic Acid (VITAMIN C) 500 MG CAPS Take by mouth.   aspirin 81 MG tablet Take 81 mg by mouth daily.    atorvastatin (LIPITOR) 80 MG tablet Take 1 tablet (80 mg total) by mouth daily.   B COMPLEX VITAMINS PO Take 1 tablet by mouth daily as needed (energy).    COD LIVER OIL PO Take by mouth. 2 tablets daily   ezetimibe (ZETIA) 10 MG tablet Take 1 tablet (10 mg total) by mouth at bedtime.   HYDROcodone-acetaminophen (NORCO/VICODIN) 5-325 MG tablet Take 1 tablet by mouth every 6 (six) hours as needed for moderate pain.   icosapent Ethyl (VASCEPA) 1 g capsule Take 2 capsules (2 g total) by mouth 2 (two) times daily.   methocarbamol (ROBAXIN) 500 MG tablet Take 1 tablet (500 mg total) by mouth every 8 (eight) hours as needed for muscle spasms.   metoprolol succinate (TOPROL-XL) 50 MG 24 hr tablet Alternate taking 50 mg (1 tablet) daily and 25 mg (1/2 tablet) daily   Omega-3 Fatty Acids (FISH OIL PO) Take by mouth.   Potassium 99 MG TABS Take 1 tablet by mouth daily as needed (cramps).    ramipril (ALTACE) 10 MG capsule Take 1 capsule (10 mg total) by mouth 2 (two) times  daily.   [DISCONTINUED] cyclobenzaprine (FLEXERIL) 10 MG tablet Take 1 tablet (10 mg total) by mouth daily as needed for muscle spasms. (Patient not taking: Reported on 01/27/2022)   [DISCONTINUED] tamsulosin (FLOMAX) 0.4 MG CAPS capsule Take 1 capsule (0.4 mg total) by mouth daily. (Patient not taking: Reported on 09/29/2021)   No facility-administered medications prior to visit.    Review of Systems  Constitutional:  Negative for appetite change, chills and fever.  Respiratory:  Negative for chest tightness, shortness of breath and wheezing.   Cardiovascular:  Negative for chest pain and palpitations.  Gastrointestinal:  Negative for abdominal pain,  nausea and vomiting.      Objective    BP (!) 150/80 (BP Location: Right Arm, Patient Position: Sitting, Cuff Size: Normal)   Pulse 76   Resp 16   Wt 182 lb (82.6 kg)   SpO2 96%   BMI 25.75 kg/m    Physical Exam Vitals reviewed.  Constitutional:      General: He is not in acute distress.    Appearance: He is well-developed.  HENT:     Head: Normocephalic and atraumatic.     Right Ear: Hearing normal.     Left Ear: Hearing normal.     Nose: Nose normal.  Eyes:     General: Lids are normal. No scleral icterus.       Right eye: No discharge.        Left eye: No discharge.     Conjunctiva/sclera: Conjunctivae normal.  Cardiovascular:     Rate and Rhythm: Normal rate and regular rhythm.     Heart sounds: Normal heart sounds.  Pulmonary:     Effort: Pulmonary effort is normal. No respiratory distress.  Musculoskeletal:        General: No swelling or tenderness.     Right lower leg: No edema.     Left lower leg: No edema.     Comments: Significant Dupuytren's contracture of both fifth fingers  Skin:    Findings: No lesion or rash.  Neurological:     General: No focal deficit present.     Mental Status: He is alert and oriented to person, place, and time.  Psychiatric:        Mood and Affect: Mood normal.        Speech: Speech normal.        Behavior: Behavior normal.        Thought Content: Thought content normal.        Judgment: Judgment normal.       No results found for any visits on 01/27/22.  Assessment & Plan     1. Essential (primary) hypertension Good control - metoprolol succinate (TOPROL-XL) 50 MG 24 hr tablet; Alternate taking 50 mg (1 tablet) daily and 25 mg (1/2 tablet) daily  Dispense: 180 tablet; Refill: 3  2. Dupuytren's contracture of both hands  - AMB referral to orthopedics  3. Back spasm  - predniSONE (DELTASONE) 20 MG tablet; Take 1 tablet (20 mg total) by mouth daily with breakfast.  Dispense: 5 tablet; Refill: 0 - cyclobenzaprine  (FLEXERIL) 10 MG tablet; Take 1 tablet (10 mg total) by mouth daily as needed for muscle spasms.  Dispense: 90 tablet; Refill: 1  4.Coronary artery disease involving native coronary artery of native heart without angina pectoris All risk factors treated.  Followed by cardiology  5. History of prostate cancer   6. Hypercholesterolemia without hypertriglyceridemia On statin   Return in about 3 months (around  04/29/2022).      I, Elijah Durie, MD, have reviewed all documentation for this visit. The documentation on 02/02/22 for the exam, diagnosis, procedures, and orders are all accurate and complete.    Beula Joyner Cranford Mon, MD  [facility] [phone] (phone) [phone] (fax)  [place]

## 2022-02-02 ENCOUNTER — Other Ambulatory Visit: Payer: Self-pay | Admitting: *Deleted

## 2022-02-02 ENCOUNTER — Ambulatory Visit: Payer: Self-pay | Admitting: *Deleted

## 2022-02-02 NOTE — Telephone Encounter (Signed)
Requested medication (s) are due for refill today: yes  Requested medication (s) are on the active medication list: yes    Last refill: 03/20/21  #30  0 refills  Future visit scheduled Yes 05/21/22  Notes to clinic:Not delegated. Please see earlier triage encounter. Thank you.  Requested Prescriptions  Pending Prescriptions Disp Refills   HYDROcodone-acetaminophen (NORCO/VICODIN) 5-325 MG tablet 30 tablet 0    Sig: Take 1 tablet by mouth every 6 (six) hours as needed for moderate pain.     Not Delegated - Analgesics:  Opioid Agonist Combinations Failed - 02/02/2022 12:16 PM      Failed - This refill cannot be delegated      Failed - Urine Drug Screen completed in last 360 days      Passed - Valid encounter within last 3 months    Recent Outpatient Visits           6 days ago Essential (primary) hypertension   [facility] Jerrol Banana., MD   4 months ago Coronary artery disease involving native coronary artery of native heart without angina pectoris   [facility] Jerrol Banana., MD   10 months ago Essential (primary) hypertension   [facility] Rosanna Randy, Retia Passe., MD   1 year ago Coronary artery disease involving native coronary artery of native heart without angina pectoris   [facility] Jerrol Banana., MD   2 years ago Paronychia of great toe, left   [place], Vickki Muff, [place]       Future Appointments             In 3 months Jerrol Banana., MD [facility], [place]   In 8 months Ralene Bathe, MD [place]

## 2022-02-02 NOTE — Telephone Encounter (Signed)
Pt called and stated that he would like a call back from a nurse to explain recent medication. He is not sure how he should be taking them. Please advise      Chief Complaint: How to take Med Symptoms: Questioning how to take prednisone prescribed 01/27/22. Advised as ordered, Once daily with breakfast for 5 days. States concerned he does not have Hydrocodone for breakthrough pain.Advised Flexeril refilled 01/27/22. Requesting refill of Hydrocodone if appropriate. Will route to [facility] refill pool for PCPs reviews. Frequency:  Pertinent Negatives: Patient denies  Disposition: '[]'$ ED /'[]'$ Urgent Care (no appt availability in office) / '[]'$ Appointment(In office/virtual)/ '[]'$  [place] [facility]/ '[]'$ Home Care/ '[]'$ Refused Recommended Disposition /'[]'$ [place] [facility]/ '[x]'$  Follow-up with PCP Additional Notes:  Answer Assessment - Initial Assessment Questions 1. NAME of MEDICATION: "What medicine are you calling about?"     Prednisone 2. QUESTION: "What is your question?" (e.g., double dose of medicine, side effect)     How to take 3. PRESCRIBING HCP: "Who prescribed it?" Reason: if prescribed by specialist, call should be referred to that group.     Dr. Rosanna Randy  Protocols used: Medication Question Call-A-AH

## 2022-02-02 NOTE — Telephone Encounter (Signed)
Please advise refill for hydrocodone?

## 2022-02-03 ENCOUNTER — Other Ambulatory Visit: Payer: Self-pay | Admitting: Family Medicine

## 2022-02-03 MED ORDER — HYDROCODONE-ACETAMINOPHEN 5-325 MG PO TABS: 1.0000 | ORAL_TABLET | Freq: Four times a day (QID) | ORAL | 0 refills | Status: DC | PRN

## 2022-02-12 ENCOUNTER — Other Ambulatory Visit: Payer: Self-pay | Admitting: Family Medicine

## 2022-02-12 DIAGNOSIS — I10 Essential (primary) hypertension: Secondary | ICD-10-CM

## 2022-02-12 NOTE — Telephone Encounter (Signed)
Refilled 01/27/22 # 180 with 3 refills

## 2022-02-12 NOTE — Telephone Encounter (Unsigned)
Copied from [place] [id number]. Topic: General - Other >> Feb 12, 2022  1:02 PM Everette C wrote: Reason for CRM: Medication Refill - Medication: metoprolol succinate (TOPROL-XL) 50 MG 24 hr tablet [[id number]]   Has the patient contacted their pharmacy? Yes.   (Agent: If no, request that the patient contact the pharmacy for the refill. If patient does not wish to contact the pharmacy document the reason why and proceed with request.) (Agent: If yes, when and what did the pharmacy advise?)  Preferred Pharmacy (with phone number or street name): [facility]) [place], [place] [address] Phone: [phone] Fax: [phone] Hours: Not open 24 hours   Has the patient been seen for an appointment in the last year OR does the patient have an upcoming appointment? Yes.    Agent: Please be advised that RX refills may take up to 3 business days. We ask that you follow-up with your pharmacy.

## 2022-03-03 DIAGNOSIS — M720 Palmar fascial fibromatosis [Dupuytren]: Secondary | ICD-10-CM | POA: Diagnosis not present

## 2022-04-10 DIAGNOSIS — G8918 Other acute postprocedural pain: Secondary | ICD-10-CM | POA: Diagnosis not present

## 2022-04-10 DIAGNOSIS — M720 Palmar fascial fibromatosis [Dupuytren]: Secondary | ICD-10-CM | POA: Diagnosis not present

## 2022-04-14 DIAGNOSIS — M720 Palmar fascial fibromatosis [Dupuytren]: Secondary | ICD-10-CM | POA: Diagnosis not present

## 2022-04-21 DIAGNOSIS — M720 Palmar fascial fibromatosis [Dupuytren]: Secondary | ICD-10-CM | POA: Diagnosis not present

## 2022-04-28 DIAGNOSIS — M720 Palmar fascial fibromatosis [Dupuytren]: Secondary | ICD-10-CM | POA: Diagnosis not present

## 2022-05-07 DIAGNOSIS — M720 Palmar fascial fibromatosis [Dupuytren]: Secondary | ICD-10-CM | POA: Diagnosis not present

## 2022-05-14 DIAGNOSIS — M720 Palmar fascial fibromatosis [Dupuytren]: Secondary | ICD-10-CM | POA: Diagnosis not present

## 2022-05-20 NOTE — Progress Notes (Unsigned)
Complete physical exam  I,Joseline E Rosas,acting as a scribe for [employer], MD.,have documented all relevant documentation on the behalf of Eulis Foster, MD,as directed by  Eulis Foster, MD while in the presence of Eulis Foster, MD.   Patient: Elijah Peters   DOB: 07-26-1941   81 y.o. Male  MRN: [id number] Visit Date: 05/21/2022  Today's healthcare provider: Eulis Foster, MD   No chief complaint on file.  Subjective    Elijah Peters is a 81 y.o. male who presents today for a complete physical exam.  He reports consuming a general diet. Home exercise routine includes walking about a mile in half 2-3 times a week. He generally feels well. He reports sleeping fairly well. He does not have additional problems to discuss today.   Had AWV with [facility] 09/29/2021  Past Medical History:  Diagnosis Date   Actinic keratosis 01/12/2018   Right forehead. EDC   CAD (coronary artery disease)    Cancer (HCC)    LYMPHOMA/ TUMOR LEFT SINUS   Cataract    GERD (gastroesophageal reflux disease)    Heart murmur    History of nuclear stress test 06/2012   normal pattern of perfusion; low risk scan   Hyperlipidemia    Hypertension    LVH (left ventricular hypertrophy)    mild, concentric   Mild aortic sclerosis    Myocardial infarction ([place]) 08/1996   non-Q-wave inferolateral    Non Hodgkin's lymphoma ([place])    Peyronie's disease    Prostate cancer ([place])    Squamous cell carcinoma in situ 07/10/2018   L lat base of neck, lat edge    Past Surgical History:  Procedure Laterality Date   CARDIAC CATHETERIZATION  1992   PTCA to LAD, in [place]  08/06/2010   right eye   CATARACT EXTRACTION W/PHACO Left 09/23/2016   Procedure: CATARACT EXTRACTION PHACO AND INTRAOCULAR LENS PLACEMENT ([place]);  Surgeon: Estill Cotta, MD;  Location: [facility] ORS;  Service: Ophthalmology;  Laterality: Left;  [place] 1:38.5 AP%  25.3 CDE 48.16 Fluid pack lot # [id number]   CORONARY ANGIOPLASTY WITH STENT PLACEMENT  1998   L circumflex - 3.0x23.9x9 bare metal stent   EYE SURGERY  2011   Cataracts removed.   HEMORROIDECTOMY     HERNIA REPAIR  2004   lymphoma removed     PROSTATE SURGERY  03/2009   seed implant due to prostate cancer   TONSILLECTOMY  childhood   TRANSTHORACIC ECHOCARDIOGRAM  09/01/2011   EF=>55%; mild conc LVH; borderline RV enlargement; LA mildly dilated; mild mitral annular calcif; mild-mod MR; RV systolic pressure elevated; AV mildly sclerotic; mild AV regurg; aortic root sclerosis/calcif   Social History   Socioeconomic History   Marital status: Married    Spouse name: Not on file   Number of children: 4   Years of education: Not on file   Highest education level: Bachelor's degree (e.g., BA, AB, BS)  Occupational History   Occupation: retired    Fish farm manager: [employer]  Tobacco Use   Smoking status: Former    Years: 9.00    Types: Cigarettes    Quit date: 08/25/1963    Years since quitting: 58.7   Smokeless tobacco: Never  Vaping Use   Vaping Use: Never used  Substance and Sexual Activity   Alcohol use: Yes    Alcohol/week: 2.0 - 4.0 standard drinks of alcohol    Types: 2 - 3 Glasses of wine per week  Comment: 1/2 glass 2-3xs a week   Drug use: No   Sexual activity: Never  Other Topics Concern   Not on file  Social History Narrative   Not on file   Social Determinants of Health   Financial Resource Strain: Low Risk  (09/29/2021)   Overall Financial Resource Strain (CARDIA)    Difficulty of Paying Living Expenses: Not hard at all  Food Insecurity: No Food Insecurity (09/29/2021)   Hunger Vital Sign    Worried About Running Out of Food in the Last Year: Never true    Ran Out of Food in the Last Year: Never true  Transportation Needs: No Transportation Needs (09/29/2021)   PRAPARE - Hydrologist (Medical): No    Lack of Transportation  (Non-Medical): No  Physical Activity: Insufficiently Active (09/29/2021)   Exercise Vital Sign    Days of Exercise per Week: 3 days    Minutes of Exercise per Session: 30 min  Stress: No Stress Concern Present (09/29/2021)   [place]    Feeling of Stress : Not at all  Social Connections: Moderately Isolated (09/29/2021)   Social Connection and Isolation Panel [NHANES]    Frequency of Communication with Friends and Family: More than three times a week    Frequency of Social Gatherings with Friends and Family: Once a week    Attends Religious Services: Never    Marine scientist or Organizations: No    Attends Archivist Meetings: Never    Marital Status: Married  Human resources officer Violence: Not At Risk (09/29/2021)   Humiliation, Afraid, Rape, and Kick questionnaire    Fear of Current or Ex-Partner: No    Emotionally Abused: No    Physically Abused: No    Sexually Abused: No   Family Status  Relation Name Status   Father  Deceased at age 80       emphysema/COPD   Mother  Deceased at age 38       cva; overdose when pt was 76   Brother  Alive   Family History  Problem Relation Age of Onset   Emphysema Father    COPD Father    Stroke Mother    Hypertension Brother    Hyperlipidemia Brother    Heart disease Brother        CAD   Allergies  Allergen Reactions   Niacin And Related Itching   Methocarbamol Anxiety and Other (See Comments)    Mental status cjhanges   Niacin Itching    Patient Care Team: Eulis Foster, MD as PCP - General (Family Medicine) Troy Sine, MD as PCP - Cardiology (Cardiology) Dingeldein, Remo Lipps, MD as Consulting Physician (Ophthalmology) Myrlene Broker, MD as Attending Physician (Urology) Corey Harold, MD as Referring Physician (Internal Medicine) Ralene Bathe, MD (Dermatology)   Medications: Outpatient Medications Prior to Visit  Medication Sig    Ascorbic Acid (VITAMIN C) 500 MG CAPS Take by mouth.   aspirin 81 MG tablet Take 81 mg by mouth daily.    atorvastatin (LIPITOR) 80 MG tablet Take 1 tablet (80 mg total) by mouth daily.   B COMPLEX VITAMINS PO Take 1 tablet by mouth daily as needed (energy).    COD LIVER OIL PO Take by mouth. 2 tablets daily   cyclobenzaprine (FLEXERIL) 10 MG tablet Take 1 tablet (10 mg total) by mouth daily as needed for muscle spasms.   ezetimibe (ZETIA)  10 MG tablet Take 1 tablet (10 mg total) by mouth at bedtime.   HYDROcodone-acetaminophen (NORCO/VICODIN) 5-325 MG tablet Take 1 tablet by mouth every 6 (six) hours as needed for moderate pain.   icosapent Ethyl (VASCEPA) 1 g capsule Take 2 capsules (2 g total) by mouth 2 (two) times daily.   metoprolol succinate (TOPROL-XL) 50 MG 24 hr tablet Alternate taking 50 mg (1 tablet) daily and 25 mg (1/2 tablet) daily   Omega-3 Fatty Acids (FISH OIL PO) Take by mouth.   Potassium 99 MG TABS Take 1 tablet by mouth daily as needed (cramps).    ramipril (ALTACE) 10 MG capsule Take 1 capsule (10 mg total) by mouth 2 (two) times daily.   [DISCONTINUED] ibuprofen (ADVIL) 600 MG tablet Take 600 mg by mouth 3 (three) times daily. (Patient not taking: Reported on 05/21/2022)   [DISCONTINUED] predniSONE (DELTASONE) 20 MG tablet Take 1 tablet (20 mg total) by mouth daily with breakfast.   No facility-administered medications prior to visit.    Review of Systems  Musculoskeletal:        Dupuytren's surgery 3 weeks ago  All other systems reviewed and are negative.     Objective     BP (!) 146/88 (BP Location: Left Arm, Patient Position: Sitting, Cuff Size: Normal)   Pulse 64   Temp 98.7 F (37.1 C) (Oral)   Resp 16   Ht 5' 10.75" (1.797 m)   Wt 186 lb 9.6 oz (84.6 kg)   BMI 26.21 kg/m     Physical Exam Vitals reviewed.  Constitutional:      General: He is not in acute distress.    Appearance: Normal appearance. He is not ill-appearing, toxic-appearing or  diaphoretic.  HENT:     Head: Normocephalic and atraumatic.     Right Ear: Tympanic membrane and external ear normal.     Left Ear: Tympanic membrane and external ear normal.     Nose: Nose normal.     Mouth/Throat:     Mouth: Mucous membranes are moist.     Pharynx: No oropharyngeal exudate or posterior oropharyngeal erythema.  Eyes:     General: No scleral icterus.       Right eye: No discharge.        Left eye: No discharge.     Extraocular Movements: Extraocular movements intact.     Conjunctiva/sclera: Conjunctivae normal.     Pupils: Pupils are equal, round, and reactive to light.  Cardiovascular:     Rate and Rhythm: Normal rate and regular rhythm.     Pulses: Normal pulses.     Heart sounds: Murmur heard.     No friction rub. No gallop.  Pulmonary:     Effort: Pulmonary effort is normal. No respiratory distress.     Breath sounds: Normal breath sounds. No stridor. No wheezing, rhonchi or rales.  Abdominal:     General: Bowel sounds are normal. There is no distension.     Palpations: Abdomen is soft. There is no mass.     Tenderness: There is no abdominal tenderness. There is no guarding.  Musculoskeletal:        General: No swelling, tenderness or signs of injury. Normal range of motion.     Cervical back: Normal range of motion and neck supple.     Right lower leg: No edema.     Left lower leg: No edema.     Comments: Post surgical changes on left hand for dupuytren's contracture  Skin:    General: Skin is warm and dry.     Findings: No erythema or rash.  Neurological:     Mental Status: He is alert and oriented to person, place, and time.     Cranial Nerves: No cranial nerve deficit.     Motor: No weakness.     Gait: Gait normal.       Last depression screening scores    05/21/2022    9:06 AM 09/29/2021    9:11 AM 09/23/2020    9:11 AM  PHQ 2/9 Scores  PHQ - 2 Score 0 0 0  PHQ- 9 Score 0     Last fall risk screening    05/21/2022    9:06 AM  [place] in the past year? 0  Number falls in past yr: 0  Injury with Fall? 0  Risk for fall due to : No Fall Risks   Last Audit-C alcohol use screening    09/29/2021    9:09 AM  Alcohol Use Disorder Test (AUDIT)  1. How often do you have a drink containing alcohol? 0  2. How many drinks containing alcohol do you have on a typical day when you are drinking? 0  3. How often do you have six or more drinks on one occasion? 0  AUDIT-C Score 0   A score of 3 or more in women, and 4 or more in men indicates increased risk for alcohol abuse, EXCEPT if all of the points are from question 1   No results found for any visits on 05/21/22.  Assessment & Plan    Routine Health Maintenance and Physical Exam  Exercise Activities and Dietary recommendations  Goals      Activity and Exercise Increased     Evidence-based guidance:  Review current exercise levels.  Assess patient perspective on exercise or activity level, barriers to increasing activity, motivation and readiness for change.  Recommend or set healthy exercise goal based on individual tolerance.  Encourage small steps toward making change in amount of exercise or activity.  Urge reduction of sedentary activities or screen time.  Promote group activities within the community or with family or support person.  Consider referral to rehabiliation therapist for assessment and exercise/activity plan.   Notes:      Increase water intake     Recommend to drink at least 6-8 8oz glasses of water per day.        Immunization History  Administered Date(s) Administered   Fluad Quad(high Dose 65+) 05/23/2020, 05/21/2022   Influenza, High Dose Seasonal PF 04/14/2017, 04/14/2017, 06/25/2018, 06/25/2018, 06/16/2019   Influenza-Unspecified 05/24/2013, 05/30/2015, 08/06/2016   PFIZER Comirnaty(Gray Top)Covid-19 Tri-Sucrose Vaccine 09/29/2019, 10/24/2019, 06/20/2020   PFIZER(Purple Top)SARS-COV-2 Vaccination 09/29/2019, 10/24/2019, 06/20/2020    Pneumococcal Conjugate-13 09/20/2014   Pneumococcal Polysaccharide-23 07/01/2011   Tdap 12/27/2014   Zoster, Live 09/27/2012    Health Maintenance  Topic Date Due   Zoster Vaccines- Shingrix (1 of 2) Never done   COVID-19 Vaccine (7 - Pfizer risk series) 06/06/2022 (Originally 08/15/2020)   TETANUS/TDAP  12/26/2024   Pneumonia Vaccine 25+ Years old  Completed   INFLUENZA VACCINE  Completed   HPV VACCINES  Aged Out    Discussed health benefits of physical activity, and encouraged him to engage in regular exercise appropriate for his age and condition.  Problem List Items Addressed This Visit       Cardiovascular and Mediastinum   CAD (coronary artery disease)  Chronic, stable  Follows with Dr. Claiborne Billings for cardiology care  Will continue Vascepa 2g twice daily and Zetia '10mg'$  daily and atorvastatin '80mg'$  daily  Patient reports cardiologists prefers to check patient's lipids He will continue ASA '81mg'$        HTN (hypertension) - Primary    Chronic, controlled  BP slightly elevated systolic today  Will check BMP  No changes to medications at this time, he will continue metoprolol '50mg'$  daily  Followed by Cardiology, Dr. Claiborne Billings        Relevant Orders   Basic Metabolic Panel (BMET)     Nervous and Auditory   Papilledema    Follows with Dr. Sandra Cockayne for ophthalmology         Musculoskeletal and Integument   Contracture of palmar fascia (Dupuytren's)    S/p surgical repair  Wound appears to be healing well  Follows with Ortho  Pain is well controlled per patient         Other   Hyperlipidemia with target LDL less than 70    Chronic, on goal therapies  Followed by Dr. Claiborne Billings, cardiology Continue current medications as listed under CAD        Need for influenza vaccination    Vaccine administered today        Relevant Orders   Flu Vaccine QUAD High Dose(Fluad) (Completed)    Return in about 1 year (around 05/22/2023) for CPE.     I, Eulis Foster, MD, have reviewed all documentation for this visit. The documentation on 05/21/22 for the exam, diagnosis, procedures, and orders are all accurate and complete.    Eulis Foster, MD  [facility] [phone] (phone) [phone] (fax)  [place]

## 2022-05-21 ENCOUNTER — Ambulatory Visit: Payer: Medicare Other | Admitting: Family Medicine

## 2022-05-21 ENCOUNTER — Ambulatory Visit (INDEPENDENT_AMBULATORY_CARE_PROVIDER_SITE_OTHER): Payer: Medicare Other | Admitting: Family Medicine

## 2022-05-21 ENCOUNTER — Encounter: Payer: Self-pay | Admitting: Family Medicine

## 2022-05-21 VITALS — BP 146/88 | HR 64 | Temp 98.7°F | Resp 16 | Ht 70.75 in | Wt 186.6 lb

## 2022-05-21 DIAGNOSIS — E785 Hyperlipidemia, unspecified: Secondary | ICD-10-CM | POA: Diagnosis not present

## 2022-05-21 DIAGNOSIS — I10 Essential (primary) hypertension: Secondary | ICD-10-CM

## 2022-05-21 DIAGNOSIS — Z23 Encounter for immunization: Secondary | ICD-10-CM | POA: Diagnosis not present

## 2022-05-21 DIAGNOSIS — H4710 Unspecified papilledema: Secondary | ICD-10-CM

## 2022-05-21 DIAGNOSIS — M720 Palmar fascial fibromatosis [Dupuytren]: Secondary | ICD-10-CM

## 2022-05-21 DIAGNOSIS — I2510 Atherosclerotic heart disease of native coronary artery without angina pectoris: Secondary | ICD-10-CM

## 2022-05-21 NOTE — Assessment & Plan Note (Signed)
Chronic, on goal therapies  Followed by Dr. Claiborne Billings, cardiology Continue current medications as listed under CAD

## 2022-05-21 NOTE — Assessment & Plan Note (Signed)
Vaccine administered today

## 2022-05-21 NOTE — Assessment & Plan Note (Signed)
Follows with Dr. Sandra Cockayne for ophthalmology

## 2022-05-21 NOTE — Assessment & Plan Note (Signed)
Chronic, controlled  BP slightly elevated systolic today  Will check BMP  No changes to medications at this time, he will continue metoprolol '50mg'$  daily  Followed by Cardiology, Dr. Claiborne Billings

## 2022-05-21 NOTE — Assessment & Plan Note (Signed)
Chronic, stable  Follows with Dr. Claiborne Billings for cardiology care  Will continue Vascepa 2g twice daily and Zetia '10mg'$  daily and atorvastatin '80mg'$  daily  Patient reports cardiologists prefers to check patient's lipids He will continue ASA '81mg'$ 

## 2022-05-21 NOTE — Assessment & Plan Note (Signed)
S/p surgical repair  Wound appears to be healing well  Follows with Ortho  Pain is well controlled per patient

## 2022-05-22 LAB — BASIC METABOLIC PANEL
BUN/Creatinine Ratio: 18 (ref 10–24)
BUN: 17 mg/dL (ref 8–27)
CO2: 25 mmol/L (ref 20–29)
Calcium: 9.5 mg/dL (ref 8.6–10.2)
Chloride: 103 mmol/L (ref 96–106)
Creatinine, Ser: 0.95 mg/dL (ref 0.76–1.27)
Glucose: 96 mg/dL (ref 70–99)
Potassium: 4.9 mmol/L (ref 3.5–5.2)
Sodium: 140 mmol/L (ref 134–144)
eGFR: 79 mL/min/{1.73_m2} (ref >59)

## 2022-05-26 DIAGNOSIS — M720 Palmar fascial fibromatosis [Dupuytren]: Secondary | ICD-10-CM | POA: Diagnosis not present

## 2022-06-09 DIAGNOSIS — M720 Palmar fascial fibromatosis [Dupuytren]: Secondary | ICD-10-CM | POA: Diagnosis not present

## 2022-06-16 DIAGNOSIS — H35371 Puckering of macula, right eye: Secondary | ICD-10-CM | POA: Diagnosis not present

## 2022-06-17 DIAGNOSIS — H524 Presbyopia: Secondary | ICD-10-CM | POA: Diagnosis not present

## 2022-06-18 DIAGNOSIS — R350 Frequency of micturition: Secondary | ICD-10-CM | POA: Diagnosis not present

## 2022-06-18 DIAGNOSIS — M720 Palmar fascial fibromatosis [Dupuytren]: Secondary | ICD-10-CM | POA: Diagnosis not present

## 2022-06-18 DIAGNOSIS — C61 Malignant neoplasm of prostate: Secondary | ICD-10-CM | POA: Diagnosis not present

## 2022-06-25 DIAGNOSIS — M720 Palmar fascial fibromatosis [Dupuytren]: Secondary | ICD-10-CM | POA: Diagnosis not present

## 2022-07-02 DIAGNOSIS — M720 Palmar fascial fibromatosis [Dupuytren]: Secondary | ICD-10-CM | POA: Diagnosis not present

## 2022-07-06 ENCOUNTER — Other Ambulatory Visit: Payer: Self-pay | Admitting: Family Medicine

## 2022-07-06 NOTE — Telephone Encounter (Signed)
Medication Refill - Medication: atorvastatin (LIPITOR) 30 MG tablet, ezetimibe (ZETIA) 10 MG tablet   Requesting a 90-day supply.  Has the patient contacted their pharmacy? No. (Agent: If no, request that the patient contact the pharmacy for the refill. If patient does not wish to contact the pharmacy document the reason why and proceed with request.)   Preferred Pharmacy (with phone number or street name):  [facility]) [place], [place] [address]  Phone: [phone] Fax: [phone]  Hours: Not open 24 hours   Has the patient been seen for an appointment in the last year OR does the patient have an upcoming appointment? No.  Agent: Please be advised that RX refills may take up to 3 business days. We ask that you follow-up with your pharmacy.

## 2022-07-07 DIAGNOSIS — M720 Palmar fascial fibromatosis [Dupuytren]: Secondary | ICD-10-CM | POA: Diagnosis not present

## 2022-07-07 MED ORDER — EZETIMIBE 10 MG PO TABS: 10.0000 mg | ORAL_TABLET | Freq: Every day | ORAL | 1 refills | Status: AC

## 2022-07-07 MED ORDER — ATORVASTATIN CALCIUM 80 MG PO TABS: 80.0000 mg | ORAL_TABLET | Freq: Every day | ORAL | 1 refills | Status: AC

## 2022-07-07 NOTE — Telephone Encounter (Signed)
Requested medications are due for refill today.  unsure  Requested medications are on the active medications list.  yes  Last refill. Statin 08/05/2020 #90 3 rf, zetia 08/06/2020 #90 3 rf  Future visit scheduled.   yes  Notes to clinic.  Rx signed by Shelva Majestic.    Requested Prescriptions  Pending Prescriptions Disp Refills   atorvastatin (LIPITOR) 80 MG tablet 90 tablet 3    Sig: Take 1 tablet (80 mg total) by mouth daily.     There is no refill protocol information for this order     ezetimibe (ZETIA) 10 MG tablet 90 tablet 3    Sig: Take 1 tablet (10 mg total) by mouth at bedtime.     There is no refill protocol information for this order

## 2022-07-24 DIAGNOSIS — M720 Palmar fascial fibromatosis [Dupuytren]: Secondary | ICD-10-CM | POA: Diagnosis not present

## 2022-08-18 DIAGNOSIS — M720 Palmar fascial fibromatosis [Dupuytren]: Secondary | ICD-10-CM | POA: Diagnosis not present

## 2022-09-01 DIAGNOSIS — M720 Palmar fascial fibromatosis [Dupuytren]: Secondary | ICD-10-CM | POA: Diagnosis not present

## 2022-09-08 DIAGNOSIS — M720 Palmar fascial fibromatosis [Dupuytren]: Secondary | ICD-10-CM | POA: Diagnosis not present

## 2022-09-16 ENCOUNTER — Ambulatory Visit (INDEPENDENT_AMBULATORY_CARE_PROVIDER_SITE_OTHER): Payer: Medicare Other | Admitting: Dermatology

## 2022-09-16 DIAGNOSIS — C44629 Squamous cell carcinoma of skin of left upper limb, including shoulder: Secondary | ICD-10-CM

## 2022-09-16 DIAGNOSIS — Z86007 Personal history of in-situ neoplasm of skin: Secondary | ICD-10-CM | POA: Diagnosis not present

## 2022-09-16 DIAGNOSIS — L570 Actinic keratosis: Secondary | ICD-10-CM | POA: Diagnosis not present

## 2022-09-16 DIAGNOSIS — D1801 Hemangioma of skin and subcutaneous tissue: Secondary | ICD-10-CM

## 2022-09-16 DIAGNOSIS — C4492 Squamous cell carcinoma of skin, unspecified: Secondary | ICD-10-CM

## 2022-09-16 DIAGNOSIS — D485 Neoplasm of uncertain behavior of skin: Secondary | ICD-10-CM

## 2022-09-16 DIAGNOSIS — L578 Other skin changes due to chronic exposure to nonionizing radiation: Secondary | ICD-10-CM

## 2022-09-16 DIAGNOSIS — Z85828 Personal history of other malignant neoplasm of skin: Secondary | ICD-10-CM | POA: Diagnosis not present

## 2022-09-16 DIAGNOSIS — L821 Other seborrheic keratosis: Secondary | ICD-10-CM | POA: Diagnosis not present

## 2022-09-16 HISTORY — DX: Squamous cell carcinoma of skin, unspecified: C44.92

## 2022-09-16 NOTE — Patient Instructions (Addendum)
Wound Care Instructions  Cleanse wound gently with soap and water once a day then pat dry with clean gauze. Apply a thin coat of Petrolatum (petroleum jelly, "Vaseline") over the wound (unless you have an allergy to this). We recommend that you use a new, sterile tube of Vaseline. Do not pick or remove scabs. Do not remove the yellow or white "healing tissue" from the base of the wound.  Cover the wound with fresh, clean, nonstick gauze and secure with paper tape. You may use Band-Aids in place of gauze and tape if the wound is small enough, but would recommend trimming much of the tape off as there is often too much. Sometimes Band-Aids can irritate the skin.  You should call the office for your biopsy report after 1 week if you have not already been contacted.  If you experience any problems, such as abnormal amounts of bleeding, swelling, significant bruising, significant pain, or evidence of infection, please call the office immediately.  FOR ADULT SURGERY PATIENTS: If you need something for pain relief you may take 1 extra strength Tylenol (acetaminophen) AND 2 Ibuprofen (200mg each) together every 4 hours as needed for pain. (do not take these if you are allergic to them or if you have a reason you should not take them.) Typically, you may only need pain medication for 1 to 3 days.    Seborrheic Keratosis  What causes seborrheic keratoses? Seborrheic keratoses are harmless, common skin growths that first appear during adult life.  As time goes by, more growths appear.  Some people may develop a large number of them.  Seborrheic keratoses appear on both covered and uncovered body parts.  They are not caused by sunlight.  The tendency to develop seborrheic keratoses can be inherited.  They vary in color from skin-colored to gray, brown, or even black.  They can be either smooth or have a rough, warty surface.   Seborrheic keratoses are superficial and look as if they were stuck on the skin.   Under the microscope this type of keratosis looks like layers upon layers of skin.  That is why at times the top layer may seem to fall off, but the rest of the growth remains and re-grows.    Treatment Seborrheic keratoses do not need to be treated, but can easily be removed in the office.  Seborrheic keratoses often cause symptoms when they rub on clothing or jewelry.  Lesions can be in the way of shaving.  If they become inflamed, they can cause itching, soreness, or burning.  Removal of a seborrheic keratosis can be accomplished by freezing, burning, or surgery. If any spot bleeds, scabs, or grows rapidly, please return to have it checked, as these can be an indication of a skin cancer.   Due to recent changes in healthcare laws, you may see results of your pathology and/or laboratory studies on MyChart before the doctors have had a chance to review them. We understand that in some cases there may be results that are confusing or concerning to you. Please understand that not all results are received at the same time and often the doctors may need to interpret multiple results in order to provide you with the best plan of care or course of treatment. Therefore, we ask that you please give us 2 business days to thoroughly review all your results before contacting the office for clarification. Should we see a critical lab result, you will be contacted sooner.   If You Need   Anything After Your Visit  If you have any questions or concerns for your doctor, please call our main line at [phone] and press option 4 to reach your doctor's medical assistant. If no one answers, please leave a voicemail as directed and we will return your call as soon as possible. Messages left after 4 pm will be answered the following business day.   You may also send us a message via MyChart. We typically respond to MyChart messages within 1-2 business days.  For prescription refills, please ask your pharmacy to contact  our office. Our fax number is [phone].  If you have an urgent issue when the clinic is closed that cannot wait until the next business day, you can page your doctor at the number below.    Please note that while we do our best to be available for urgent issues outside of office hours, we are not available 24/7.   If you have an urgent issue and are unable to reach us, you may choose to seek medical care at your doctor's office, retail clinic, urgent care center, or emergency room.  If you have a medical emergency, please immediately call 911 or go to the emergency department.  Pager Numbers  - Dr. Kowalski: [phone]  - Dr. Moye: [phone]  - Dr. Stewart: [phone]  In the event of inclement weather, please call our main line at [phone] for an update on the status of any delays or closures.  Dermatology Medication Tips: Please keep the boxes that topical medications come in in order to help keep track of the instructions about where and how to use these. Pharmacies typically print the medication instructions only on the boxes and not directly on the medication tubes.   If your medication is too expensive, please contact our office at [phone] option 4 or send us a message through MyChart.   We are unable to tell what your co-pay for medications will be in advance as this is different depending on your insurance coverage. However, we may be able to find a substitute medication at lower cost or fill out paperwork to get insurance to cover a needed medication.   If a prior authorization is required to get your medication covered by your insurance company, please allow us 1-2 business days to complete this process.  Drug prices often vary depending on where the prescription is filled and some pharmacies may offer cheaper prices.  The website www.goodrx.com contains coupons for medications through different pharmacies. The prices here do not account for what the cost  may be with help from insurance (it may be cheaper with your insurance), but the website can give you the price if you did not use any insurance.  - You can print the associated coupon and take it with your prescription to the pharmacy.  - You may also stop by our office during regular business hours and pick up a GoodRx coupon card.  - If you need your prescription sent electronically to a different pharmacy, notify our office through [facility] MyChart or by phone at [phone] option 4.     Si Usted Necesita Algo Despus de Su Visita  Tambin puede enviarnos un mensaje a travs de MyChart. Por lo general respondemos a los mensajes de MyChart en el transcurso de 1 a 2 das hbiles.  Para renovar recetas, por favor pida a su farmacia que se ponga en contacto con nuestra oficina. Nuestro nmero de fax es el [phone].  Si tiene un asunto   urgente cuando la clnica est cerrada y que no puede esperar hasta el siguiente da hbil, puede llamar/localizar a su doctor(a) al nmero que aparece a continuacin.   Por favor, tenga en cuenta que aunque hacemos todo lo posible para estar disponibles para asuntos urgentes fuera del horario de oficina, no estamos disponibles las 24 horas del da, los 7 das de la semana.   Si tiene un problema urgente y no puede comunicarse con nosotros, puede optar por buscar atencin mdica  en el consultorio de su doctor(a), en una clnica privada, en un centro de atencin urgente o en una sala de emergencias.  Si tiene una emergencia mdica, por favor llame inmediatamente al 911 o vaya a la sala de emergencias.  Nmeros de bper  - Dr. Kowalski: [phone]  - Dra. Moye: [phone]  - Dra. Stewart: [phone]  En caso de inclemencias del tiempo, por favor llame a nuestra lnea principal al [phone] para una actualizacin sobre el estado de cualquier retraso o cierre.  Consejos para la medicacin en dermatologa: Por favor, guarde las cajas en  las que vienen los medicamentos de uso tpico para ayudarle a seguir las instrucciones sobre dnde y cmo usarlos. Las farmacias generalmente imprimen las instrucciones del medicamento slo en las cajas y no directamente en los tubos del medicamento.   Si su medicamento es muy caro, por favor, pngase en contacto con nuestra oficina llamando al [phone] y presione la opcin 4 o envenos un mensaje a travs de MyChart.   No podemos decirle cul ser su copago por los medicamentos por adelantado ya que esto es diferente dependiendo de la cobertura de su seguro. Sin embargo, es posible que podamos encontrar un medicamento sustituto a menor costo o llenar un formulario para que el seguro cubra el medicamento que se considera necesario.   Si se requiere una autorizacin previa para que su compaa de seguros cubra su medicamento, por favor permtanos de 1 a 2 das hbiles para completar este proceso.  Los precios de los medicamentos varan con frecuencia dependiendo del lugar de dnde se surte la receta y alguna farmacias pueden ofrecer precios ms baratos.  El sitio web www.goodrx.com tiene cupones para medicamentos de diferentes farmacias. Los precios aqu no tienen en cuenta lo que podra costar con la ayuda del seguro (puede ser ms barato con su seguro), pero el sitio web puede darle el precio si no utiliz ningn seguro.  - Puede imprimir el cupn correspondiente y llevarlo con su receta a la farmacia.  - Tambin puede pasar por nuestra oficina durante el horario de atencin regular y recoger una tarjeta de cupones de GoodRx.  - Si necesita que su receta se enve electrnicamente a una farmacia diferente, informe a nuestra oficina a travs de MyChart de [facility] o por telfono llamando al [phone] y presione la opcin 4.  

## 2022-09-16 NOTE — Progress Notes (Signed)
Follow-Up Visit   Subjective  Elijah Peters is a 82 y.o. male who presents for the following: Spots of concern (Patient here to have a few spots looked at on his right shoulder, back, left forearm. Area on the left shoulder is itchy and the left forearm will ooze at times. ).   The following portions of the chart were reviewed this encounter and updated as appropriate:       Review of Systems:  No other skin or systemic complaints except as noted in HPI or Assessment and Plan.  Objective  Well appearing patient in no apparent distress; mood and affect are within normal limits.  A focused examination was performed including face, trunk, extremities. Relevant physical exam findings are noted in the Assessment and Plan.  left mid forearm 16m heme-crusted red papule   R med shoulder x 3, R spinal upper back x 1, L post shoulder x 1 (5) Pink scaly macules.    Assessment & Plan  Actinic Damage - chronic, secondary to cumulative UV radiation exposure/sun exposure over time - diffuse scaly erythematous macules with underlying dyspigmentation - Recommend daily broad spectrum sunscreen SPF 30+ to sun-exposed areas, reapply every 2 hours as needed.  - Recommend staying in the shade or wearing long sleeves, sun glasses (UVA+UVB protection) and wide brim hats (4-inch brim around the entire circumference of the hat). - Call for new or changing lesions.  History of Squamous Cell Carcinoma in Situ of the Skin - No evidence of recurrence today - Recommend regular full body skin exams - Recommend daily broad spectrum sunscreen SPF 30+ to sun-exposed areas, reapply every 2 hours as needed.  - Call if any new or changing lesions are noted between office visits  Seborrheic Keratoses - Stuck-on, waxy, tan-brown papules and/or plaques, including right shoulder  - Benign-appearing - Discussed benign etiology and prognosis. - Observe - Call for any changes - Discussed cryotherapy to right  shoulder since itchy, pt defers today.  Hemangiomas - Red papules - Discussed benign nature - Observe - Call for any changes  Neoplasm of uncertain behavior of skin left mid forearm  Epidermal / dermal shaving  Lesion diameter (cm):  0.7 Informed consent: discussed and consent obtained   Patient was prepped and draped in usual sterile fashion: Area prepped with alcohol. Anesthesia: the lesion was anesthetized in a standard fashion   Anesthetic:  1% lidocaine w/ epinephrine 1-100,000 buffered w/ 8.4% NaHCO3 Instrument used: flexible razor blade   Hemostasis achieved with: pressure, aluminum chloride and electrodesiccation   Outcome: patient tolerated procedure well   Post-procedure details: wound care instructions given   Post-procedure details comment:  Ointment and small bandage applied  Specimen 1 - Surgical pathology Differential Diagnosis: Inflamed SK vs Pyogenic Granuloma r/o BCC Check Margins: No  AK (actinic keratosis) (5) R med shoulder x 3, R spinal upper back x 1, L post shoulder x 1  Actinic keratoses are precancerous spots that appear secondary to cumulative UV radiation exposure/sun exposure over time. They are chronic with expected duration over 1 year. A portion of actinic keratoses will progress to squamous cell carcinoma of the skin. It is not possible to reliably predict which spots will progress to skin cancer and so treatment is recommended to prevent development of skin cancer.  Recommend daily broad spectrum sunscreen SPF 30+ to sun-exposed areas, reapply every 2 hours as needed.  Recommend staying in the shade or wearing long sleeves, sun glasses (UVA+UVB protection) and wide brim hats (  4-inch brim around the entire circumference of the hat). Call for new or changing lesions.  Destruction of lesion - R med shoulder x 3, R spinal upper back x 1, L post shoulder x 1  Destruction method: cryotherapy   Informed consent: discussed and consent obtained    Lesion destroyed using liquid nitrogen: Yes   Region frozen until ice ball extended beyond lesion: Yes   Outcome: patient tolerated procedure well with no complications   Post-procedure details: wound care instructions given   Additional details:  Prior to procedure, discussed risks of blister formation, small wound, skin dyspigmentation, or rare scar following cryotherapy. Recommend Vaseline ointment to treated areas while healing.    Return as scheduled with Dr. Dara Lords, Jamesetta Orleans, CMA, am acting as scribe for Brendolyn Patty, MD .  Documentation: I have reviewed the above documentation for accuracy and completeness, and I agree with the above.  Brendolyn Patty MD

## 2022-09-21 ENCOUNTER — Telehealth: Payer: Self-pay

## 2022-09-21 NOTE — Telephone Encounter (Signed)
Advised pt of bx results and scheduled pt for EDC vs EXC.  Will also do patients TBSE at appt or post op since pt was scheduled with Dr. Nehemiah Massed on the same day as appt with Dr. Vira Blanco

## 2022-09-21 NOTE — Telephone Encounter (Signed)
-----  Message from Brendolyn Patty, MD sent at 09/21/2022  3:44 PM EST ----- Skin , left mid forearm MODERATELY DIFFERENTIATED SQUAMOUS CELL CARCINOMA, ULCERATED  SCC skin cancer- needs further treatment, excision vrs EDC, schedule in surgery slot   - please call patient

## 2022-09-30 ENCOUNTER — Ambulatory Visit (INDEPENDENT_AMBULATORY_CARE_PROVIDER_SITE_OTHER): Payer: Medicare Other

## 2022-09-30 VITALS — BP 138/62 | Ht 70.5 in | Wt 186.7 lb

## 2022-09-30 DIAGNOSIS — Z0000 Encounter for general adult medical examination without abnormal findings: Secondary | ICD-10-CM

## 2022-09-30 NOTE — Progress Notes (Signed)
Subjective:   Elijah Peters is a 82 y.o. male who presents for Medicare Annual/Subsequent preventive examination.  Review of Systems     Cardiac Risk Factors include: advanced age (>88mn, >>72women);hypertension;male gender;dyslipidemia     Objective:    Today's Vitals   09/30/22 1022  BP: 138/62  Weight: 186 lb 11.2 oz (84.7 kg)  Height: 5' 10.5" (1.791 m)   Body mass index is 26.41 kg/m.     09/30/2022   10:55 AM 09/29/2021    9:13 AM 09/23/2020    9:13 AM 11/12/2019   12:24 PM 04/10/2019    8:52 AM 04/06/2018    9:21 AM 09/23/2016    6:54 AM  Advanced Directives  Does Patient Have a Medical Advance Directive? Yes Yes Yes Yes Yes Yes Yes  Type of AParamedicof [place]Living will Healthcare Power of ABel[place]Living will HLam[place]villeLiving will HHillcrest HeightsLiving will HLyerlyLiving will HMassacLiving will  Does patient want to make changes to medical advance directive?  Yes (Inpatient - patient defers changing a medical advance directive and declines information at this time)  No - Patient declined   No - Patient declined  Copy of HJeromesvillein Chart? Yes - validated most recent copy scanned in chart (See row information) Yes - validated most recent copy scanned in chart (See row information) Yes - validated most recent copy scanned in chart (See row information) No - copy requested Yes - validated most recent copy scanned in chart (See row information) Yes No - copy requested  Would patient like information on creating a medical advance directive?    No - Patient declined       Current Medications (verified) Outpatient Encounter Medications as of 09/30/2022  Medication Sig   Ascorbic Acid (VITAMIN C) 500 MG CAPS Take by mouth.   aspirin 81 MG tablet Take 81 mg by mouth daily.    atorvastatin (LIPITOR) 80 MG tablet Take 1 tablet  (80 mg total) by mouth daily.   B COMPLEX VITAMINS PO Take 1 tablet by mouth daily as needed (energy).    COD LIVER OIL PO Take by mouth. 2 tablets daily   cyclobenzaprine (FLEXERIL) 10 MG tablet Take 1 tablet (10 mg total) by mouth daily as needed for muscle spasms.   ezetimibe (ZETIA) 10 MG tablet Take 1 tablet (10 mg total) by mouth at bedtime.   HYDROcodone-acetaminophen (NORCO/VICODIN) 5-325 MG tablet Take 1 tablet by mouth every 6 (six) hours as needed for moderate pain.   icosapent Ethyl (VASCEPA) 1 g capsule Take 2 capsules (2 g total) by mouth 2 (two) times daily.   metoprolol succinate (TOPROL-XL) 50 MG 24 hr tablet Alternate taking 50 mg (1 tablet) daily and 25 mg (1/2 tablet) daily   Omega-3 Fatty Acids (FISH OIL PO) Take by mouth.   Potassium 99 MG TABS Take 1 tablet by mouth daily as needed (cramps).    ramipril (ALTACE) 10 MG capsule Take 1 capsule (10 mg total) by mouth 2 (two) times daily.   No facility-administered encounter medications on file as of 09/30/2022.    Allergies (verified) Niacin and related, Methocarbamol, and Niacin   History: Past Medical History:  Diagnosis Date   Actinic keratosis 01/12/2018   Right forehead. EDC   CAD (coronary artery disease)    Cancer (HCC)    LYMPHOMA/ TUMOR LEFT SINUS   Cataract  GERD (gastroesophageal reflux disease)    Heart murmur    History of nuclear stress test 06/2012   normal pattern of perfusion; low risk scan   Hyperlipidemia    Hypertension    LVH (left ventricular hypertrophy)    mild, concentric   Mild aortic sclerosis    Myocardial infarction ([place]) 08/1996   non-Q-wave inferolateral    Non Hodgkin's lymphoma ([place])    Peyronie's disease    Prostate cancer ([place])    Squamous cell carcinoma in situ 07/10/2018   L lat base of neck, lat edge    Squamous cell carcinoma of skin 09/16/2022   left mid forearm, needs EDC vs EXC   Past Surgical History:  Procedure Laterality Date   [place]   PTCA to LAD, in [place]  08/06/2010   right eye   CATARACT EXTRACTION W/PHACO Left 09/23/2016   Procedure: CATARACT EXTRACTION PHACO AND INTRAOCULAR LENS PLACEMENT ([address]);  Surgeon: Estill Cotta, MD;  Location: [facility] ORS;  Service: Ophthalmology;  Laterality: Left;  [place] 1:38.5 AP% 25.3 CDE 48.16 Fluid pack lot # [id number]   CORONARY ANGIOPLASTY WITH STENT PLACEMENT  1998   L circumflex - 3.0x23.9x9 bare metal stent   EYE SURGERY  2011   Cataracts removed.   HEMORROIDECTOMY     HERNIA REPAIR  2004   lymphoma removed     PROSTATE SURGERY  03/2009   seed implant due to prostate cancer   TONSILLECTOMY  childhood   TRANSTHORACIC ECHOCARDIOGRAM  09/01/2011   EF=>55%; mild conc LVH; borderline RV enlargement; LA mildly dilated; mild mitral annular calcif; mild-mod MR; RV systolic pressure elevated; AV mildly sclerotic; mild AV regurg; aortic root sclerosis/calcif   Family History  Problem Relation Age of Onset   Emphysema Father    COPD Father    Stroke Mother    Hypertension Brother    Hyperlipidemia Brother    Heart disease Brother        CAD   Social History   Socioeconomic History   Marital status: Married    Spouse name: Not on file   Number of children: 4   Years of education: Not on file   Highest education level: Bachelor's degree (e.g., BA, AB, BS)  Occupational History   Occupation: retired    Fish farm manager: [employer]  Tobacco Use   Smoking status: Former    Years: 9.00    Types: Cigarettes    Quit date: 08/25/1963    Years since quitting: 59.1   Smokeless tobacco: Never  Vaping Use   Vaping Use: Never used  Substance and Sexual Activity   Alcohol use: Yes    Alcohol/week: 2.0 - 4.0 standard drinks of alcohol    Types: 2 - 3 Glasses of wine per week    Comment: 1/2 glass 2-3xs a week   Drug use: No   Sexual activity: Not Currently  Other Topics Concern   Not on file  Social History Narrative   lives with wife   Social  Determinants of Health   Financial Resource Strain: Low Risk  (09/30/2022)   Overall Financial Resource Strain (CARDIA)    Difficulty of Paying Living Expenses: Not hard at all  Food Insecurity: No Food Insecurity (09/29/2021)   Hunger Vital Sign    Worried About Running Out of Food in the Last Year: Never true    Ran Out of Food in the Last Year: Never true  Transportation Needs: No Transportation Needs (09/30/2022)  PRAPARE - Hydrologist (Medical): No    Lack of Transportation (Non-Medical): No  Physical Activity: Insufficiently Active (09/30/2022)   Exercise Vital Sign    Days of Exercise per Week: 3 days    Minutes of Exercise per Session: 30 min  Stress: No Stress Concern Present (09/30/2022)   [place]    Feeling of Stress : Not at all  Social Connections: Moderately Isolated (09/30/2022)   Social Connection and Isolation Panel [NHANES]    Frequency of Communication with Friends and Family: More than three times a week    Frequency of Social Gatherings with Friends and Family: Twice a week    Attends Religious Services: Never    Marine scientist or Organizations: No    Attends Music therapist: Never    Marital Status: Married    Tobacco Counseling Counseling given: Not Answered   Clinical Intake:  Pre-visit preparation completed: Yes  Pain : No/denies pain     BMI - recorded: 26.41 Nutritional Status: BMI 25 -29 Overweight Nutritional Risks: Other (Comment) Diabetes: No  How often do you need to have someone help you when you read instructions, pamphlets, or other written materials from your doctor or pharmacy?: 1 - Never  Diabetic?NO  Interpreter Needed?: No  Information entered by :: B.Raushanah Osmundson,LPN   Activities of Daily Living    09/30/2022   10:55 AM  In your present state of health, do you have any difficulty performing the following activities:   Hearing? 0  Vision? 1  Comment cannot read fine print well  Difficulty concentrating or making decisions? 0  Walking or climbing stairs? 0  Dressing or bathing? 0  Doing errands, shopping? 0  Preparing Food and eating ? N  Using the Toilet? N  In the past six months, have you accidently leaked urine? N  Do you have problems with loss of bowel control? N  Managing your Medications? N  Managing your Finances? N  Housekeeping or managing your Housekeeping? N    Patient Care Team: Eulis Foster, MD as PCP - General (Family Medicine) Troy Sine, MD as PCP - Cardiology (Cardiology) Estill Cotta, MD as Consulting Physician (Ophthalmology) Myrlene Broker, MD as Attending Physician (Urology) Corey Harold, MD as Referring Physician (Internal Medicine) Ralene Bathe, MD (Dermatology)  Indicate any recent Medical Services you may have received from other than [facility] providers in the past year (date may be approximate).     Assessment:   This is a routine wellness examination for Sharad.  Hearing/Vision screen Hearing Screening - Comments:: Adequate hearing Vision Screening - Comments:: Adequate vision with glasses..DR. Dindledine..will make appt as cannot read fine print  Dietary issues and exercise activities discussed: Current Exercise Habits: Home exercise routine, Type of exercise: walking, Time (Minutes): 30, Frequency (Times/Week): 3, Weekly Exercise (Minutes/Week): 90, Intensity: Mild, Exercise limited by: None identified   Goals Addressed             This Visit's Progress    Activity and Exercise Increased   On track    Evidence-based guidance:  Review current exercise levels.  Assess patient perspective on exercise or activity level, barriers to increasing activity, motivation and readiness for change.  Recommend or set healthy exercise goal based on individual tolerance.  Encourage small steps toward making change in amount of exercise or  activity.  Urge reduction of sedentary activities or screen time.  Promote  group activities within the community or with family or support person.  Consider referral to rehabiliation therapist for assessment and exercise/activity plan.   Notes:      Increase water intake   Not on track    Recommend to drink at least 6-8 8oz glasses of water per day.       Depression Screen    09/30/2022   10:39 AM 05/21/2022    9:06 AM 09/29/2021    9:11 AM 09/23/2020    9:11 AM 04/10/2019    8:52 AM 04/06/2018    9:21 AM 10/13/2017    8:29 AM  PHQ 2/9 Scores  PHQ - 2 Score 0 0 0 0 0 0 0  PHQ- 9 Score  0         Fall Risk    09/30/2022   10:27 AM 05/21/2022    9:06 AM 09/29/2021    9:13 AM 09/23/2020    9:13 AM 07/17/2019    9:26 AM  Fall Risk   Falls in the past year? 0 0 0 0 0  Comment     Emmi Telephone Survey: data to providers prior to load  Number falls in past yr: 0 0 0 0   Injury with Fall? 0 0 0 0   Risk for fall due to : Other (Comment) No Fall Risks No Fall Risks    Follow up Education provided;Falls prevention discussed  Falls evaluation completed      FALL RISK PREVENTION PERTAINING TO THE HOME:  Any stairs in or around the home? No  If so, are there any without handrails? No  Home free of loose throw rugs in walkways, pet beds, electrical cords, etc? Yes  Adequate lighting in your home to reduce risk of falls? Yes   ASSISTIVE DEVICES UTILIZED TO PREVENT FALLS:  Life alert? No  Use of a cane, walker or w/c? No  Grab bars in the bathroom? No  Shower chair or bench in shower? Yes  Elevated toilet seat or a handicapped toilet? Yes   Cognitive Function:        09/30/2022   10:59 AM 04/10/2019    8:56 AM 09/17/2016   10:23 AM  6CIT Screen  What Year? 0 points 0 points 0 points  What month? 0 points 0 points 0 points  What time? 0 points 0 points 0 points  Count back from 20 0 points 0 points 0 points  Months in reverse 0 points 0 points 0 points  Repeat phrase 0 points 0  points 2 points  Total Score 0 points 0 points 2 points    Immunizations Immunization History  Administered Date(s) Administered   Fluad Quad(high Dose 65+) 05/23/2020, 05/21/2022   Influenza, High Dose Seasonal PF 04/14/2017, 04/14/2017, 06/25/2018, 06/25/2018, 06/16/2019   Influenza-Unspecified 05/24/2013, 05/30/2015, 08/06/2016   PFIZER Comirnaty(Gray Top)Covid-19 Tri-Sucrose Vaccine 09/29/2019, 10/24/2019, 06/20/2020   PFIZER(Purple Top)SARS-COV-2 Vaccination 09/29/2019, 10/24/2019, 06/20/2020   Pneumococcal Conjugate-13 09/20/2014   Pneumococcal Polysaccharide-23 07/01/2011   Tdap 12/27/2014   Zoster, Live 09/27/2012    TDAP status: Up to date  Flu Vaccine status: Up to date  Pneumococcal vaccine status: Up to date  Covid-19 vaccine status: Completed vaccines  Qualifies for Shingles Vaccine? Yes   Zostavax completed No   Shingrix Completed?: No.    Education has been provided regarding the importance of this vaccine. Patient has been advised to call insurance company to determine out of pocket expense if they have not yet received this vaccine. Advised may  also receive vaccine at local pharmacy or Health Dept. Verbalized acceptance and understanding.  Screening Tests Health Maintenance  Topic Date Due   Zoster Vaccines- Shingrix (1 of 2) Never done   COVID-19 Vaccine (7 - 2023-24 season) 04/24/2022   Medicare Annual Wellness (AWV)  10/01/2023   DTaP/Tdap/Td (2 - Td or Tdap) 12/26/2024   Pneumonia Vaccine 40+ Years old  Completed   INFLUENZA VACCINE  Completed   HPV VACCINES  Aged Out    Health Maintenance  Health Maintenance Due  Topic Date Due   Zoster Vaccines- Shingrix (1 of 2) Never done   COVID-19 Vaccine (7 - 2023-24 season) 04/24/2022    Colorectal cancer screening: No longer required.   Lung Cancer Screening: (Low Dose CT Chest recommended if Age 58-80 years, 30 pack-year currently smoking OR have quit w/in 15years.) does not qualify.   Lung Cancer  Screening Referral: no  Additional Screening:  Hepatitis C Screening: does not qualify; Completed no  Vision Screening: Recommended annual ophthalmology exams for early detection of glaucoma and other disorders of the eye. Is the patient up to date with their annual eye exam?  Yes  Who is the provider or what is the name of the office in which the patient attends annual eye exams? Dr DindleDime If pt is not established with a provider, would they like to be referred to a provider to establish care? No .   Dental Screening: Recommended annual dental exams for proper oral hygiene  Community Resource Referral / Chronic Care Management: CRR required this visit?  No   CCM required this visit?  No      Plan:     I have personally reviewed and noted the following in the patient's chart:   Medical and social history Use of alcohol, tobacco or illicit drugs  Current medications and supplements including opioid prescriptions. Patient is currently taking opioid prescriptions. Information provided to patient regarding non-opioid alternatives. Patient advised to discuss non-opioid treatment plan with their provider. Functional ability and status Nutritional status Physical activity Advanced directives List of other physicians Hospitalizations, surgeries, and ER visits in previous 12 months Vitals Screenings to include cognitive, depression, and falls Referrals and appointments  In addition, I have reviewed and discussed with patient certain preventive protocols, quality metrics, and best practice recommendations. A written personalized care plan for preventive services as well as general preventive health recommendations were provided to patient.     Roger Shelter, LPN   02/25/1699   Nurse Notes: pt doing well;has no concerns or questions

## 2022-10-05 ENCOUNTER — Telehealth: Payer: Self-pay

## 2022-10-05 NOTE — Telephone Encounter (Signed)
Copied from [place] [id number]. Topic: General - Other >> Oct 05, 2022 11:09 AM Everette C wrote: Reason for CRM: The patient would like to speak with B. Bordeaux when possible   Please contact further when available

## 2022-10-06 DIAGNOSIS — M720 Palmar fascial fibromatosis [Dupuytren]: Secondary | ICD-10-CM | POA: Diagnosis not present

## 2022-10-21 ENCOUNTER — Telehealth: Payer: Self-pay

## 2022-10-21 NOTE — Telephone Encounter (Signed)
Copied from [place] [id number]. Topic: General - Inquiry >> Oct 21, 2022  3:29 PM Marcellus Scott wrote: Reason for CRM: Pt is requesting a callback from [place]; Hassan Rowan LPN stated he has a question for her and its not urgent.  Please advise.

## 2022-10-26 ENCOUNTER — Encounter: Payer: Self-pay | Admitting: Dermatology

## 2022-10-26 ENCOUNTER — Ambulatory Visit (INDEPENDENT_AMBULATORY_CARE_PROVIDER_SITE_OTHER): Payer: Medicare Other | Admitting: Dermatology

## 2022-10-26 ENCOUNTER — Ambulatory Visit: Payer: Medicare Other | Admitting: Dermatology

## 2022-10-26 VITALS — BP 147/89 | HR 78

## 2022-10-26 DIAGNOSIS — L570 Actinic keratosis: Secondary | ICD-10-CM

## 2022-10-26 DIAGNOSIS — L820 Inflamed seborrheic keratosis: Secondary | ICD-10-CM

## 2022-10-26 DIAGNOSIS — L814 Other melanin hyperpigmentation: Secondary | ICD-10-CM

## 2022-10-26 DIAGNOSIS — Z86007 Personal history of in-situ neoplasm of skin: Secondary | ICD-10-CM | POA: Diagnosis not present

## 2022-10-26 DIAGNOSIS — L821 Other seborrheic keratosis: Secondary | ICD-10-CM | POA: Diagnosis not present

## 2022-10-26 DIAGNOSIS — L578 Other skin changes due to chronic exposure to nonionizing radiation: Secondary | ICD-10-CM | POA: Diagnosis not present

## 2022-10-26 DIAGNOSIS — C44629 Squamous cell carcinoma of skin of left upper limb, including shoulder: Secondary | ICD-10-CM | POA: Diagnosis not present

## 2022-10-26 DIAGNOSIS — C4492 Squamous cell carcinoma of skin, unspecified: Secondary | ICD-10-CM

## 2022-10-26 NOTE — Patient Instructions (Addendum)
Wound Care Instructions  Cleanse wound gently with soap and water once a day then pat dry with clean gauze. Apply a thin coat of Petrolatum (petroleum jelly, "Vaseline") over the wound (unless you have an allergy to this). We recommend that you use a new, sterile tube of Vaseline. Do not pick or remove scabs. Do not remove the yellow or white "healing tissue" from the base of the wound.  Cover the wound with fresh, clean, nonstick gauze and secure with paper tape. You may use Band-Aids in place of gauze and tape if the wound is small enough, but would recommend trimming much of the tape off as there is often too much. Sometimes Band-Aids can irritate the skin.  If you experience any problems, such as abnormal amounts of bleeding, swelling, significant bruising, significant pain, or evidence of infection, please call the office immediately.  FOR ADULT SURGERY PATIENTS: If you need something for pain relief you may take 1 extra strength Tylenol (acetaminophen) AND 2 Ibuprofen ('200mg'$  each) together every 4 hours as needed for pain. (do not take these if you are allergic to them or if you have a reason you should not take them.) Typically, you may only need pain medication for 1 to 3 days.     Due to recent changes in healthcare laws, you may see results of your pathology and/or laboratory studies on MyChart before the doctors have had a chance to review them. We understand that in some cases there may be results that are confusing or concerning to you. Please understand that not all results are received at the same time and often the doctors may need to interpret multiple results in order to provide you with the best plan of care or course of treatment. Therefore, we ask that you please give [place] 2 business days to thoroughly review all your results before contacting the office for clarification. Should we see a critical lab result, you will be contacted sooner.   If You Need Anything After Your  Visit  If you have any questions or concerns for your doctor, please call our main line at [phone] and press option 4 to reach your doctor's medical assistant. If no one answers, please leave a voicemail as directed and we will return your call as soon as possible. Messages left after 4 pm will be answered the following business day.   You may also send [place] a message via [place]. We typically respond to MyChart messages within 1-2 business days.  For prescription refills, please ask your pharmacy to contact our office. Our fax number is [phone].  If you have an urgent issue when the clinic is closed that cannot wait until the next business day, you can page your doctor at the number below.    Please note that while we do our best to be available for urgent issues outside of office hours, we are not available 24/7.   If you have an urgent issue and are unable to reach [place], you may choose to seek medical care at your doctor's office, retail clinic, urgent care center, or emergency room.  If you have a medical emergency, please immediately call 911 or go to the emergency department.  Pager Numbers  - Dr. Nehemiah Massed: [phone]  - Dr. Laurence Ferrari: [phone]  - Dr. Nicole Kindred: [phone]  In the event of inclement weather, please call our main line at [phone] for an update on the status of any delays or closures.  Dermatology Medication Tips: Please keep the boxes  that topical medications come in in order to help keep track of the instructions about where and how to use these. Pharmacies typically print the medication instructions only on the boxes and not directly on the medication tubes.   If your medication is too expensive, please contact our office at [phone] option 4 or send [place] a message through [place].   We are unable to tell what your co-pay for medications will be in advance as this is different depending on your insurance coverage. However, we may be able to find a  substitute medication at lower cost or fill out paperwork to get insurance to cover a needed medication.   If a prior authorization is required to get your medication covered by your insurance company, please allow [place] 1-2 business days to complete this process.  Drug prices often vary depending on where the prescription is filled and some pharmacies may offer cheaper prices.  The website www.goodrx.com contains coupons for medications through different pharmacies. The prices here do not account for what the cost may be with help from insurance (it may be cheaper with your insurance), but the website can give you the price if you did not use any insurance.  - You can print the associated coupon and take it with your prescription to the pharmacy.  - You may also stop by our office during regular business hours and pick up a GoodRx coupon card.  - If you need your prescription sent electronically to a different pharmacy, notify our office through [facility] or by phone at [phone] option 4.     Si Usted Necesita Algo Despus de Su Visita  Tambin puede enviarnos un mensaje a travs de Pharmacist, community. Por lo general respondemos a los mensajes de MyChart en el transcurso de 1 a 2 das hbiles.  Para renovar recetas, por favor pida a su farmacia que se ponga en contacto con nuestra oficina. Harland Dingwall de fax es [place] [phone].  Si tiene un asunto urgente cuando la clnica est cerrada y que no puede esperar hasta el siguiente da hbil, puede llamar/localizar a su doctor(a) al nmero que aparece a continuacin.   Por favor, tenga en cuenta que aunque hacemos todo lo posible para estar disponibles para asuntos urgentes fuera del horario de [place], no estamos disponibles las 24 horas del da, los 7 das de la [place].   Si tiene un problema urgente y no puede comunicarse con nosotros, puede optar por buscar atencin mdica  en el consultorio de su doctor(a), en una clnica privada, en un  centro de atencin urgente o en una sala de emergencias.  Si tiene Engineering geologist, por favor llame inmediatamente al 911 o vaya a la sala de emergencias.  Nmeros de bper  - Dr. Nehemiah Massed: [phone]  - Dra. Moye: [phone]  - Dra. Nicole Kindred: [phone]  En caso de inclemencias del [place], por favor llame a Johnsie Kindred principal al [phone] para una actualizacin sobre el [place] de cualquier retraso o cierre.  Consejos para la medicacin en dermatologa: Por favor, guarde las cajas en las que vienen los medicamentos de uso tpico para ayudarle a seguir las instrucciones sobre dnde y cmo usarlos. Las farmacias generalmente imprimen las instrucciones del medicamento slo en las cajas y no directamente en los tubos del [place].   Si su medicamento es muy caro, por favor, pngase en contacto con Zigmund Daniel llamando al [phone] y presione la opcin 4 o envenos un mensaje a travs de Pharmacist, community.  No podemos decirle cul ser su copago por los medicamentos por adelantado ya que esto es diferente dependiendo de la cobertura de su seguro. Sin embargo, es posible que podamos encontrar un medicamento sustituto a Electrical engineer un formulario para que el seguro cubra el medicamento que se considera necesario.   Si se requiere una autorizacin previa para que su compaa de seguros [place] su medicamento, por favor permtanos de 1 a 2 das hbiles para completar este proceso.  Los precios de los medicamentos varan con frecuencia dependiendo del Environmental consultant de dnde se surte la receta y alguna farmacias pueden ofrecer precios ms baratos.  El sitio web www.goodrx.com tiene cupones para medicamentos de Airline pilot. Los precios aqu no tienen en cuenta lo que podra costar con la ayuda del seguro (puede ser ms barato con su seguro), pero el sitio web puede darle el precio si no utiliz Research scientist (physical sciences).  - Puede imprimir el cupn correspondiente y llevarlo con su receta  a la farmacia.  - Tambin puede pasar por nuestra oficina durante el horario de atencin regular y Charity fundraiser una tarjeta de cupones de GoodRx.  - Si necesita que su receta se enve electrnicamente a una farmacia diferente, informe a nuestra oficina a travs de MyChart de [facility] o por telfono llamando al [phone] y presione la opcin 4.

## 2022-10-26 NOTE — Progress Notes (Signed)
Follow-Up Visit   Subjective  Elijah Peters is a 82 y.o. male who presents for the following: Follow-up.  Patient presents for follow-up biopsy of the left mid forearm, SCC. EDC vs EXC today. He has a history of Aks and SCC of the left lateral lower neck. He has an irritated spot on his left neck he would like checked/treated. Patient defers UBSE today.   The following portions of the chart were reviewed this encounter and updated as appropriate:       Review of Systems:  No other skin or systemic complaints except as noted in HPI or Assessment and Plan.  Objective  Well appearing patient in no apparent distress; mood and affect are within normal limits.  A focused examination was performed including face, scalp, arms, neck. Relevant physical exam findings are noted in the Assessment and Plan.  Left Mid Forearm Pink biopsy site.       L lateral neck Erythematous stuck-on, waxy papule  L antihelix x 1, L hand dorsum x 1, L forearm x 3, L forehead x 3, L temple x 1, R forehead x 2, R zygoma x 1, R elbow x 1, R forearm x 3, R hand dorsum x 2 (18) Pink scaly macules.    Assessment & Plan  Actinic Damage - chronic, secondary to cumulative UV radiation exposure/sun exposure over time - diffuse scaly erythematous macules with underlying dyspigmentation - Recommend daily broad spectrum sunscreen SPF 30+ to sun-exposed areas, reapply every 2 hours as needed.  - Recommend staying in the shade or wearing long sleeves, sun glasses (UVA+UVB protection) and wide brim hats (4-inch brim around the entire circumference of the hat). - Call for new or changing lesions.  Lentigines - Scattered tan macules - Due to sun exposure - Benign-appearing, observe - Recommend daily broad spectrum sunscreen SPF 30+ to sun-exposed areas, reapply every 2 hours as needed. - Call for any changes  Seborrheic Keratoses - Stuck-on, waxy, tan-brown papules and/or plaques  - Benign-appearing -  Discussed benign etiology and prognosis. - Observe - Call for any changes  History of Squamous Cell Carcinoma in Situ of the Skin - No evidence of recurrence today of the left lateral base of neck - Recommend regular full body skin exams - Recommend daily broad spectrum sunscreen SPF 30+ to sun-exposed areas, reapply every 2 hours as needed.  - Call if any new or changing lesions are noted between office visits  Squamous cell carcinoma of skin Left Mid Forearm  Destruction of lesion  Destruction method: electrodesiccation and curettage   Informed consent: discussed and consent obtained   Timeout:  patient name, date of birth, surgical site, and procedure verified Anesthesia: the lesion was anesthetized in a standard fashion   Anesthetic:  1% lidocaine w/ epinephrine 1-100,000 local infiltration Curettage performed in three different directions: Yes   Electrodesiccation performed over the curetted area: Yes   Final wound size (cm):  1.1 Hemostasis achieved with:  pressure, aluminum chloride and electrodesiccation Outcome: patient tolerated procedure well with no complications   Post-procedure details: wound care instructions given   Post-procedure details comment:  Ointment and bandage applied.  Biopsy proven. EDC today.  Inflamed seborrheic keratosis L lateral neck  Symptomatic, irritating, patient would like treated.  Destruction of lesion - L lateral neck  Destruction method: cryotherapy   Informed consent: discussed and consent obtained   Lesion destroyed using liquid nitrogen: Yes   Region frozen until ice ball extended beyond lesion: Yes  Outcome: patient tolerated procedure well with no complications   Post-procedure details: wound care instructions given   Additional details:  Prior to procedure, discussed risks of blister formation, small wound, skin dyspigmentation, or rare scar following cryotherapy. Recommend Vaseline ointment to treated areas while  healing.   AK (actinic keratosis) (18) L antihelix x 1, L hand dorsum x 1, L forearm x 3, L forehead x 3, L temple x 1, R forehead x 2, R zygoma x 1, R elbow x 1, R forearm x 3, R hand dorsum x 2  Actinic keratoses are precancerous spots that appear secondary to cumulative UV radiation exposure/sun exposure over time. They are chronic with expected duration over 1 year. A portion of actinic keratoses will progress to squamous cell carcinoma of the skin. It is not possible to reliably predict which spots will progress to skin cancer and so treatment is recommended to prevent development of skin cancer.  Recommend daily broad spectrum sunscreen SPF 30+ to sun-exposed areas, reapply every 2 hours as needed.  Recommend staying in the shade or wearing long sleeves, sun glasses (UVA+UVB protection) and wide brim hats (4-inch brim around the entire circumference of the hat). Call for new or changing lesions.  Destruction of lesion - L antihelix x 1, L hand dorsum x 1, L forearm x 3, L forehead x 3, L temple x 1, R forehead x 2, R zygoma x 1, R elbow x 1, R forearm x 3, R hand dorsum x 2  Destruction method: cryotherapy   Informed consent: discussed and consent obtained   Lesion destroyed using liquid nitrogen: Yes   Region frozen until ice ball extended beyond lesion: Yes   Outcome: patient tolerated procedure well with no complications   Post-procedure details: wound care instructions given   Additional details:  Prior to procedure, discussed risks of blister formation, small wound, skin dyspigmentation, or rare scar following cryotherapy. Recommend Vaseline ointment to treated areas while healing.    Return in about 6 months (around 04/28/2023) for Hx SCC, Hx AKs.  IJamesetta Orleans, CMA, am acting as scribe for Brendolyn Patty, MD .  Documentation: I have reviewed the above documentation for accuracy and completeness, and I agree with the above.  Brendolyn Patty MD

## 2022-11-17 DIAGNOSIS — M720 Palmar fascial fibromatosis [Dupuytren]: Secondary | ICD-10-CM | POA: Diagnosis not present

## 2022-12-08 DIAGNOSIS — H35371 Puckering of macula, right eye: Secondary | ICD-10-CM | POA: Diagnosis not present

## 2022-12-08 DIAGNOSIS — Z0100 Encounter for examination of eyes and vision without abnormal findings: Secondary | ICD-10-CM | POA: Diagnosis not present

## 2022-12-08 DIAGNOSIS — H16223 Keratoconjunctivitis sicca, not specified as Sjogren's, bilateral: Secondary | ICD-10-CM | POA: Diagnosis not present

## 2022-12-08 DIAGNOSIS — H524 Presbyopia: Secondary | ICD-10-CM | POA: Diagnosis not present

## 2023-02-17 DIAGNOSIS — K080 Exfoliation of teeth due to systemic causes: Secondary | ICD-10-CM | POA: Diagnosis not present

## 2023-03-24 DIAGNOSIS — C8191 Hodgkin lymphoma, unspecified, lymph nodes of head, face, and neck: Secondary | ICD-10-CM | POA: Diagnosis not present

## 2023-04-27 DIAGNOSIS — K080 Exfoliation of teeth due to systemic causes: Secondary | ICD-10-CM | POA: Diagnosis not present

## 2023-05-03 ENCOUNTER — Ambulatory Visit: Payer: Medicare Other | Admitting: Dermatology

## 2023-05-18 ENCOUNTER — Ambulatory Visit: Payer: Medicare Other | Admitting: Dermatology

## 2023-05-18 DIAGNOSIS — K080 Exfoliation of teeth due to systemic causes: Secondary | ICD-10-CM | POA: Diagnosis not present

## 2023-06-23 DIAGNOSIS — H35371 Puckering of macula, right eye: Secondary | ICD-10-CM | POA: Diagnosis not present

## 2023-06-23 DIAGNOSIS — H26493 Other secondary cataract, bilateral: Secondary | ICD-10-CM | POA: Diagnosis not present

## 2023-06-23 DIAGNOSIS — Z0100 Encounter for examination of eyes and vision without abnormal findings: Secondary | ICD-10-CM | POA: Diagnosis not present

## 2023-06-24 DIAGNOSIS — C61 Malignant neoplasm of prostate: Secondary | ICD-10-CM | POA: Diagnosis not present

## 2023-06-24 DIAGNOSIS — R350 Frequency of micturition: Secondary | ICD-10-CM | POA: Diagnosis not present

## 2023-09-06 ENCOUNTER — Ambulatory Visit: Payer: Medicare Other | Admitting: Dermatology

## 2023-09-06 DIAGNOSIS — L570 Actinic keratosis: Secondary | ICD-10-CM

## 2023-09-06 DIAGNOSIS — L821 Other seborrheic keratosis: Secondary | ICD-10-CM

## 2023-09-06 DIAGNOSIS — L300 Nummular dermatitis: Secondary | ICD-10-CM

## 2023-09-06 DIAGNOSIS — L578 Other skin changes due to chronic exposure to nonionizing radiation: Secondary | ICD-10-CM | POA: Diagnosis not present

## 2023-09-06 DIAGNOSIS — D1801 Hemangioma of skin and subcutaneous tissue: Secondary | ICD-10-CM

## 2023-09-06 DIAGNOSIS — W908XXA Exposure to other nonionizing radiation, initial encounter: Secondary | ICD-10-CM | POA: Diagnosis not present

## 2023-09-06 DIAGNOSIS — Z1283 Encounter for screening for malignant neoplasm of skin: Secondary | ICD-10-CM | POA: Diagnosis not present

## 2023-09-06 DIAGNOSIS — L814 Other melanin hyperpigmentation: Secondary | ICD-10-CM

## 2023-09-06 DIAGNOSIS — Z85828 Personal history of other malignant neoplasm of skin: Secondary | ICD-10-CM

## 2023-09-06 DIAGNOSIS — D229 Melanocytic nevi, unspecified: Secondary | ICD-10-CM

## 2023-09-06 MED ORDER — CLOBETASOL PROPIONATE 0.05 % EX SOLN: 1.0000 | Freq: Two times a day (BID) | CUTANEOUS | 0 refills | Status: DC

## 2023-09-06 NOTE — Progress Notes (Signed)
 Follow-Up Visit   Subjective  Elijah Peters is a 83 y.o. male who presents for the following: Skin Cancer Screening and Upper Body Skin Exam hx of SCC, Aks, rash back x 4 wks, used otc HC cream and it itching mostly resolved but rash still present.  The patient presents for Upper Body Skin Exam (UBSE) for skin cancer screening and mole check. The patient has spots, moles and lesions to be evaluated, some may be new or changing and the patient may have concern these could be cancer.    The following portions of the chart were reviewed this encounter and updated as appropriate: medications, allergies, medical history  Review of Systems:  No other skin or systemic complaints except as noted in HPI or Assessment and Plan.  Objective  Well appearing patient in no apparent distress; mood and affect are within normal limits.  All skin waist up examined. Relevant physical exam findings are noted in the Assessment and Plan.  L ear helix x 1, L upper forehead x 1, R forehead x 3, R medial cheek x 1, R ear x 2, L hand dorsum x 2, R hand dorsum x 2 (12) Pink scaly macules  Assessment & Plan   AK (ACTINIC KERATOSIS) (12) L ear helix x 1, L upper forehead x 1, R forehead x 3, R medial cheek x 1, R ear x 2, L hand dorsum x 2, R hand dorsum x 2 (12) Actinic keratoses are precancerous spots that appear secondary to cumulative UV radiation exposure/sun exposure over time. They are chronic with expected duration over 1 year. A portion of actinic keratoses will progress to squamous cell carcinoma of the skin. It is not possible to reliably predict which spots will progress to skin cancer and so treatment is recommended to prevent development of skin cancer.  Recommend daily broad spectrum sunscreen SPF 30+ to sun-exposed areas, reapply every 2 hours as needed.  Recommend staying in the shade or wearing long sleeves, sun glasses (UVA+UVB protection) and wide brim hats (4-inch brim around the entire  circumference of the hat). Call for new or changing lesions. Destruction of lesion - L ear helix x 1, L upper forehead x 1, R forehead x 3, R medial cheek x 1, R ear x 2, L hand dorsum x 2, R hand dorsum x 2 (12)  Destruction method: cryotherapy   Informed consent: discussed and consent obtained   Lesion destroyed using liquid nitrogen: Yes   Region frozen until ice ball extended beyond lesion: Yes   Outcome: patient tolerated procedure well with no complications   Post-procedure details: wound care instructions given   Additional details:  Prior to procedure, discussed risks of blister formation, small wound, skin dyspigmentation, or rare scar following cryotherapy. Recommend Vaseline ointment to treated areas while healing.  Skin cancer screening performed today.  Actinic Damage - Chronic condition, secondary to cumulative UV/sun exposure - diffuse scaly erythematous macules with underlying dyspigmentation - Recommend daily broad spectrum sunscreen SPF 30+ to sun-exposed areas, reapply every 2 hours as needed.  - Staying in the shade or wearing long sleeves, sun glasses (UVA+UVB protection) and wide brim hats (4-inch brim around the entire circumference of the hat) are also recommended for sun protection.  - Call for new or changing lesions.  Lentigines, Seborrheic Keratoses, Hemangiomas - Benign normal skin lesions - Benign-appearing - Call for any changes  Melanocytic Nevi - Tan-brown and/or pink-flesh-colored symmetric macules and papules - Benign appearing on exam today -  Observation - Call clinic for new or changing moles - Recommend daily use of broad spectrum spf 30+ sunscreen to sun-exposed areas.   HISTORY OF SQUAMOUS CELL CARCINOMA OF THE SKIN - No evidence of recurrence today - Recommend regular full body skin exams - Recommend daily broad spectrum sunscreen SPF 30+ to sun-exposed areas, reapply every 2 hours as needed.  - Call if any new or changing lesions are noted  between office visits - L mid forearm    Nummular Dermatitis Exam: Pink scaly paps back, chest, abdomen  Chronic and persistent condition with duration or expected duration over one year. Condition is bothersome/symptomatic for patient. Currently flared.   Nummular dermatitis (eczema) is a chronic, relapsing, itchy rash that can significantly affect quality of life. It is often associated with dry skin and flares in the wintertime, and may require treatment with prescription topical anti-inflammatory medications, in addition to gentle skin care.  If there is associated atopic dermatitis and topicals are not working, then biologic injections may be necessary to clear rash and control symptoms.  Treatment Plan: D/c [employer] bath soap Start Dove for Sensitive skin soap Start Clobetasol Orlie cr twice daily aa rash back, chest, abdomen for up to 4 weeks, avoid f/g/a Start Cerave cream qd as moisturizer once no longer using medicated mixture Gentle skin care handout provided.   RTC if not improved in 4 weeks  Topical steroids (such as triamcinolone, fluocinolone, fluocinonide, mometasone, clobetasol , halobetasol, betamethasone, hydrocortisone ) can cause thinning and lightening of the skin if they are used for too long in the same area. Your physician has selected the right strength medicine for your problem and area affected on the body. Please use your medication only as directed by your physician to prevent side effects.    Recommend mild soap and moisturizing cream 1-2 times daily.  Gentle skin care handout provided.      Return in about 1 year (around 09/05/2024) for TBSE, Hx of SCC, Hx of AKs.  I, Grayce Saunas, RMA, am acting as scribe for Rexene Rattler, MD .   Documentation: I have reviewed the above documentation for accuracy and completeness, and I agree with the above.  Rexene Rattler, MD

## 2023-09-06 NOTE — Patient Instructions (Addendum)
 Eczema Skin Care back, chest, abdomen  Buy TWO 16oz jars of CeraVe moisturizing cream  [facility], [facility], [facility] (no prescription needed)  Costs about $15 per jar   Jar #1: Use as a moisturizer as needed. Can be applied to any area of the body. Use twice daily to unaffected areas.  Jar #2: Pour one 50ml bottle of clobetasol  0.05% solution into jar, mix well. Label this jar to indicate the medication has been added. Use twice daily to affected areas for up to 4 weeks. Do not apply to face, groin or underarms.  Moisturizer may burn or sting initially. Try for at least 4 weeks.    Cryotherapy Aftercare  Wash gently with soap and water everyday.   Apply Vaseline and Band-Aid daily until healed.    Gentle Skin Care Guide  1. Bathe no more than once a day.  2. Avoid bathing in hot water  3. Use a mild soap like Dove, Vanicream, Cetaphil, CeraVe. Can use Lever 2000 or Cetaphil antibacterial soap  4. Use soap only where you need it. On most days, use it under your arms, between your legs, and on your feet. Let the water rinse other areas unless visibly dirty.  5. When you get out of the bath/shower, use a towel to gently blot your skin dry, don't rub it.  6. While your skin is still a little damp, apply a moisturizing cream such as Vanicream, CeraVe, Cetaphil, Eucerin, Sarna lotion or plain Vaseline Jelly. For hands apply Neutrogena Norwegian Hand Cream or Excipial Hand Cream.  7. Reapply moisturizer any time you start to itch or feel dry.  8. Sometimes using free and clear laundry detergents can be helpful. Fabric softener sheets should be avoided. Downy Free & Gentle liquid, or any liquid fabric softener that is free of dyes and perfumes, it acceptable to use  9. If your doctor has given you prescription creams you may apply moisturizers over them     Due to recent changes in healthcare laws, you may see results of your pathology and/or laboratory studies on MyChart before the doctors  have had a chance to review them. We understand that in some cases there may be results that are confusing or concerning to you. Please understand that not all results are received at the same time and often the doctors may need to interpret multiple results in order to provide you with the best plan of care or course of treatment. Therefore, we ask that you please give us  2 business days to thoroughly review all your results before contacting the office for clarification. Should we see a critical lab result, you will be contacted sooner.   If You Need Anything After Your Visit  If you have any questions or concerns for your doctor, please call our main line at [phone] and press option 4 to reach your doctor's medical assistant. If no one answers, please leave a voicemail as directed and we will return your call as soon as possible. Messages left after 4 pm will be answered the following business day.   You may also send us  a message via MyChart. We typically respond to MyChart messages within 1-2 business days.  For prescription refills, please ask your pharmacy to contact our office. Our fax number is [phone].  If you have an urgent issue when the clinic is closed that cannot wait until the next business day, you can page your doctor at the number below.    Please note that while we  do our best to be available for urgent issues outside of office hours, we are not available 24/7.   If you have an urgent issue and are unable to reach us , you may choose to seek medical care at your doctor's office, retail clinic, urgent care center, or emergency room.  If you have a medical emergency, please immediately call 911 or go to the emergency department.  Pager Numbers  - Dr. Hester: [phone]  - Dr. Jackquline: [phone]  - Dr. Claudene: [phone]   In the event of inclement weather, please call our main line at [phone] for an update on the status of any delays or  closures.  Dermatology Medication Tips: Please keep the boxes that topical medications come in in order to help keep track of the instructions about where and how to use these. Pharmacies typically print the medication instructions only on the boxes and not directly on the medication tubes.   If your medication is too expensive, please contact our office at [phone] option 4 or send us  a message through MyChart.   We are unable to tell what your co-pay for medications will be in advance as this is different depending on your insurance coverage. However, we may be able to find a substitute medication at lower cost or fill out paperwork to get insurance to cover a needed medication.   If a prior authorization is required to get your medication covered by your insurance company, please allow us  1-2 business days to complete this process.  Drug prices often vary depending on where the prescription is filled and some pharmacies may offer cheaper prices.  The website www.goodrx.com contains coupons for medications through different pharmacies. The prices here do not account for what the cost may be with help from insurance (it may be cheaper with your insurance), but the website can give you the price if you did not use any insurance.  - You can print the associated coupon and take it with your prescription to the pharmacy.  - You may also stop by our office during regular business hours and pick up a GoodRx coupon card.  - If you need your prescription sent electronically to a different pharmacy, notify our office through [facility] or by phone at [phone] option 4.     Si Usted Necesita Algo Despus de Su Visita  Tambin puede enviarnos un mensaje a travs de Clinical Cytogeneticist. Por lo general respondemos a los mensajes de MyChart en el transcurso de 1 a 2 das hbiles.  Para renovar recetas, por favor pida a su farmacia que se ponga en contacto con nuestra oficina. Randi lakes de fax  es [place] [phone].  Si tiene un asunto urgente cuando la clnica est cerrada y que no puede esperar hasta el siguiente da hbil, puede llamar/localizar a su doctor(a) al nmero que aparece a continuacin.   Por favor, tenga en cuenta que aunque hacemos todo lo posible para estar disponibles para asuntos urgentes fuera del horario de [place], no estamos disponibles las 24 horas del da, los 7 [address]  [address] de la [place].   Si tiene un problema urgente y no puede comunicarse con nosotros, puede optar por buscar atencin mdica  en el consultorio de su doctor(a), en una clnica privada, en un centro de atencin urgente o en una sala de emergencias.  Si tiene engineer, drilling, por favor llame inmediatamente al 911 o vaya a la sala de emergencias.  Nmeros de bper  - Dr. Hester: [phone]  - Dra.  Jackquline: [phone]  - Dr. Claudene: [phone]   En caso de inclemencias del tiempo, por favor llame a landry capes principal al [phone] para una actualizacin sobre el [place] de cualquier retraso o cierre.  Consejos para la medicacin en dermatologa: Por favor, guarde las cajas en las que vienen los medicamentos de uso tpico para ayudarle a seguir las instrucciones sobre dnde y cmo usarlos. Las farmacias generalmente imprimen las instrucciones del medicamento slo en las cajas y no directamente en los tubos del [place].   Si su medicamento es muy caro, por favor, pngase en contacto con landry rieger llamando al [phone] y presione la opcin 4 o envenos un mensaje a travs de Clinical Cytogeneticist.   No podemos decirle cul ser su copago por los medicamentos por adelantado ya que esto es diferente dependiendo de la cobertura de su seguro. Sin embargo, es posible que podamos encontrar un medicamento sustituto a audiological scientist un formulario para que el seguro cubra el medicamento que se considera necesario.   Si se requiere una autorizacin previa para que su compaa de seguros  cubra su medicamento, por favor permtanos de 1 a 2 das hbiles para completar este proceso.  Los precios de los medicamentos varan con frecuencia dependiendo del environmental consultant de dnde se surte la receta y alguna farmacias pueden ofrecer precios ms baratos.  El sitio web www.goodrx.com tiene cupones para medicamentos de health and safety inspector. Los precios aqu no tienen en cuenta lo que podra costar con la ayuda del seguro (puede ser ms barato con su seguro), pero el sitio web puede darle el precio si no utiliz tourist information centre manager.  - Puede imprimir el cupn correspondiente y llevarlo con su receta a la farmacia.  - Tambin puede pasar por nuestra oficina durante el horario de atencin regular y education officer, museum una tarjeta de cupones de GoodRx.  - Si necesita que su receta se enve electrnicamente a una farmacia diferente, informe a nuestra oficina a travs de MyChart de [place] o por telfono llamando al [phone] y presione la opcin 4.

## 2023-09-08 DIAGNOSIS — K080 Exfoliation of teeth due to systemic causes: Secondary | ICD-10-CM | POA: Diagnosis not present

## 2023-10-05 ENCOUNTER — Ambulatory Visit: Payer: Medicare Other

## 2023-10-05 VITALS — BP 138/80 | Ht 70.0 in | Wt 186.4 lb

## 2023-10-05 DIAGNOSIS — Z0000 Encounter for general adult medical examination without abnormal findings: Secondary | ICD-10-CM

## 2023-10-05 NOTE — Progress Notes (Addendum)
Subjective:   Elijah Peters is a 83 y.o. male who presents for Medicare Annual/Subsequent preventive examination.  Visit Complete: In person  Cardiac Risk Factors include: advanced age (>5men, >11 women);hypertension;dyslipidemia;male gender;sedentary lifestyle     Objective:    Today's Vitals   10/05/23 1032  BP: 138/80  Weight: 186 lb 6.4 oz (84.6 kg)  Height: 5\' 10"  (1.778 m)   Body mass index is 26.75 kg/m.     10/05/2023   10:59 AM 09/30/2022   10:55 AM 09/29/2021    9:13 AM 09/23/2020    9:13 AM 11/12/2019   12:24 PM 04/10/2019    8:52 AM 04/06/2018    9:21 AM  Advanced Directives  Does Patient Have a Medical Advance Directive? Yes Yes Yes Yes Yes Yes Yes  Type of Estate agent of [place];Living will Healthcare Power of [place];Living will Healthcare Power of [employer] of [place];Living will Healthcare Power of [place];Living will Healthcare Power of [place];Living will Healthcare Power of [place];Living will  Does patient want to make changes to medical advance directive? No - Patient declined  Yes (Inpatient - patient defers changing a medical advance directive and declines information at this time)  No - Patient declined    Copy of Healthcare Power of Attorney in Chart? Yes - validated most recent copy scanned in chart (See row information) Yes - validated most recent copy scanned in chart (See row information) Yes - validated most recent copy scanned in chart (See row information) Yes - validated most recent copy scanned in chart (See row information) No - copy requested Yes - validated most recent copy scanned in chart (See row information) Yes  Would patient like information on creating a medical advance directive?     No - Patient declined      Current Medications (verified) Outpatient Encounter Medications as of 10/05/2023  Medication Sig   Ascorbic Acid (VITAMIN C) 500 MG CAPS Take by mouth.   aspirin 81 MG tablet Take  81 mg by mouth daily.    icosapent Ethyl (VASCEPA) 1 g capsule Take 2 capsules (2 g total) by mouth 2 (two) times daily.   metoprolol succinate (TOPROL-XL) 50 MG 24 hr tablet Alternate taking 50 mg (1 tablet) daily and 25 mg (1/2 tablet) daily   ramipril (ALTACE) 10 MG capsule Take 1 capsule (10 mg total) by mouth 2 (two) times daily.   atorvastatin (LIPITOR) 80 MG tablet Take 1 tablet (80 mg total) by mouth daily.   B COMPLEX VITAMINS PO Take 1 tablet by mouth daily as needed (energy).  (Patient not taking: Reported on 10/05/2023)   clobetasol (TEMOVATE) 0.05 % external solution Apply 1 Application topically 2 (two) times daily. Mix bottle in 1 tub of cerave cream as directed, bid aa itchy rash on back, chest and abdomen for up to 4 weeks, avoid face, groin, axilla (Patient not taking: Reported on 10/05/2023)   COD LIVER OIL PO Take by mouth. 2 tablets daily (Patient not taking: Reported on 10/05/2023)   cyclobenzaprine (FLEXERIL) 10 MG tablet Take 1 tablet (10 mg total) by mouth daily as needed for muscle spasms. (Patient not taking: Reported on 10/05/2023)   ezetimibe (ZETIA) 10 MG tablet Take 1 tablet (10 mg total) by mouth at bedtime. (Patient not taking: Reported on 10/05/2023)   HYDROcodone-acetaminophen (NORCO/VICODIN) 5-325 MG tablet Take 1 tablet by mouth every 6 (six) hours as needed for moderate pain. (Patient not taking: Reported on 10/05/2023)   Omega-3 Fatty Acids (  FISH OIL PO) Take by mouth. (Patient not taking: Reported on 10/05/2023)   Potassium 99 MG TABS Take 1 tablet by mouth daily as needed (cramps).  (Patient not taking: Reported on 10/05/2023)   No facility-administered encounter medications on file as of 10/05/2023.    Allergies (verified) Niacinamide, Niacin and related, Methocarbamol, and Niacin   History: Past Medical History:  Diagnosis Date   Actinic keratosis 01/12/2018   Right forehead. EDC   CAD (coronary artery disease)    Cancer (HCC)    LYMPHOMA/ TUMOR LEFT SINUS    Cataract    GERD (gastroesophageal reflux disease)    Heart murmur    History of nuclear stress test 06/2012   normal pattern of perfusion; low risk scan   Hyperlipidemia    Hypertension    LVH (left ventricular hypertrophy)    mild, concentric   Mild aortic sclerosis    Myocardial infarction (HCC) 08/1996   non-Q-wave inferolateral    Non Hodgkin's lymphoma (HCC)    Peyronie's disease    Prostate cancer (HCC)    Squamous cell carcinoma in situ 07/10/2018   L lat base of neck, lat edge    Squamous cell carcinoma of skin 09/16/2022   left mid forearm, needs [facility] 10/26/2022   Past Surgical History:  Procedure Laterality Date   CARDIAC CATHETERIZATION  1992   PTCA to LAD, in [place]   CATARACT EXTRACTION  08/06/2010   right eye   CATARACT EXTRACTION W/PHACO Left 09/23/2016   Procedure: CATARACT EXTRACTION PHACO AND INTRAOCULAR LENS PLACEMENT (IOC);  Surgeon: Sallee Lange, MD;  Location: [facility] ORS;  Service: Ophthalmology;  Laterality: Left;  [place] 1:38.5 AP% 25.3 CDE 48.16 Fluid pack lot # [id number]   CORONARY ANGIOPLASTY WITH STENT PLACEMENT  1998   L circumflex - 3.0x23.9x9 bare metal stent   EYE SURGERY  2011   Cataracts removed.   HEMORROIDECTOMY     HERNIA REPAIR  2004   lymphoma removed     PROSTATE SURGERY  03/2009   seed implant due to prostate cancer   TONSILLECTOMY  childhood   TRANSTHORACIC ECHOCARDIOGRAM  09/01/2011   EF=>55%; mild conc LVH; borderline RV enlargement; LA mildly dilated; mild mitral annular calcif; mild-mod MR; RV systolic pressure elevated; AV mildly sclerotic; mild AV regurg; aortic root sclerosis/calcif   Family History  Problem Relation Age of Onset   Emphysema Father    COPD Father    Stroke Mother    Hypertension Brother    Hyperlipidemia Brother    Heart disease Brother        CAD   Social History   Socioeconomic History   Marital status: Married    Spouse name: Not on file   Number of children: 4   Years of education: Not on  file   Highest education level: Bachelor's degree (e.g., BA, AB, BS)  Occupational History   Occupation: retired    Associate Professor: [employer]  Tobacco Use   Smoking status: Former    Current packs/day: 0.00    Types: Cigarettes    Start date: 08/24/1954    Quit date: 08/25/1963    Years since quitting: 60.1   Smokeless tobacco: Never  Vaping Use   Vaping status: Never Used  Substance and Sexual Activity   Alcohol use: Yes    Alcohol/week: 2.0 - 4.0 standard drinks of alcohol    Types: 2 - 3 Glasses of wine per week    Comment: 1/2 glass 2-3xs a week   Drug use:  No   Sexual activity: Not Currently  Other Topics Concern   Not on file  Social History Narrative   lives with wife   Social Drivers of Health   Financial Resource Strain: Low Risk  (10/05/2023)   Overall Financial Resource Strain (CARDIA)    Difficulty of Paying Living Expenses: Not hard at all  Food Insecurity: No Food Insecurity (10/05/2023)   Hunger Vital Sign    Worried About Running Out of Food in the Last Year: Never true    Ran Out of Food in the Last Year: Never true  Transportation Needs: No Transportation Needs (10/05/2023)   PRAPARE - Administrator, Civil Service (Medical): No    Lack of Transportation (Non-Medical): No  Physical Activity: Inactive (10/05/2023)   Exercise Vital Sign    Days of Exercise per Week: 0 days    Minutes of Exercise per Session: 0 min  Stress: No Stress Concern Present (10/05/2023)   [employer] of Occupational Health - Occupational Stress Questionnaire    Feeling of Stress : Not at all  Social Connections: Socially Integrated (10/05/2023)   Social Connection and Isolation Panel [NHANES]    Frequency of Communication with Friends and Family: More than three times a week    Frequency of Social Gatherings with Friends and Family: Once a week    Attends Religious Services: More than 4 times per year    Active Member of [employer] or Organizations: Yes    Attends Museum/gallery exhibitions officer: More than 4 times per year    Marital Status: Married    Tobacco Counseling Counseling given: Not Answered   Clinical Intake:  Pre-visit preparation completed: Yes  Pain : No/denies pain     BMI - recorded: 26.75 Nutritional Status: BMI 25 -29 Overweight Nutritional Risks: None Diabetes: No  How often do you need to have someone help you when you read instructions, pamphlets, or other written materials from your doctor or pharmacy?: 1 - Never  Interpreter Needed?: No  Information entered by :: Kennedy Bucker, LPN   Activities of Daily Living    10/05/2023   11:02 AM  In your present state of health, do you have any difficulty performing the following activities:  Hearing? 0  Vision? 1  Comment HAVING SOME DOUBLE VISION  Difficulty concentrating or making decisions? 0  Walking or climbing stairs? 0  Comment HAS TO HOLD ON TO RAILING  Dressing or bathing? 0  Doing errands, shopping? 0  Preparing Food and eating ? N  Using the Toilet? N  In the past six months, have you accidently leaked urine? N  Do you have problems with loss of bowel control? N  Managing your Medications? N  Managing your Finances? N  Housekeeping or managing your Housekeeping? N    Patient Care Team: Ronnald Ramp, MD as PCP - General (Family Medicine) Lennette Bihari, MD as PCP - Cardiology (Cardiology) Sallee Lange, MD as Consulting Physician (Ophthalmology) Esaw Dace, MD as Attending Physician (Urology) Renae Fickle, MD as Referring Physician (Internal Medicine) Deirdre Evener, MD (Dermatology)  Indicate any recent Medical Services you may have received from other than [facility] providers in the past year (date may be approximate).     Assessment:   This is a routine wellness examination for Juvon.  Hearing/Vision screen Hearing Screening - Comments:: NO AIDS Vision Screening - Comments:: WEARS GLASS ALL THE TIME- DR.DINGELDEIN    Goals Addressed  This Visit's Progress    DIET - EAT MORE FRUITS AND VEGETABLES         Depression Screen    10/05/2023   10:55 AM 09/30/2022   10:39 AM 05/21/2022    9:06 AM 09/29/2021    9:11 AM 09/23/2020    9:11 AM 04/10/2019    8:52 AM 04/06/2018    9:21 AM  PHQ 2/9 Scores  PHQ - 2 Score 0 0 0 0 0 0 0  PHQ- 9 Score 0  0        Fall Risk    10/05/2023   11:01 AM 09/30/2022   10:27 AM 05/21/2022    9:06 AM 09/29/2021    9:13 AM 09/23/2020    9:13 AM  Fall Risk   Falls in the past year? 1 0 0 0 0  Number falls in past yr: 0 0 0 0 0  Injury with Fall? 0 0 0 0 0  Risk for fall due to : History of fall(s) Other (Comment) No Fall Risks No Fall Risks   Follow up Falls prevention discussed;Falls evaluation completed Education provided;Falls prevention discussed  Falls evaluation completed     MEDICARE RISK AT HOME: Medicare Risk at Home Any stairs in or around the home?: Yes If so, are there any without handrails?: No Home free of loose throw rugs in walkways, pet beds, electrical cords, etc?: Yes Adequate lighting in your home to reduce risk of falls?: Yes Life alert?: No Use of a cane, walker or w/c?: No Grab bars in the bathroom?: No Shower chair or bench in shower?: Yes Elevated toilet seat or a handicapped toilet?: Yes  TIMED UP AND GO:  Was the test performed?  Yes  Length of time to ambulate 10 feet: 4 sec Gait steady and fast without use of assistive device    Cognitive Function:        10/05/2023   11:06 AM 09/30/2022   10:59 AM 04/10/2019    8:56 AM 09/17/2016   10:23 AM  6CIT Screen  What Year? 0 points 0 points 0 points 0 points  What month? 0 points 0 points 0 points 0 points  What time? 0 points 0 points 0 points 0 points  Count back from 20 0 points 0 points 0 points 0 points  Months in reverse 0 points 0 points 0 points 0 points  Repeat phrase 0 points 0 points 0 points 2 points  Total Score 0 points 0 points 0 points 2 points     Immunizations Immunization History  Administered Date(s) Administered   Fluad Quad(high Dose 65+) 05/23/2020, 05/21/2022   Influenza Split 05/25/2023   Influenza, High Dose Seasonal PF 04/14/2017, 04/14/2017, 06/25/2018, 06/25/2018, 06/16/2019   Influenza-Unspecified 05/24/2013, 05/30/2015, 08/06/2016   PFIZER Comirnaty(Gray Top)Covid-19 Tri-Sucrose Vaccine 09/29/2019, 10/24/2019, 06/20/2020   PFIZER(Purple Top)SARS-COV-2 Vaccination 09/29/2019, 10/24/2019, 06/20/2020   Pneumococcal Conjugate-13 09/20/2014   Pneumococcal Polysaccharide-23 07/01/2011   Tdap 12/27/2014   Zoster, Live 09/27/2012    TDAP status: Up to date  Flu Vaccine status: Up to date  Pneumococcal vaccine status: Declined,  Education has been provided regarding the importance of this vaccine but patient still declined. Advised may receive this vaccine at local pharmacy or Health Dept. Aware to provide a copy of the vaccination record if obtained from local pharmacy or Health Dept. Verbalized acceptance and understanding.   Covid-19 vaccine status: Completed vaccines  Qualifies for Shingles Vaccine? Yes   Zostavax completed Yes   Shingrix Completed?: Yes  Screening Tests Health Maintenance  Topic Date Due   Zoster Vaccines- Shingrix (1 of 2) 04/13/1960   COVID-19 Vaccine (7 - 2024-25 season) 04/25/2023   Medicare Annual Wellness (AWV)  10/04/2024   DTaP/Tdap/Td (2 - Td or Tdap) 12/26/2024   Pneumonia Vaccine 31+ Years old  Completed   INFLUENZA VACCINE  Completed   HPV VACCINES  Aged Out  STATES HAD BOTH [facility] AT [facility]  Health Maintenance  Health Maintenance Due  Topic Date Due   Zoster Vaccines- Shingrix (1 of 2) 04/13/1960   COVID-19 Vaccine (7 - 2024-25 season) 04/25/2023    Colorectal cancer screening: No longer required.   Lung Cancer Screening: (Low Dose CT Chest recommended if Age 61-80 years, 20 pack-year currently smoking OR have quit w/in 15years.) does not qualify.     Additional Screening:  Hepatitis C Screening: does not qualify; Completed NO  Vision Screening: Recommended annual ophthalmology exams for early detection of glaucoma and other disorders of the eye. Is the patient up to date with their annual eye exam?  Yes  Who is the provider or what is the name of the office in which the patient attends annual eye exams? DR.DINGELDEIN If pt is not established with a provider, would they like to be referred to a provider to establish care? No .   Dental Screening: Recommended annual dental exams for proper oral hygiene   Community Resource Referral / Chronic Care Management: CRR required this visit?  No   CCM required this visit?  No     Plan:     I have personally reviewed and noted the following in the patient's chart:   Medical and social history Use of alcohol, tobacco or illicit drugs  Current medications and supplements including opioid prescriptions. Patient is not currently taking opioid prescriptions. Functional ability and status Nutritional status Physical activity Advanced directives List of other physicians Hospitalizations, surgeries, and ER visits in previous 12 months Vitals Screenings to include cognitive, depression, and falls Referrals and appointments  In addition, I have reviewed and discussed with patient certain preventive protocols, quality metrics, and best practice recommendations. A written personalized care plan for preventive services as well as general preventive health recommendations were provided to patient.     Hal Hope, LPN   11/30/8117   After Visit Summary: (In Person-Declined) Patient declined AVS at this time.  Nurse Notes: NONE

## 2023-10-05 NOTE — Patient Instructions (Addendum)
Mr. Elijah Peters , Thank you for taking time to come for your Medicare Wellness Visit. I appreciate your ongoing commitment to your health goals. Please review the following plan we discussed and let me know if I can assist you in the future.   Referrals/Orders/Follow-Ups/Clinician Recommendations: NONE  This is a list of the screening recommended for you and due dates:  Health Maintenance  Topic Date Due   Zoster (Shingles) Vaccine (1 of 2) 04/13/1960   COVID-19 Vaccine (7 - 2024-25 season) 04/25/2023   Medicare Annual Wellness Visit  10/04/2024   DTaP/Tdap/Td vaccine (2 - Td or Tdap) 12/26/2024   Pneumonia Vaccine  Completed   Flu Shot  Completed   HPV Vaccine  Aged Out    Advanced directives: (In Chart) A copy of your advanced directives are scanned into your chart should your provider ever need it.  Next Medicare Annual Wellness Visit scheduled for next year: Yes   10/10/24 @ 1:10 PM IN PERSON

## 2023-12-06 ENCOUNTER — Encounter: Payer: Self-pay | Admitting: Family Medicine

## 2023-12-06 ENCOUNTER — Ambulatory Visit (INDEPENDENT_AMBULATORY_CARE_PROVIDER_SITE_OTHER): Payer: Medicare Other | Admitting: Family Medicine

## 2023-12-06 VITALS — BP 130/79 | HR 87 | Ht 70.0 in | Wt 187.0 lb

## 2023-12-06 DIAGNOSIS — Z0000 Encounter for general adult medical examination without abnormal findings: Secondary | ICD-10-CM | POA: Diagnosis not present

## 2023-12-06 DIAGNOSIS — I2510 Atherosclerotic heart disease of native coronary artery without angina pectoris: Secondary | ICD-10-CM

## 2023-12-06 DIAGNOSIS — Z130 Encounter for screening for diseases of the blood and blood-forming organs and certain disorders involving the immune mechanism: Secondary | ICD-10-CM

## 2023-12-06 DIAGNOSIS — M62830 Muscle spasm of back: Secondary | ICD-10-CM | POA: Diagnosis not present

## 2023-12-06 DIAGNOSIS — I10 Essential (primary) hypertension: Secondary | ICD-10-CM | POA: Diagnosis not present

## 2023-12-06 DIAGNOSIS — J3089 Other allergic rhinitis: Secondary | ICD-10-CM | POA: Diagnosis not present

## 2023-12-06 DIAGNOSIS — E7800 Pure hypercholesterolemia, unspecified: Secondary | ICD-10-CM

## 2023-12-06 DIAGNOSIS — K219 Gastro-esophageal reflux disease without esophagitis: Secondary | ICD-10-CM

## 2023-12-06 DIAGNOSIS — E785 Hyperlipidemia, unspecified: Secondary | ICD-10-CM

## 2023-12-06 MED ORDER — FLUTICASONE PROPIONATE 50 MCG/ACT NA SUSP: 2.0000 | Freq: Every day | NASAL | 6 refills | Status: AC

## 2023-12-06 MED ORDER — TIZANIDINE HCL 4 MG PO TABS: 4.0000 mg | ORAL_TABLET | Freq: Four times a day (QID) | ORAL | 0 refills | Status: AC | PRN

## 2023-12-06 MED ORDER — HYDROCODONE-ACETAMINOPHEN 5-325 MG PO TABS: 1.0000 | ORAL_TABLET | Freq: Four times a day (QID) | ORAL | 0 refills | Status: AC | PRN

## 2023-12-06 NOTE — Assessment & Plan Note (Signed)
 Chronic conditions are stable  Patient was counseled on benefits of regular physical activity with goal of 150 minutes of moderate to vigurous intensity 4 days per week  Patient was counseled to consume well balanced diet of fruits, vegetables, limited saturated fats and limited sugary foods and beverages with emphasis on consuming 6-8 glasses of water daily  Screening recommended today: lipids,BMP,CBC  Vaccines recommended today: COVID,Shingrix

## 2023-12-06 NOTE — Progress Notes (Signed)
 Complete physical exam   Patient: Elijah Peters   DOB: 06-20-1941   83 y.o. Male  MRN: [id number] Visit Date: 12/06/2023  Today's healthcare provider: Ronnald Ramp, MD   Chief Complaint  Patient presents with   Annual Exam    Discuss back spasms, about 15 years ago he started having these spasms, he was taking muscle relaxer, and a pain pill. Things got better then last Monday he backed into a door jam and the spasms has returned, he started using the old Rx and its helped, he wants to know should be continue taking them meds when he has a flare if so a new Rx needs to be written    Subjective    Elijah Peters is a 83 y.o. male who presents today for a complete physical exam.   He reports consuming a general diet.   The patient does not participate in regular exercise at present.    He does have additional problems to discuss today.   Discussed the use of AI scribe software for clinical note transcription with the patient, who gave verbal consent to proceed.  History of Present Illness Elijah Peters is an 83 year old male with hypertension, coronary artery disease, and hypercholesterolemia who presents for an annual physical exam and to discuss back spasms.  He has experienced back spasms for the past 15 years, initially managed with a muscle relaxant and pain medication. The spasms had improved until last Monday when he backed into a door, causing them to return. He has been using an old prescription of hydrocodone and cyclobenzaprine, which initially alleviated the pain, but the spasms have since returned. The spasms are now located in the lumbar region, different from the previous location between the shoulder blades. No sharp or shooting pain with leg movements.  He reports sinus issues at night, with congestion causing difficulty breathing. He uses a nasal flush to manage these symptoms, which provides relief. He does not use nasal sprays or other  medications for this issue.  He has a history of hypertension, currently managed with metoprolol 50 mg daily and Altace 10 mg daily. His blood pressure is well-controlled. He also has coronary artery disease and hypercholesterolemia, managed with atorvastatin 80 mg daily.  He has a past medical history of prostate cancer treated with radioactive pellets and has not had a PSA test recently. His urologist, Dr. Earlene Plater, prefers to conduct the PSA tests in his own lab.  He follows a standard diet and reports decreased physical activity since the death of his dog in 2020/01/10, which has led to less walking. He mentions having a gym membership but not utilizing it.     Past Medical History:  Diagnosis Date   Actinic keratosis 01/12/2018   Right forehead. ED[facility]   CAD (coronary artery disease)    Cancer (HCC)    LYMPHOMA/ TUMOR LEFT SINUS   Cataract    GERD (gastroesophageal reflux disease)    Heart murmur    History of nuclear stress test 06/2012   normal pattern of perfusion; low risk scan   Hyperlipidemia    Hypertension    LVH (left ventricular hypertrophy)    mild, concentric   Mild aortic sclerosis    Myocardial infarction (HCC) 08/1996   non-Q-wave inferolateral    Non Hodgkin's lymphoma (HCC)    Peyronie's disease    Prostate cancer (HCC)    Squamous cell carcinoma in situ 07/10/2018   L lat base of neck,  lat edge    Squamous cell carcinoma of skin 09/16/2022   left mid forearm, needs [facility] 10/26/2022   Past Surgical History:  Procedure Laterality Date   CARDIAC CATHETERIZATION  1992   PTCA to LAD, in [place]    CATARACT EXTRACTION  08/06/2010   right eye   CATARACT EXTRACTION W/PHACO Left 09/23/2016   Procedure: CATARACT EXTRACTION PHACO AND INTRAOCULAR LENS PLACEMENT (IOC);  Surgeon: Steven Dingeldein, MD;  Location: [facility] ORS;  Service: Ophthalmology;  Laterality: Left;  US  1:38.5 AP% 25.3 CDE 48.16 Fluid pack lot # [id number]   CORONARY ANGIOPLASTY WITH STENT PLACEMENT  1998   L  circumflex - 3.0x23.9x9 bare metal stent   EYE SURGERY  2011   Cataracts removed.   HEMORROIDECTOMY     HERNIA REPAIR  2004   lymphoma removed     PROSTATE SURGERY  03/2009   seed implant due to prostate cancer   TONSILLECTOMY  childhood   TRANSTHORACIC ECHOCARDIOGRAM  09/01/2011   EF=>55%; mild conc LVH; borderline RV enlargement; LA mildly dilated; mild mitral annular calcif; mild-mod MR; RV systolic pressure elevated; AV mildly sclerotic; mild AV regurg; aortic root sclerosis/calcif   Social History   Socioeconomic History   Marital status: Married    Spouse name: Not on file   Number of children: 4   Years of education: Not on file   Highest education level: Bachelor's degree (e.g., BA, AB, BS)  Occupational History   Occupation: retired    Associate Professor: [employer]  Tobacco Use   Smoking status: Former    Current packs/day: 0.00    Types: Cigarettes    Start date: 08/24/1954    Quit date: 08/25/1963    Years since quitting: 60.3   Smokeless tobacco: Never  Vaping Use   Vaping status: Never Used  Substance and Sexual Activity   Alcohol use: Yes    Alcohol/week: 2.0 - 4.0 standard drinks of alcohol    Types: 2 - 3 Glasses of wine per week    Comment: 1/2 glass 2-3xs a week   Drug use: No   Sexual activity: Not Currently  Other Topics Concern   Not on file  Social History Narrative   lives with wife   Social Drivers of Health   Financial Resource Strain: Low Risk  (10/05/2023)   Overall Financial Resource Strain (CARDIA)    Difficulty of Paying Living Expenses: Not hard at all  Food Insecurity: No Food Insecurity (10/05/2023)   Hunger Vital Sign    Worried About Running Out of Food in the Last Year: Never true    Ran Out of Food in the Last Year: Never true  Transportation Needs: No Transportation Needs (10/05/2023)   PRAPARE - Administrator, Civil Service (Medical): No    Lack of Transportation (Non-Medical): No  Physical Activity: Inactive (10/05/2023)    Exercise Vital Sign    Days of Exercise per Week: 0 days    Minutes of Exercise per Session: 0 min  Stress: No Stress Concern Present (10/05/2023)   [employer] of Occupational Health - Occupational Stress Questionnaire    Feeling of Stress : Not at all  Social Connections: Socially Integrated (10/05/2023)   Social Connection and Isolation Panel [NHANES]    Frequency of Communication with Friends and Family: More than three times a week    Frequency of Social Gatherings with Friends and Family: Once a week    Attends Religious Services: More than 4 times per year  Active Member of Clubs or Organizations: Yes    Attends Banker Meetings: More than 4 times per year    Marital Status: Married  Catering manager Violence: Not At Risk (10/05/2023)   Humiliation, Afraid, Rape, and Kick questionnaire    Fear of Current or Ex-Partner: No    Emotionally Abused: No    Physically Abused: No    Sexually Abused: No   Family Status  Relation Name Status   Father  Deceased at age 37       emphysema/COPD   Mother  Deceased at age 46       cva; overdose when pt was 4   Brother  Alive  No partnership data on file   Family History  Problem Relation Age of Onset   Emphysema Father    COPD Father    Stroke Mother    Hypertension Brother    Hyperlipidemia Brother    Heart disease Brother        CAD   Allergies  Allergen Reactions   Niacinamide     Other Reaction(s): flushing   Niacin And Related Itching   Methocarbamol Anxiety and Other (See Comments)    Mental status cjhanges   Niacin Itching     Medications: Outpatient Medications Prior to Visit  Medication Sig   Ascorbic Acid (VITAMIN C) 500 MG CAPS Take by mouth.   aspirin 81 MG tablet Take 81 mg by mouth daily.    atorvastatin (LIPITOR) 80 MG tablet Take 1 tablet (80 mg total) by mouth daily.   B COMPLEX VITAMINS PO Take 1 tablet by mouth daily as needed (energy).   clobetasol (TEMOVATE) 0.05 % external  solution Apply 1 Application topically 2 (two) times daily. Mix bottle in 1 tub of cerave cream as directed, bid aa itchy rash on back, chest and abdomen for up to 4 weeks, avoid face, groin, axilla   COD LIVER OIL PO Take by mouth. 2 tablets daily   ezetimibe (ZETIA) 10 MG tablet Take 1 tablet (10 mg total) by mouth at bedtime.   icosapent Ethyl (VASCEPA) 1 g capsule Take 2 capsules (2 g total) by mouth 2 (two) times daily.   metoprolol succinate (TOPROL-XL) 50 MG 24 hr tablet Alternate taking 50 mg (1 tablet) daily and 25 mg (1/2 tablet) daily   Omega-3 Fatty Acids (FISH OIL PO) Take by mouth.   Potassium 99 MG TABS Take 1 tablet by mouth daily as needed (cramps).   ramipril (ALTACE) 10 MG capsule Take 1 capsule (10 mg total) by mouth 2 (two) times daily.   [DISCONTINUED] cyclobenzaprine (FLEXERIL) 10 MG tablet Take 1 tablet (10 mg total) by mouth daily as needed for muscle spasms.   [DISCONTINUED] HYDROcodone-acetaminophen (NORCO/VICODIN) 5-325 MG tablet Take 1 tablet by mouth every 6 (six) hours as needed for moderate pain.   No facility-administered medications prior to visit.    Review of Systems  Last CBC Lab Results  Component Value Date   WBC 5.4 10/07/2021   HGB 14.2 10/07/2021   HCT 40.8 10/07/2021   MCV 94 10/07/2021   MCH 32.6 10/07/2021   RDW 11.6 10/07/2021   PLT 173 10/07/2021   Last metabolic panel Lab Results  Component Value Date   GLUCOSE 96 05/21/2022   NA 140 05/21/2022   K 4.9 05/21/2022   CL 103 05/21/2022   CO2 25 05/21/2022   BUN 17 05/21/2022   CREATININE 0.95 05/21/2022   EGFR 79 05/21/2022   CALCIUM 9.5  05/21/2022   PROT 6.4 10/07/2021   ALBUMIN 4.3 10/07/2021   LABGLOB 2.1 10/07/2021   AGRATIO 2.0 10/07/2021   BILITOT 0.8 10/07/2021   ALKPHOS 96 10/07/2021   AST 22 10/07/2021   ALT 17 10/07/2021   ANIONGAP 12 11/12/2019   Last lipids Lab Results  Component Value Date   CHOL 88 (L) 10/07/2021   HDL 29 (L) 10/07/2021   LDLCALC 44  10/07/2021   TRIG 70 10/07/2021   CHOLHDL 3.0 10/07/2021   .asvd  Last hemoglobin A1c No results found for: "HGBA1C" Last thyroid functions Lab Results  Component Value Date   TSH 2.110 07/26/2020       Objective    BP 130/79   Pulse 87   Ht 5\' 10"  (1.778 m)   Wt 187 lb (84.8 kg)   SpO2 100%   BMI 26.83 kg/m  BP Readings from Last 3 Encounters:  12/06/23 130/79  10/05/23 138/80  10/26/22 (!) 147/89   Wt Readings from Last 3 Encounters:  12/06/23 187 lb (84.8 kg)  10/05/23 186 lb 6.4 oz (84.6 kg)  09/30/22 186 lb 11.2 oz (84.7 kg)        Physical Exam  Physical Exam VITALS: BP- 130/79 CHEST: Lungs clear to auscultation, no wheezing. CARDIOVASCULAR: Heart murmur detected. MUSCULOSKELETAL: Paraspinal muscles tight, spasming. NEUROLOGICAL: Motor strength 5/5 in all extremities.    Last depression screening scores    10/05/2023   10:55 AM 09/30/2022   10:39 AM 05/21/2022    9:06 AM  PHQ 2/9 Scores  PHQ - 2 Score 0 0 0  PHQ- 9 Score 0  0    Last fall risk screening    10/05/2023   11:01 AM  Fall Risk   Falls in the past year? 1  Number falls in past yr: 0  Injury with Fall? 0  Risk for fall due to : History of fall(s)  Follow up Falls prevention discussed;Falls evaluation completed    Last Audit-C alcohol use screening    10/05/2023   10:54 AM  Alcohol Use Disorder Test (AUDIT)  1. How often do you have a drink containing alcohol? 2  2. How many drinks containing alcohol do you have on a typical day when you are drinking? 0  3. How often do you have six or more drinks on one occasion? 0  AUDIT-C Score 2   A score of 3 or more in women, and 4 or more in men indicates increased risk for alcohol abuse, EXCEPT if all of the points are from question 1   No results found for any visits on 12/06/23.  Assessment & Plan    Routine Health Maintenance and Physical Exam  Immunization History  Administered Date(s) Administered   Fluad Quad(high Dose  65+) 05/23/2020, 05/21/2022   Influenza Split 05/25/2023   Influenza, High Dose Seasonal PF 04/14/2017, 04/14/2017, 06/25/2018, 06/25/2018, 06/16/2019   Influenza-Unspecified 05/24/2013, 05/30/2015, 08/06/2016   PFIZER Comirnaty(Gray Top)Covid-19 Tri-Sucrose Vaccine 09/29/2019, 10/24/2019, 06/20/2020   PFIZER(Purple Top)SARS-COV-2 Vaccination 09/29/2019, 10/24/2019, 06/20/2020   Pneumococcal Conjugate-13 09/20/2014   Pneumococcal Polysaccharide-23 07/01/2011   Tdap 12/27/2014   Zoster, Live 09/27/2012    Health Maintenance  Topic Date Due   Zoster Vaccines- Shingrix (1 of 2) 04/13/1960   COVID-19 Vaccine (7 - 2024-25 season) 04/25/2023   INFLUENZA VACCINE  03/24/2024   Medicare Annual Wellness (AWV)  10/04/2024   DTaP/Tdap/Td (2 - Td or Tdap) 12/26/2024   Pneumonia Vaccine 65+ Years old  Completed   HPV  VACCINES  Aged Out   Meningococcal B Vaccine  Aged Out    Problem List Items Addressed This Visit       Cardiovascular and Mediastinum   Essential (primary) hypertension   Relevant Orders   BMP8+EGFR   CAD in native artery     Digestive   Acid reflux   Relevant Orders   CBC     Other   Hyperlipidemia with target LDL less than 70   Relevant Orders   Lipid panel   Hypercholesterolemia without hypertriglyceridemia   Relevant Orders   Lipid panel   Back spasm   Relevant Medications   tiZANidine (ZANAFLEX) 4 MG tablet   HYDROcodone-acetaminophen (NORCO/VICODIN) 5-325 MG tablet   Other Relevant Orders   Ambulatory referral to Physical Therapy   Annual physical exam - Primary   Chronic conditions are stable  Patient was counseled on benefits of regular physical activity with goal of 150 minutes of moderate to vigurous intensity 4 days per week  Patient was counseled to consume well balanced diet of fruits, vegetables, limited saturated fats and limited sugary foods and beverages with emphasis on consuming 6-8 glasses of water daily  Screening recommended today:  lipids,BMP,CBC  Vaccines recommended today: COVID,Shingrix        Other Visit Diagnoses       Screening for deficiency anemia       Relevant Orders   CBC     Non-seasonal allergic rhinitis due to other allergic trigger       Relevant Medications   fluticasone (FLONASE) 50 MCG/ACT nasal spray        Assessment & Plan Back spasms Chronic lumbar back spasms for 15 years, recently exacerbated by physical trauma. Previous treatment with hydrocodone and cyclobenzaprine was effective but caused excessive sedation. Spasms are more painful than tightness. - Prescribe tizanidine 4 mg every 6 hours as needed for muscle spasms. - Prescribe hydrocodone-acetaminophen 5-325 mg every 6 hours as needed for pain. - Refer to [facility] at [facility]. - Follow up in 4-6 weeks to assess improvement.  Sinus congestion Chronic sinus congestion, particularly at night, likely exacerbated by dry air and potential allergens. Nasal flush provides temporary relief. - Recommend using a humidifier at night. - Prescribe Flonase nasal spray, 2 sprays in each nostril daily. - Consider Astepro nasal spray for quicker relief if needed.  Hypertension Chronic hypertension, well-controlled with metoprolol and Altace. Current blood pressure is 130/79 mmHg. - Continue metoprolol 50 mg daily. - Continue Altace 10 mg daily.  Hyperlipidemia Chronic hyperlipidemia, managed with atorvastatin. - Continue atorvastatin 80 mg daily.  Prostate cancer Prostate cancer treated with radioactive pellets. PSA testing deferred to urologist's lab for consistency. - Defer PSA testing to the urology  General Health Maintenance Annual physical examination conducted. - Order basic metabolic panel, lipid panel, and CBC.   Return in about 6 weeks (around 01/17/2024) for back and sinuses.       Mimi Alt, MD  [facility] [phone] (phone) [phone] (fax)  [facility]

## 2023-12-06 NOTE — Patient Instructions (Signed)
 It was a pleasure to see you today!  Thank you for choosing [facility] for your primary care.   Today you were seen for your annual physical  Please review the attached information regarding helpful preventive health topics.   To keep you healthy, please keep in mind the following health maintenance items that you are due for:   1.Shingrix  2.COVID booster   Best Wishes,   Dr. Verdia Glad

## 2023-12-13 NOTE — Therapy (Signed)
 OUTPATIENT PHYSICAL THERAPY EVALUATION   Patient Name: Elijah Peters MRN: [id number] DOB:[birth year], 83 y.o., male Today's Date: 12/13/2023  END OF SESSION:   Past Medical History:  Diagnosis Date   Actinic keratosis 01/12/2018   Right forehead. EDC   CAD (coronary artery disease)    Cancer (HCC)    LYMPHOMA/ TUMOR LEFT SINUS   Cataract    GERD (gastroesophageal reflux disease)    Heart murmur    History of nuclear stress test 06/2012   normal pattern of perfusion; low risk scan   Hyperlipidemia    Hypertension    LVH (left ventricular hypertrophy)    mild, concentric   Mild aortic sclerosis    Myocardial infarction (HCC) 08/1996   non-Q-wave inferolateral    Non Hodgkin's lymphoma (HCC)    Peyronie's disease    Prostate cancer (HCC)    Squamous cell carcinoma in situ 07/10/2018   L lat base of neck, lat edge    Squamous cell carcinoma of skin 09/16/2022   left mid forearm, needs [facility] 10/26/2022   Past Surgical History:  Procedure Laterality Date   CARDIAC CATHETERIZATION  1992   PTCA to LAD, in [place]    CATARACT EXTRACTION  08/06/2010   right eye   CATARACT EXTRACTION W/PHACO Left 09/23/2016   Procedure: CATARACT EXTRACTION PHACO AND INTRAOCULAR LENS PLACEMENT (IOC);  Surgeon: Steven Dingeldein, MD;  Location: [facility] ORS;  Service: Ophthalmology;  Laterality: Left;  US  1:38.5 AP% 25.3 CDE 48.16 Fluid pack lot # [id number]   CORONARY ANGIOPLASTY WITH STENT PLACEMENT  1998   L circumflex - 3.0x23.9x9 bare metal stent   EYE SURGERY  2011   Cataracts removed.   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   History of coronary artery disease 03/26/2016   CAD in native artery 03/01/2015   Benign enlargement of prostate 03/01/2015   Narrowing of intervertebral disc space 03/01/2015   Contracture of palmar fascia (Dupuytren's) 03/01/2015   Essential (primary) hypertension 03/01/2015   Acid reflux 03/01/2015   H/O acute myocardial infarction 03/01/2015   Hypercholesterolemia without hypertriglyceridemia 03/01/2015   Prostate CA (HCC) 05/24/2014   CAD (coronary artery disease) 04/29/2013   HTN (hypertension) 04/29/2013   Hyperlipidemia with target LDL less than 70 04/29/2013   Aortic valve sclerosis 04/29/2013   Malignant neoplasm of prostate (HCC) 03/28/2012    PCP: Mimi Alt, MD  REFERRING PROVIDER: Mimi Alt, MD  REFERRING DIAG: back spasm  Rationale for Evaluation and Treatment: Rehabilitation  THERAPY DIAG:  No diagnosis found.  ONSET DATE: ***  SUBJECTIVE:  SUBJECTIVE STATEMENT: ***  PERTINENT HISTORY:  Patient is a 83 y.o. male who presents to outpatient physical therapy with a referral for medical diagnosis back spasm. This patient's chief complaints consist of ***, leading to the following functional deficits: ***. Relevant past medical history and comorbidities include the following: he has CAD (coronary artery disease); HTN (hypertension); Hyperlipidemia with target LDL less than 70; Aortic valve sclerosis; Prostate CA (HCC); CAD in native artery; Benign enlargement of prostate; Narrowing of intervertebral disc space; Contracture of palmar  fascia (Dupuytren's); Essential (primary) hypertension; Acid reflux; H/O acute myocardial infarction; Malignant neoplasm of prostate (HCC); Hypercholesterolemia without hypertriglyceridemia; Divergence insufficiency; History of coronary artery disease; Papilledema; and Back spasm on their problem list. he  has a past medical history of Actinic keratosis (01/12/2018), CAD (coronary artery disease), Cancer (HCC), Cataract, GERD (gastroesophageal reflux disease), Heart murmur, History of nuclear stress test (06/2012), Hyperlipidemia, Hypertension, LVH (left ventricular hypertrophy), Mild aortic sclerosis, Myocardial infarction (HCC) (08/1996), Non Hodgkin's lymphoma (HCC), Peyronie's disease, Prostate cancer (HCC), Squamous cell carcinoma in situ (07/10/2018), and Squamous cell carcinoma of skin (09/16/2022). he  has a past surgical history that includes Cardiac catheterization (1992); Coronary angioplasty with stent (1998); transthoracic echocardiogram (09/01/2011); Eye surgery (2011); Prostate surgery (03/2009); Hernia repair (2004); Tonsillectomy (childhood); Hemorroidectomy; Cataract extraction (08/06/2010); Cataract extraction w/PHACO (Left, 09/23/2016); and lymphoma removed. Patient denies hx of {redflags:27294}  Exercise history: ***    PAIN: Are you having pain? Yes NPRS: Current: ***/10,  Best: ***/10, Worst: ***/10. Pain location: *** Pain description: *** Aggravating factors: *** Relieving factors: ***   FUNCTIONAL LIMITATIONS: ***  LEISURE: ***  PRECAUTIONS: {Therapy precautions:24002}   WEIGHT BEARING RESTRICTIONS: {Yes ***/No:24003}  FALLS:  Has patient fallen in last 6 months? {fallsyesno:27318}  LIVING ENVIRONMENT: Lives with: {OPRC lives with:25569::"lives with their family"} Lives in: {Lives in:25570} Stairs: {opstairs:27293} Has following equipment at home: {Assistive devices:23999}  OCCUPATION: ***  PLOF: {PLOF:24004}  PATIENT GOALS: ***  NEXT MD VISIT:  ***  OBJECTIVE  DIAGNOSTIC FINDINGS:  No Relevant in chart.   SELF-REPORTED FUNCTION Modified Oswestry Disability Index (mODI): ***% (range 0-100%)  OBSERVATION/INSPECTION Posture Posture (seated): forward head, rounded shoulders, slumped in sitting.  Posture (standing): *** Posture correction: *** Anthropometrics Tremor: none Body composition: *** Muscle bulk: *** Skin: The incision sites appear to be healing well with no excessive redness, warmth, drainage or signs of infection present.  *** Edema: *** Functional Mobility Bed mobility: *** Transfers: *** Gait: grossly WFL for household and short community ambulation. More detailed gait analysis deferred to later date as needed. *** Stairs: ***  SPINE MOTION  LUMBAR SPINE AROM *Indicates pain Flexion: *** Extension: *** Side Flexion:   R ***  L *** Rotation:  R *** L *** Side glide:  R *** L ***    NEUROLOGICAL  Upper Motor Neuron Screen Babinski, Hoffman's and Clonus (ankle) negative bilaterally.  Dermatomes C2-T1 appears equal and intact to light touch except the following: *** L2-S2 appears equal and intact to light touch except the following: *** Deep Tendon Reflexes R/L  ***+/***+ Biceps brachii reflex (C5, C6) ***+/***+ Brachioradialis reflex (C6) ***+/***+ Triceps brachii reflex (C7) ***+/***+ Quadriceps reflex (L4) ***+/***+ Achilles reflex (S1)  SPINE MOTION  CERVICAL SPINE AROM *Indicates pain Flexion: *** Extension: *** Side Flexion:   R ***  L *** Rotation:  R *** L *** Protrusion: *** Retraction: ***   PERIPHERAL JOINT MOTION (in degrees)  ACTIVE RANGE OF MOTION (AROM) *Indicates pain Date Date Date  Joint/Motion R/L R/L  R/L  Shoulder     Flexion / / /  Extension / / /  Abduction  / / /  External rotation / / /  Internal rotation / / /  Elbow     Flexion  / / /  Extension  / / /  Wrist     Flexion / / /  Extension  / / /  Radial deviation / / /  Ulnar  deviation / / /  Pronation / / /  Supination / / /  Hip     Flexion / / /  Extension  / / /  Abduction / / /  Adduction / / /  External rotation / / /  Internal rotation  / / /  Knee     Extension / / /  Flexoin / / /  Ankle/Foot     Dorsiflexion (knee ext) / / /  Dorsiflexion (knee flex) / / /  Plantarflexion / / /  Everison / / /  Inversion / / /  Great toe extension / / /  Great toe flexion / / /  Comments:   PASSIVE RANGE OF MOTION (PROM) *Indicates pain Date Date Date  Joint/Motion R/L R/L R/L  Shoulder     Flexion / / /  Extension / / /  Abduction  / / /  External rotation / / /  Internal rotation / / /  Elbow     Flexion  / / /  Extension  / / /  Wrist     Flexion / / /  Extension  / / /  Radial deviation / / /  Ulnar deviation / / /  Pronation / / /  Supination / / /  Hip     Flexion  / / /  Extension  / / /  Abduction / / /  Adduction / / /  External rotation / / /  Internal rotation  / / /  Knee     Extension / / /  Flexion / / /  Ankle/Foot     Dorsiflexion (knee ext) / / /  Dorsiflexion (knee flex) / / /  Plantarflexion / / /  Everison / / /  Inversion / / /  Great toe extension / / /  Great toe flexion / / /  Comments:   MUSCLE PERFORMANCE (MMT):  *Indicates pain Date Date Date  Joint/Motion R/L R/L R/L  Shoulder     Flexion / / /  Abduction (C5) / / /  External rotation / / /  Internal rotation / / /  Extension / / /  Periscapular     Upper Trap / / /  Middle Trap / / /  Lower Trap / / /  Elbow     Flexion (C6) / / /  Extension (C7) / / /  Wrist     Flexion (C7) / / /  Extension (C6) / / /  Radial deviation / / /  Ulnar deviation (C8) / / /  Pronation / / /  Supination / / /  Hand     Thumb extension (C8) / / /  Finger abduction (T1) / / /  Grip (C8) / / /  Hip     Flexion (L1, L2) / / /  Extension (knee ext) / / /  Extension (knee flex) / / /  Abduction / / /  Adduction / / /  External rotation / / /  Internal rotation  / / /  Knee     Extension (L3) / / /  Flexion (S2) / / /  Ankle/Foot     Dorsiflexion (L4) / / /  Great toe extension (L5) / / /  Eversion (S1) / / /  Plantarflexion (S1) / / /  Inversion / / /  Pronation / / /  Great toe flexion / / /  Comments:   SPECIAL TESTS:  .Neurodynamictests .NeurodynamicUE .NeurodynamicLE .CspineInstability .CSPINESPECIALTESTS .SHOULDERSPECIALTESTCLUSTERS .HIPSPECIALTESTS .SIJSPECIALTESTS   SHOULDER SPECIAL TESTS RTC, Impingement, Anterior Instability (macrotrauma), Labral Tear: Painful arc test: R = ***, L = ***. Drop arm test: R = ***, L = ***. Hawkins-Kennedy test: R = ***, L = ***. Infraspinatus test: R = ***, L = ***. Apprehension test: R = ***, L = ***. Relocation test: R = ***, L = ***. Active compression test: R = ***, L = ***.  ACCESSORY MOTION: ***  PALPATION: ***  SUSTAINED POSITIONS TESTING:  ***  REPEATED MOTIONS TESTING: ***  FUNCTIONAL/BALANCE TESTS: Five Time Sit to Stand (5TSTS): *** seconds Functional Gait Assessment (FGA): ***/30 (see details above) Ten meter walking trial ( ): *** m/s Six Minute Walk Test ( ): *** feet Timed Up and Go (TUG): *** seconds   Dynamic Gait Index: ***/24 BERG Balance Scale: ***/56 Tinetti/POMA: ***/28 Timed Up and GO: *** seconds (average of 3 trials) Trial 1: *** Trial 2: *** Trial 3: *** Romberg test: -Narrow stance, eyes open: *** seconds -Narrow stance, eyes closed: *** seconds Sharpened Romberg test: -Tandem stance, eyes open: *** seconds -Tandem stance, eyes closed: *** seconds  Narrow stance, firm surface, eyes open: *** seconds Narrow stance, firm surface, eyes closed: *** seconds Narrow stance, compliant surface, eyes open: *** seconds Narrow stance, compliant surface, eyes closed: *** seconds Single leg stance, firm surface, eyes open: R= *** seconds, L= *** seconds Single leg stance, compliant surface, eyes open: R= *** seconds, L=  *** seconds Gait speed: *** m/s Functional reach test: *** inches      TREATMENT                                                                                                                               PATIENT EDUCATION:  Education details: *** Person educated: {Person educated:25204} Education method: {Education Method:25205} Education comprehension: {Education Comprehension:25206}  HOME EXERCISE PROGRAM: ***  ASSESSMENT:  CLINICAL IMPRESSION: Patient is a 83 y.o. male referred to outpatient physical therapy with a medical diagnosis of back spasm who presents with signs and symptoms consistent with ***. Patient presents with significant *** impairments that are limiting ability to complete *** without difficulty. Patient will benefit from skilled physical therapy intervention to address current body structure impairments and activity limitations to improve function and work towards goals set in current POC in order to return to prior level of function or maximal functional improvement.    OBJECTIVE IMPAIRMENTS: {opptimpairments:25111}.   ACTIVITY LIMITATIONS: {activitylimitations:27494}  PARTICIPATION  LIMITATIONS: {participationrestrictions:25113}  PERSONAL FACTORS: {Personal factors:25162} are also affecting patient's functional outcome.   REHAB POTENTIAL: {rehabpotential:25112}  CLINICAL DECISION MAKING: {clinical decision making:25114}  EVALUATION COMPLEXITY: {Evaluation complexity:25115}   GOALS: Goals reviewed with patient? {yes/no:20286}  SHORT TERM GOALS: Target date: 12/27/2023  Patient will be independent with initial home exercise program for self-management of symptoms. Baseline: {HEPbaseline4:27310} (12/13/23); Goal status: INITIAL  Patient will demonstrate improvement in Patient Specific Functional Scale (PSFS) of equal or greater than 3 points to reflect clinically significant improvement in patient's most valued functional activities.. Baseline:  {Sarasgoalbaseline:32234} (12/13/23); Goal status: INITIAL  3. Patient will demonstrate improvement in {SarasSTGPRO:32232} to reflect clinically significant improvement in overall condition and self-reported functional ability. Baseline: {Sarasgoalbaseline:32234} (12/13/23); Goal status: INITIAL  4. Patient will report improvement in NPRS of equal or greater than 2 points during functional activities to improve their abilitly to complete community, work and/or recreational activities with less limitation. Baseline: ***/10 (12/13/23); Goal status: INITIAL   LONG TERM GOALS: Target date: 03/06/2024  Patient will be independent with a long-term home exercise program for self-management of symptoms.  Baseline: {HEPbaseline4:27310} (12/13/23); Goal status: INITIAL  2.  Patient will demonstrate improved {SarasLTGPRO:32233} to demonstrate improvement in overall condition and self-reported functional ability.  Baseline: {Sarasgoalbaseline:32234} (12/13/23); Goal status: INITIAL  3.  *** Baseline: {Sarasgoalbaseline:32234} (12/13/23); Goal status: INITIAL  4.  *** Baseline: {Sarasgoalbaseline:32234} (12/13/23); Goal status: INITIAL  5.  Patient will demonstrate improvement in Patient Specific Functional Scale (PSFS) of equal or greater than 8/10 points to reflect clinically significant improvement in patient's most valued functional activities. Baseline: {Sarasgoalbaseline:32234} (12/13/23); Goal status: INITIAL  6.  Patient will report NPRS equal or less than 3/10 during functional activities during the last 2 weeks to improve their abilitly to complete community, work and/or recreational activities with less limitation. Baseline: ***/10 (12/13/23); Goal status: INITIAL   PLAN:  PT FREQUENCY: {rehab frequency:25116}  PT DURATION: {rehab duration:25117}  PLANNED INTERVENTIONS: {rehab planned interventions:25118::"97110-Therapeutic exercises","97530- Therapeutic [phone]-  Neuromuscular re-education","97535- Self AOZH","08657- Manual therapy"}.  PLAN FOR NEXT SESSION: ***  Carilyn Charles. Artemio Larry, PT, DPT 12/13/23, 7:57 PM  [facility] [address] [place], [place] [address] P: [phone] I F: [phone]

## 2023-12-14 ENCOUNTER — Ambulatory Visit: Attending: Family Medicine | Admitting: Physical Therapy

## 2023-12-14 ENCOUNTER — Encounter: Payer: Self-pay | Admitting: Physical Therapy

## 2023-12-14 DIAGNOSIS — M5459 Other low back pain: Secondary | ICD-10-CM | POA: Insufficient documentation

## 2023-12-14 DIAGNOSIS — M62830 Muscle spasm of back: Secondary | ICD-10-CM | POA: Insufficient documentation

## 2023-12-20 ENCOUNTER — Ambulatory Visit: Admitting: Physical Therapy

## 2023-12-22 ENCOUNTER — Ambulatory Visit: Admitting: Physical Therapy

## 2023-12-27 ENCOUNTER — Encounter: Admitting: Physical Therapy

## 2023-12-29 ENCOUNTER — Encounter: Admitting: Physical Therapy

## 2024-01-04 ENCOUNTER — Encounter: Admitting: Physical Therapy

## 2024-01-10 ENCOUNTER — Encounter: Admitting: Physical Therapy

## 2024-01-12 ENCOUNTER — Encounter: Admitting: Physical Therapy

## 2024-01-18 ENCOUNTER — Encounter: Admitting: Physical Therapy

## 2024-01-20 DIAGNOSIS — C8191 Hodgkin lymphoma, unspecified, lymph nodes of head, face, and neck: Secondary | ICD-10-CM | POA: Diagnosis not present

## 2024-01-24 ENCOUNTER — Ambulatory Visit: Admitting: Family Medicine

## 2024-01-24 ENCOUNTER — Encounter: Admitting: Physical Therapy

## 2024-01-24 NOTE — Progress Notes (Deleted)
 Established patient visit   Patient: Elijah Peters   DOB: 29-Sep-1940   83 y.o. Male  MRN: [id number] Visit Date: 01/24/2024  Today's healthcare provider: Mimi Alt, MD   No chief complaint on file.  Subjective       Discussed the use of AI scribe software for clinical note transcription with the patient, who gave verbal consent to proceed.  History of Present Illness      Past Medical History:  Diagnosis Date   Actinic keratosis 01/12/2018   Right forehead. EDC   CAD (coronary artery disease)    Cancer (HCC)    LYMPHOMA/ TUMOR LEFT SINUS   Cataract    GERD (gastroesophageal reflux disease)    Heart murmur    History of nuclear stress test 06/2012   normal pattern of perfusion; low risk scan   Hyperlipidemia    Hypertension    LVH (left ventricular hypertrophy)    mild, concentric   Mild aortic sclerosis    Myocardial infarction (HCC) 08/1996   non-Q-wave inferolateral    Non Hodgkin's lymphoma (HCC)    Peyronie's disease    Prostate cancer (HCC)    Squamous cell carcinoma in situ 07/10/2018   L lat base of neck, lat edge    Squamous cell carcinoma of skin 09/16/2022   left mid forearm, needs EDC 10/26/2022    Medications: Outpatient Medications Prior to Visit  Medication Sig   Ascorbic Acid (VITAMIN C) 500 MG CAPS Take by mouth.   aspirin 81 MG tablet Take 81 mg by mouth daily.    atorvastatin  (LIPITOR) 80 MG tablet Take 1 tablet (80 mg total) by mouth daily.   B COMPLEX VITAMINS PO Take 1 tablet by mouth daily as needed (energy).   clobetasol  (TEMOVATE ) 0.05 % external solution Apply 1 Application topically 2 (two) times daily. Mix bottle in 1 tub of cerave cream as directed, bid aa itchy rash on back, chest and abdomen for up to 4 weeks, avoid face, groin, axilla   COD LIVER OIL PO Take by mouth. 2 tablets daily   ezetimibe  (ZETIA ) 10 MG tablet Take 1 tablet (10 mg total) by mouth at bedtime.   fluticasone  (FLONASE ) 50 MCG/ACT  nasal spray Place 2 sprays into both nostrils daily.   icosapent  Ethyl (VASCEPA ) 1 g capsule Take 2 capsules (2 g total) by mouth 2 (two) times daily.   metoprolol  succinate (TOPROL -XL) 50 MG 24 hr tablet Alternate taking 50 mg (1 tablet) daily and 25 mg (1/2 tablet) daily   Omega-3 Fatty Acids (FISH OIL PO) Take by mouth.   Potassium 99 MG TABS Take 1 tablet by mouth daily as needed (cramps).   ramipril  (ALTACE ) 10 MG capsule Take 1 capsule (10 mg total) by mouth 2 (two) times daily.   tiZANidine  (ZANAFLEX ) 4 MG tablet Take 1 tablet (4 mg total) by mouth every 6 (six) hours as needed for muscle spasms.   No facility-administered medications prior to visit.    Review of Systems  {Insert previous labs (optional):23779} {See past labs  Heme  Chem  Endocrine  Serology  Results Review (optional):1}   Objective    There were no vitals taken for this visit. {Insert last BP/Wt (optional):23777}{See vitals history (optional):1}    Physical Exam  ***  No results found for any visits on 01/24/24.  Assessment & Plan     Problem List Items Addressed This Visit   None   Assessment and Plan Assessment & Plan  No follow-ups on file.         Mimi Alt, MD  [facility] [phone] (phone) [phone] (fax)  [facility]

## 2024-01-26 ENCOUNTER — Encounter: Admitting: Physical Therapy

## 2024-01-31 ENCOUNTER — Encounter: Admitting: Physical Therapy

## 2024-02-02 ENCOUNTER — Encounter: Admitting: Physical Therapy

## 2024-02-08 DIAGNOSIS — H35371 Puckering of macula, right eye: Secondary | ICD-10-CM | POA: Diagnosis not present

## 2024-02-08 DIAGNOSIS — Z961 Presence of intraocular lens: Secondary | ICD-10-CM | POA: Diagnosis not present

## 2024-02-08 DIAGNOSIS — H26493 Other secondary cataract, bilateral: Secondary | ICD-10-CM | POA: Diagnosis not present

## 2024-02-22 DIAGNOSIS — H26491 Other secondary cataract, right eye: Secondary | ICD-10-CM | POA: Diagnosis not present

## 2024-03-16 DIAGNOSIS — K080 Exfoliation of teeth due to systemic causes: Secondary | ICD-10-CM | POA: Diagnosis not present

## 2024-07-10 ENCOUNTER — Other Ambulatory Visit: Payer: Self-pay | Admitting: Dermatology

## 2024-07-10 ENCOUNTER — Other Ambulatory Visit: Payer: Self-pay | Admitting: Family Medicine

## 2024-07-10 DIAGNOSIS — M62830 Muscle spasm of back: Secondary | ICD-10-CM

## 2024-07-14 NOTE — Progress Notes (Signed)
 Elijah Peters                                          MRN: [id number]   07/14/2024   The [facility] Quality Team Specialist reviewed this patient medical record for the purposes of chart review for care gap closure. The following were reviewed: chart review for care gap closure-controlling blood pressure, no more recent blood pressure than April 2025.    [facility] Quality Team

## 2024-09-08 NOTE — Progress Notes (Signed)
 JAUAN WOHL                                          MRN: [id number]   09/08/2024   The [facility] Quality Team Specialist reviewed this patient medical record for the purposes of chart review for care gap closure. The following were reviewed: chart review for care gap closure-controlling blood pressure.    [facility] Quality Team

## 2024-09-12 ENCOUNTER — Encounter: Payer: Medicare Other | Admitting: Dermatology

## 2024-09-14 ENCOUNTER — Encounter: Payer: Self-pay | Admitting: Dermatology

## 2024-09-14 ENCOUNTER — Ambulatory Visit: Admitting: Dermatology

## 2024-09-14 DIAGNOSIS — L814 Other melanin hyperpigmentation: Secondary | ICD-10-CM

## 2024-09-14 DIAGNOSIS — L853 Xerosis cutis: Secondary | ICD-10-CM

## 2024-09-14 DIAGNOSIS — L720 Epidermal cyst: Secondary | ICD-10-CM

## 2024-09-14 DIAGNOSIS — L729 Follicular cyst of the skin and subcutaneous tissue, unspecified: Secondary | ICD-10-CM

## 2024-09-14 DIAGNOSIS — L570 Actinic keratosis: Secondary | ICD-10-CM

## 2024-09-14 DIAGNOSIS — L578 Other skin changes due to chronic exposure to nonionizing radiation: Secondary | ICD-10-CM

## 2024-09-14 DIAGNOSIS — W908XXA Exposure to other nonionizing radiation, initial encounter: Secondary | ICD-10-CM | POA: Diagnosis not present

## 2024-09-14 DIAGNOSIS — L111 Transient acantholytic dermatosis [Grover]: Secondary | ICD-10-CM

## 2024-09-14 DIAGNOSIS — Z85828 Personal history of other malignant neoplasm of skin: Secondary | ICD-10-CM

## 2024-09-14 DIAGNOSIS — D1801 Hemangioma of skin and subcutaneous tissue: Secondary | ICD-10-CM

## 2024-09-14 DIAGNOSIS — Z1283 Encounter for screening for malignant neoplasm of skin: Secondary | ICD-10-CM

## 2024-09-14 DIAGNOSIS — D229 Melanocytic nevi, unspecified: Secondary | ICD-10-CM

## 2024-09-14 DIAGNOSIS — L821 Other seborrheic keratosis: Secondary | ICD-10-CM

## 2024-09-14 NOTE — Progress Notes (Signed)
 "  Follow-Up Visit   Subjective  Elijah Peters is a 84 y.o. male who presents for the following: Skin Cancer Screening and Full Body Skin Exam  The patient presents for Total-Body Skin Exam (TBSE) for skin cancer screening and mole check. The patient has spots, moles and lesions to be evaluated, some may be new or changing and the patient may have concern these could be cancer.  Patient with hx of AK, SCC. Patient with dermatitis at chest and abdomen, occasionally uses CeraVe/clobetasol  mix prn.   The following portions of the chart were reviewed this encounter and updated as appropriate: medications, allergies, medical history  Review of Systems:  No other skin or systemic complaints except as noted in HPI or Assessment and Plan.  Objective  Well appearing patient in no apparent distress; mood and affect are within normal limits.  A full examination was performed including scalp, head, eyes, ears, nose, lips, neck, chest, axillae, abdomen, back, buttocks, bilateral upper extremities, bilateral lower extremities, hands, feet, fingers, toes, fingernails, and toenails. All findings within normal limits unless otherwise noted below.   Relevant physical exam findings are noted in the Assessment and Plan.  L hand x 2, R hand x 1, L preauricular x 1, forehead x 3, R temple x 1, R preauricular x 1, R forearm x 1 (10) Erythematous thin papules/macules with gritty scale.   Assessment & Plan   SKIN CANCER SCREENING PERFORMED TODAY.  ACTINIC DAMAGE - Chronic condition, secondary to cumulative UV/sun exposure - diffuse scaly erythematous macules with underlying dyspigmentation - Recommend daily broad spectrum sunscreen SPF 30+ to sun-exposed areas, reapply every 2 hours as needed.  - Staying in the shade or wearing long sleeves, sun glasses (UVA+UVB protection) and wide brim hats (4-inch brim around the entire circumference of the hat) are also recommended for sun protection.  - Call for new  or changing lesions.  LENTIGINES, SEBORRHEIC KERATOSES, HEMANGIOMAS - Benign normal skin lesions - Benign-appearing - Call for any changes  MELANOCYTIC NEVI - Tan-brown and/or pink-flesh-colored symmetric macules and papules - Benign appearing on exam today - Observation - Call clinic for new or changing moles - Recommend daily use of broad spectrum spf 30+ sunscreen to sun-exposed areas.   HISTORY OF SQUAMOUS CELL CARCINOMA OF THE SKIN - No evidence of recurrence today- L mid forearm - Recommend regular full body skin exams - Recommend daily broad spectrum sunscreen SPF 30+ to sun-exposed areas, reapply every 2 hours as needed.  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 Condition is improving with treatment but not currently at goal.    Treatment Plan: Continue Clobetasol Orlie cr twice daily aa rash back, chest, abdomen for up to 4 weeks when flared, avoid f/g/a Start Cerave cream qd as moisturizer once no longer using medicated mixture   Topical steroids (such as triamcinolone, fluocinolone, fluocinonide, mometasone, clobetasol , halobetasol, betamethasone, hydrocortisone ) can cause thinning and lightening of the skin if they are used for too long in the same area. Your physician has selected the right strength medicine for your problem and area affected on the body. Please use your medication only as directed by your physician to prevent side effects.    EPIDERMAL INCLUSION CYST Exam: firm small nodule at left lateral eye  Benign-appearing. Exam most consistent with  an epidermal inclusion cyst. Discussed that a cyst is a benign growth that can grow over time and sometimes get irritated or inflamed. Recommend observation if it is not bothersome. Discussed option of surgical excision to  remove it if it is growing, symptomatic, or other changes noted. Please call for new or changing lesions so they can be evaluated.  Xerosis - diffuse xerotic patches at feet - recommend gentle, hydrating skin care Recommend starting moisturizer with exfoliant (Urea, Salicylic acid, or Lactic acid) one to two times daily to help smooth rough and bumpy skin.  OTC options include Cetaphil Rough and Bumpy lotion (Urea), Eucerin Roughness Relief lotion or spot treatment cream (Urea), CeraVe SA lotion/cream for Rough and Bumpy skin (Sal Acid), Gold Bond Rough and Bumpy cream (Sal Acid), and AmLactin 12% lotion/cream (Lactic Acid).  If applying in morning, also apply sunscreen to sun-exposed areas, since these exfoliating moisturizers can increase sensitivity to sun.    AK (ACTINIC KERATOSIS) (10) L hand x 2, R hand x 1, L preauricular x 1, forehead x 3, R temple x 1, R preauricular x 1, R forearm x 1 (10) Actinic keratoses are precancerous spots that appear secondary to cumulative UV radiation exposure/sun exposure over time. They are chronic with expected duration over 1 year. A portion of actinic keratoses will progress to squamous cell carcinoma of the skin. It is not possible to reliably predict which spots will progress to skin cancer and so treatment is recommended to prevent development of skin cancer.  Recommend daily broad spectrum sunscreen SPF 30+ to sun-exposed areas, reapply every 2 hours as needed.  Recommend staying in the shade or wearing long sleeves, sun glasses (UVA+UVB protection) and wide brim hats (4-inch brim around the entire circumference of the hat). Call for new or changing lesions. - Destruction of lesion - L hand x 2, R hand x 1, L preauricular x 1, forehead x 3, R temple x 1, R preauricular x 1, R forearm x 1 (10) Complexity: simple   Destruction method: cryotherapy   Informed consent: discussed and consent obtained   Lesion destroyed using liquid nitrogen: Yes   Region  frozen until ice ball extended beyond lesion: Yes   Outcome: patient tolerated procedure well with no complications   Post-procedure details: wound care instructions given    Return in about 1 year (around 09/14/2025) for TBSE, with Dr. Jackquline, HxAK, HxSCC.  LILLETTE Lonell Drones, RMA, am acting as scribe for Rexene Jackquline, MD .   Documentation: I have reviewed the above documentation for accuracy and completeness, and I agree with the above.  Rexene Jackquline, MD   "

## 2024-09-14 NOTE — Patient Instructions (Addendum)
 Recommend starting moisturizer with exfoliant (Urea, Salicylic acid, or Lactic acid) one to two times daily to help smooth rough and bumpy skin.  OTC options include Cetaphil Rough and Bumpy lotion (Urea), Eucerin Roughness Relief lotion or spot treatment cream (Urea), CeraVe SA lotion/cream for Rough and Bumpy skin (Sal Acid), Gold Bond Rough and Bumpy cream (Sal Acid), and AmLactin 12% lotion/cream (Lactic Acid).  If applying in morning, also apply sunscreen to sun-exposed areas, since these exfoliating moisturizers can increase sensitivity to sun.  Apply lamisil (terbinafine) cream twice a day until rash is clear and for one week after.     Melanoma ABCDEs  Melanoma is the most dangerous type of skin cancer, and is the leading cause of death from skin disease.  You are more likely to develop melanoma if you: Have light-colored skin, light-colored eyes, or red or blond hair Spend a lot of time in the sun Tan regularly, either outdoors or in a tanning bed Have had blistering sunburns, especially during childhood Have a close family member who has had a melanoma Have atypical moles or large birthmarks  Early detection of melanoma is key since treatment is typically straightforward and cure rates are extremely high if we catch it early.   The first sign of melanoma is often a change in a mole or a new dark spot.  The ABCDE system is a way of remembering the signs of melanoma.  A for asymmetry:  The two halves do not match. B for border:  The edges of the growth are irregular. C for color:  A mixture of colors are present instead of an even brown color. D for diameter:  Melanomas are usually (but not always) greater than 6mm - the size of a pencil eraser. E for evolution:  The spot keeps changing in size, shape, and color.  Please check your skin once per month between visits. You can use a small mirror in front and a large mirror behind you to keep an eye on the back side or your body.    If you see any new or changing lesions before your next follow-up, please call to schedule a visit.  Please continue daily skin protection including broad spectrum sunscreen SPF 30+ to sun-exposed areas, reapplying every 2 hours as needed when you're outdoors.    Due to recent changes in healthcare laws, you may see results of your pathology and/or laboratory studies on MyChart before the doctors have had a chance to review them. We understand that in some cases there may be results that are confusing or concerning to you. Please understand that not all results are received at the same time and often the doctors may need to interpret multiple results in order to provide you with the best plan of care or course of treatment. Therefore, we ask that you please give us  2 business days to thoroughly review all your results before contacting the office for clarification. Should we see a critical lab result, you will be contacted sooner.   If You Need Anything After Your Visit  If you have any questions or concerns for your doctor, please call our main line at [phone] and press option 4 to reach your doctor's medical assistant. If no one answers, please leave a voicemail as directed and we will return your call as soon as possible. Messages left after 4 pm will be answered the following business day.   You may also send us  a message via MyChart. We typically respond to  MyChart messages within 1-2 business days.  For prescription refills, please ask your pharmacy to contact our office. Our fax number is [phone].  If you have an urgent issue when the clinic is closed that cannot wait until the next business day, you can page your doctor at the number below.    Please note that while we do our best to be available for urgent issues outside of office hours, we are not available 24/7.   If you have an urgent issue and are unable to reach us , you may choose to seek medical care at your doctor's  office, retail clinic, urgent care center, or emergency room.  If you have a medical emergency, please immediately call 911 or go to the emergency department.  Pager Numbers  - Dr. Hester: [phone]  - Dr. Jackquline: [phone]  - Dr. Claudene: [phone]   - Dr. Raymund: [phone]  In the event of inclement weather, please call our main line at [phone] for an update on the status of any delays or closures.  Dermatology Medication Tips: Please keep the boxes that topical medications come in in order to help keep track of the instructions about where and how to use these. Pharmacies typically print the medication instructions only on the boxes and not directly on the medication tubes.   If your medication is too expensive, please contact our office at [phone] option 4 or send us  a message through MyChart.   We are unable to tell what your co-pay for medications will be in advance as this is different depending on your insurance coverage. However, we may be able to find a substitute medication at lower cost or fill out paperwork to get insurance to cover a needed medication.   If a prior authorization is required to get your medication covered by your insurance company, please allow us  1-2 business days to complete this process.  Drug prices often vary depending on where the prescription is filled and some pharmacies may offer cheaper prices.  The website www.goodrx.com contains coupons for medications through different pharmacies. The prices here do not account for what the cost may be with help from insurance (it may be cheaper with your insurance), but the website can give you the price if you did not use any insurance.  - You can print the associated coupon and take it with your prescription to the pharmacy.  - You may also stop by our office during regular business hours and pick up a GoodRx coupon card.  - If you need your prescription sent electronically to a  different pharmacy, notify our office through [facility] or by phone at [phone] option 4.     Si Usted Necesita Algo Despus de Su Visita  Tambin puede enviarnos un mensaje a travs de Clinical Cytogeneticist. Por lo general respondemos a los mensajes de MyChart en el transcurso de 1 a 2 das hbiles.  Para renovar recetas, por favor pida a su farmacia que se ponga en contacto con nuestra oficina. Randi lakes de fax es [place] [phone].  Si tiene un asunto urgente cuando la clnica est cerrada y que no puede esperar hasta el siguiente da hbil, puede llamar/localizar a su doctor(a) al nmero que aparece a continuacin.   Por favor, tenga en cuenta que aunque hacemos todo lo posible para estar disponibles para asuntos urgentes fuera del horario de [place], no estamos disponibles las 24 horas del da, los 7 [address]  [address] de la [place].   Si tiene un problema urgente y  no puede comunicarse con nosotros, puede optar por buscar atencin mdica  en el consultorio de su doctor(a), en una clnica privada, en un centro de atencin urgente o en una sala de emergencias.  Si tiene engineer, drilling, por favor llame inmediatamente al 911 o vaya a la sala de emergencias.  Nmeros de bper  - Dr. Hester: [phone]  - Dra. Jackquline: [phone]  - Dr. Claudene: [phone]  - Dra. Kitts: [phone]  En caso de inclemencias del [place], por favor llame a nuestra lnea principal al [phone] para una actualizacin sobre el estado de cualquier retraso o cierre.  Consejos para la medicacin en dermatologa: Por favor, guarde las cajas en las que vienen los medicamentos de uso tpico para ayudarle a seguir las instrucciones sobre dnde y cmo usarlos. Las farmacias generalmente imprimen las instrucciones del medicamento slo en las cajas y no directamente en los tubos del [place].   Si su medicamento es muy caro, por favor, pngase en contacto con landry rieger llamando al [phone] y  presione la opcin 4 o envenos un mensaje a travs de Clinical Cytogeneticist.   No podemos decirle cul ser su copago por los medicamentos por adelantado ya que esto es diferente dependiendo de la cobertura de su seguro. Sin embargo, es posible que podamos encontrar un medicamento sustituto a audiological scientist un formulario para que el seguro cubra el medicamento que se considera necesario.   Si se requiere una autorizacin previa para que su compaa de seguros cubra su medicamento, por favor permtanos de 1 a 2 das hbiles para completar este proceso.  Los precios de los medicamentos varan con frecuencia dependiendo del environmental consultant de dnde se surte la receta y alguna farmacias pueden ofrecer precios ms baratos.  El sitio web www.goodrx.com tiene cupones para medicamentos de health and safety inspector. Los precios aqu no tienen en cuenta lo que podra costar con la ayuda del seguro (puede ser ms barato con su seguro), pero el sitio web puede darle el precio si no utiliz tourist information centre manager.  - Puede imprimir el cupn correspondiente y llevarlo con su receta a la farmacia.  - Tambin puede pasar por nuestra oficina durante el horario de atencin regular y education officer, museum una tarjeta de cupones de GoodRx.  - Si necesita que su receta se enve electrnicamente a una farmacia diferente, informe a nuestra oficina a travs de MyChart de  o por telfono llamando al [phone] y presione la opcin 4.

## 2025-09-18 ENCOUNTER — Encounter: Admitting: Dermatology
